# Patient Record
Sex: Female | Born: 1990 | Race: Black or African American | Hispanic: No | Marital: Single | Smoking: Never smoker
Health system: Southern US, Community
[De-identification: ages and names within clinical notes are randomized; demographics above are authoritative.]

## PROBLEM LIST (undated history)

## (undated) ENCOUNTER — Inpatient Hospital Stay (HOSPITAL_COMMUNITY): Payer: Self-pay

## (undated) ENCOUNTER — Emergency Department (HOSPITAL_COMMUNITY): Payer: Medicaid Other

## (undated) DIAGNOSIS — N898 Other specified noninflammatory disorders of vagina: Secondary | ICD-10-CM

## (undated) DIAGNOSIS — O0990 Supervision of high risk pregnancy, unspecified, unspecified trimester: Secondary | ICD-10-CM

## (undated) DIAGNOSIS — D649 Anemia, unspecified: Secondary | ICD-10-CM

## (undated) DIAGNOSIS — Z789 Other specified health status: Secondary | ICD-10-CM

## (undated) DIAGNOSIS — N390 Urinary tract infection, site not specified: Secondary | ICD-10-CM

## (undated) HISTORY — PX: NO PAST SURGERIES: SHX2092

## (undated) HISTORY — DX: Other specified noninflammatory disorders of vagina: N89.8

---
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
## 2012-03-08 NOTE — L&D Delivery Note (Signed)
Delivery Note At 3:40 PM a viable female was delivered via Vaginal, Spontaneous Delivery (Presentation: Left Occiput Anterior).  APGAR: 7, 9; weight 3 lb 15.9 oz (1810 g).   Placenta status: Intact, Spontaneous.  Cord: 3 vessels with the following complications: .  Anesthesia: None  Episiotomy: None Lacerations: None Suture Repair: none Est. Blood Loss (mL): 400  NSVD of viable preterm infant over intact perineum. Active management of 3rd stage with intact placenta and 3v cord. No tears no repair required. Hemostatic. EBL 400. Concern for possible abruption.  Mom to [facility] 3rd floor.  Baby to NICU.  Tawana Scale 11/24/2012, 5:12 PM

## 2012-07-22 ENCOUNTER — Inpatient Hospital Stay (HOSPITAL_COMMUNITY): Payer: Self-pay

## 2012-07-22 ENCOUNTER — Inpatient Hospital Stay (HOSPITAL_COMMUNITY)
Admission: AD | Admit: 2012-07-22 | Discharge: 2012-07-22 | Disposition: A | Discharge status: Home / Self Care | Payer: Medicaid Other | Source: Ambulatory Visit | Attending: Obstetrics & Gynecology | Admitting: Obstetrics & Gynecology

## 2012-07-22 ENCOUNTER — Encounter (HOSPITAL_COMMUNITY): Payer: Self-pay | Admitting: *Deleted

## 2012-07-22 DIAGNOSIS — B9689 Other specified bacterial agents as the cause of diseases classified elsewhere: Secondary | ICD-10-CM | POA: Insufficient documentation

## 2012-07-22 DIAGNOSIS — O4692 Antepartum hemorrhage, unspecified, second trimester: Secondary | ICD-10-CM

## 2012-07-22 DIAGNOSIS — N760 Acute vaginitis: Secondary | ICD-10-CM | POA: Insufficient documentation

## 2012-07-22 DIAGNOSIS — A499 Bacterial infection, unspecified: Secondary | ICD-10-CM

## 2012-07-22 DIAGNOSIS — O2390 Unspecified genitourinary tract infection in pregnancy, unspecified trimester: Secondary | ICD-10-CM | POA: Insufficient documentation

## 2012-07-22 DIAGNOSIS — O209 Hemorrhage in early pregnancy, unspecified: Secondary | ICD-10-CM | POA: Insufficient documentation

## 2012-07-22 LAB — URINALYSIS, ROUTINE W REFLEX MICROSCOPIC
Bilirubin Urine: NEGATIVE
Protein, ur: NEGATIVE mg/dL
Urobilinogen, UA: 0.2 mg/dL (ref 0.0–1.0)

## 2012-07-22 LAB — URINE MICROSCOPIC-ADD ON

## 2012-07-22 LAB — WET PREP, GENITAL
Trich, Wet Prep: NONE SEEN
Yeast Wet Prep HPF POC: NONE SEEN

## 2012-07-22 MED ORDER — METRONIDAZOLE 500 MG PO TABS: 500.0000 mg | ORAL_TABLET | Freq: Two times a day (BID) | ORAL | Status: DC

## 2012-07-22 NOTE — MAU Provider Note (Signed)
History     CSN: [id number]  Arrival date & time 07/22/12  1417   None     Chief Complaint  Patient presents with  . Vaginal Bleeding    (Consider location/radiation/quality/duration/timing/severity/associated sxs/prior treatment) HPI [name] is a 22 y.o. G2P0010 at [redacted]w[redacted]d. She presents with c/o bleeding since last night. Is right red, on tissue with wiping, not enough for a pad. No clots, no cramping. No other changes in discharge, odor or itching. No UTI S&S.  Last intercourse 5/15, same partner x 7 m, no STD screen yet. Has applied for Medicaid,waiting for her card. Plans [facility] with Dr Pennie Rushing.  Past Medical History  Diagnosis Date  . No pertinent past medical history     Past Surgical History  Procedure Laterality Date  . No past surgeries      History reviewed. No pertinent family history.  History  Substance Use Topics  . Smoking status: Never Smoker   . Smokeless tobacco: Not on file  . Alcohol Use: No    OB History   Grav Para Term Preterm Abortions TAB SAB Ect Mult Living   2 0 0 0 1 0 1 0 0 0       Review of Systems  Constitutional: Negative for fever and chills.  Gastrointestinal: Negative for nausea, vomiting and abdominal pain.  Genitourinary: Positive for vaginal bleeding. Negative for dysuria, urgency, frequency and vaginal discharge.    Allergies  Review of patient's allergies indicates no known allergies.  Home Medications  No current outpatient prescriptions on file.  BP 121/68  Pulse 92  Temp(Src) 97.6 F (36.4 C) (Oral)  Resp 18  Ht 5\' 2"  (1.575 m)  Wt 100 lb (45.36 kg)  BMI 18.29 kg/m2  LMP 04/04/2012  Physical Exam  Constitutional: She is oriented to person, place, and time. She appears well-developed and well-nourished.  Abdominal: Soft. There is no tenderness.  Genitourinary:  Pelvic exam: Vulva- nl anatomy, skin intact Vagina- mod amt rose colored thick discharge with odor Cx- closed Uterus- 14-16 wk size Adn- non  tender  Musculoskeletal: Normal range of motion.  Neurological: She is alert and oriented to person, place, and time.  Skin: Skin is warm and dry.  Psychiatric: She has a normal mood and affect. Her behavior is normal.    ED Course  Procedures (including critical care time)  Labs Reviewed  URINALYSIS, ROUTINE W REFLEX MICROSCOPIC - Abnormal; Notable for the following:    Hgb urine dipstick LARGE (*)    All other components within normal limits  URINE MICROSCOPIC-ADD ON - Abnormal; Notable for the following:    Squamous Epithelial / LPF FEW (*)    Bacteria, UA MANY (*)    All other components within normal limits  URINE CULTURE  WET PREP, GENITAL  GC/CHLAMYDIA PROBE AMP   No results found.  Results for orders placed during the hospital encounter of 07/22/12 (from the past 24 hour(s))  URINALYSIS, ROUTINE W REFLEX MICROSCOPIC     Status: Abnormal   Collection Time    07/22/12  2:35 PM      Result Value Range   Color, Urine YELLOW  YELLOW   APPearance CLEAR  CLEAR   Specific Gravity, Urine 1.015  1.005 - 1.030   pH 8.0  5.0 - 8.0   Glucose, UA NEGATIVE  NEGATIVE mg/dL   Hgb urine dipstick LARGE (*) NEGATIVE   Bilirubin Urine NEGATIVE  NEGATIVE   Ketones, ur NEGATIVE  NEGATIVE mg/dL   Protein, ur NEGATIVE  NEGATIVE mg/dL   Urobilinogen, UA 0.2  0.0 - 1.0 mg/dL   Nitrite NEGATIVE  NEGATIVE   Leukocytes, UA NEGATIVE  NEGATIVE  URINE MICROSCOPIC-ADD ON     Status: Abnormal   Collection Time    07/22/12  2:35 PM      Result Value Range   Squamous Epithelial / LPF FEW (*) RARE   WBC, UA 7-10  <3 WBC/hpf   RBC / HPF 3-6  <3 RBC/hpf   Bacteria, UA MANY (*) RARE  WET PREP, GENITAL     Status: Abnormal   Collection Time    07/22/12  3:50 PM      Result Value Range   Yeast Wet Prep HPF POC NONE SEEN  NONE SEEN   Trich, Wet Prep NONE SEEN  NONE SEEN   Clue Cells Wet Prep HPF POC MANY (*) NONE SEEN   WBC, Wet Prep HPF POC FEW (*) NONE SEEN    No diagnosis found.  U/S 16  2/7 wks , EDC 10/30 by measurements. 15 4/7 by dates, [facility] 01/09/13. Anterior placenta, no SCH seen.    MDM  ASSESSMENT: Bleeding @ 15 4/7 wks Bacterial vaginosis Cultures pending  PLAN:  Metronidazole 500 mg bid x 7 d Pelvic rest She will be notified of results Make appt for [facility] when gets Medicaid card

## 2012-07-22 NOTE — MAU Provider Note (Signed)
Attestation of Attending Supervision of Advanced Practitioner (CNM/NP): Evaluation and management procedures were performed by the Advanced Practitioner under my supervision and collaboration.  I have reviewed the Advanced Practitioner's note and chart, and I agree with the management and plan.  HARRAWAY-SMITH, Mahala Rommel 7:02 PM

## 2012-07-22 NOTE — MAU Note (Signed)
Pt presents with complaints of bright red vaginal bleeding since last night around midnight. Pt denies any cramping.

## 2012-07-24 ENCOUNTER — Other Ambulatory Visit: Payer: Self-pay | Admitting: Obstetrics & Gynecology

## 2012-07-24 ENCOUNTER — Telehealth: Payer: Self-pay | Admitting: General Practice

## 2012-07-24 DIAGNOSIS — N390 Urinary tract infection, site not specified: Secondary | ICD-10-CM

## 2012-07-24 LAB — URINE CULTURE: Colony Count: >=100000

## 2012-07-24 LAB — GC/CHLAMYDIA PROBE AMP
CT Probe RNA: NEGATIVE
GC Probe RNA: NEGATIVE

## 2012-07-24 MED ORDER — CEPHALEXIN 500 MG PO CAPS: 500.0000 mg | ORAL_CAPSULE | Freq: Four times a day (QID) | ORAL | Status: DC

## 2012-07-24 NOTE — Telephone Encounter (Signed)
Message copied by Kathee Delton on Mon Jul 24, 2012  4:46 PM ------      Message from: Willodean Rosenthal      Created: Mon Jul 24, 2012  3:21 PM       Pt with UTI.  Please notify.  Needs to pick up Rx.      thx            clh-[id number] ------

## 2012-07-24 NOTE — Telephone Encounter (Signed)
Called patient at home number and person answered saying this was the wrong number, called patient at mobile number and voicemail box said it had not been set up yet

## 2012-07-26 ENCOUNTER — Encounter: Payer: Self-pay | Admitting: General Practice

## 2012-07-26 NOTE — Telephone Encounter (Addendum)
Called patient, no answer on mobile number phone and message stated this voicemail box had not been set up yet. Will send letter.

## 2012-08-13 ENCOUNTER — Encounter (HOSPITAL_COMMUNITY): Payer: Self-pay | Admitting: Family

## 2012-08-13 ENCOUNTER — Inpatient Hospital Stay (HOSPITAL_COMMUNITY)
Admission: AD | Admit: 2012-08-13 | Discharge: 2012-08-13 | Disposition: A | Discharge status: Home / Self Care | Payer: Self-pay | Source: Ambulatory Visit | Attending: Obstetrics & Gynecology | Admitting: Obstetrics & Gynecology

## 2012-08-13 DIAGNOSIS — B373 Candidiasis of vulva and vagina: Secondary | ICD-10-CM | POA: Insufficient documentation

## 2012-08-13 DIAGNOSIS — O2390 Unspecified genitourinary tract infection in pregnancy, unspecified trimester: Secondary | ICD-10-CM | POA: Insufficient documentation

## 2012-08-13 DIAGNOSIS — B3731 Acute candidiasis of vulva and vagina: Secondary | ICD-10-CM | POA: Insufficient documentation

## 2012-08-13 DIAGNOSIS — L293 Anogenital pruritus, unspecified: Secondary | ICD-10-CM | POA: Insufficient documentation

## 2012-08-13 HISTORY — DX: Urinary tract infection, site not specified: N39.0

## 2012-08-13 LAB — URINALYSIS, ROUTINE W REFLEX MICROSCOPIC
Bilirubin Urine: NEGATIVE
Nitrite: NEGATIVE
Protein, ur: NEGATIVE mg/dL
Specific Gravity, Urine: 1.01 (ref 1.005–1.030)
Urobilinogen, UA: 0.2 mg/dL (ref 0.0–1.0)

## 2012-08-13 LAB — URINE MICROSCOPIC-ADD ON

## 2012-08-13 LAB — WET PREP, GENITAL: Trich, Wet Prep: NONE SEEN

## 2012-08-13 MED ORDER — FLUCONAZOLE 150 MG PO TABS: 150.0000 mg | ORAL_TABLET | Freq: Once | ORAL | Status: DC

## 2012-08-13 NOTE — MAU Note (Signed)
Pt presents with complaints of vaginal itching and redness in her vaginal area.

## 2012-08-13 NOTE — MAU Provider Note (Signed)
Attestation of Attending Supervision of Advanced Practitioner (CNM/NP): Evaluation and management procedures were performed by the Advanced Practitioner under my supervision and collaboration.  I have reviewed the Advanced Practitioner's note and chart, and I agree with the management and plan.  HARRAWAY-SMITH, Rayner Erman 5:52 PM      

## 2012-08-13 NOTE — MAU Provider Note (Signed)
History     CSN: [id number]  Arrival date and time: 08/13/12 1411   First Provider Initiated Contact with Patient 08/13/12 1436      Chief Complaint  Patient presents with  . Vaginal Itching   HPI [name] is a 22 y.o. G2P0010 at [redacted]w[redacted]d who presents to MAU today with complaint of vaginal discharge x 1 week. The patient states that it is thick, white and odorless. She also has associated itching and irritation. She denies abdominal pain, vaginal bleeding, contractions, UTI symptoms, fever or N/V. She reports good fetal movement.   OB History   Grav Para Term Preterm Abortions TAB SAB Ect Mult Living   2 0 0 0 1 0 1 0 0 0       Past Medical History  Diagnosis Date  . No pertinent past medical history   . UTI (lower urinary tract infection)     Past Surgical History  Procedure Laterality Date  . No past surgeries      History reviewed. No pertinent family history.  History  Substance Use Topics  . Smoking status: Never Smoker   . Smokeless tobacco: Not on file  . Alcohol Use: No    Allergies: No Known Allergies  No prescriptions prior to admission    Review of Systems  Constitutional: Negative for fever and malaise/fatigue.  Gastrointestinal: Negative for nausea, vomiting, abdominal pain, diarrhea and constipation.  Genitourinary: Negative for dysuria, urgency and frequency.       + vaginal discharge Neg - vaginal bleeding  Neurological: Negative for dizziness.   Physical Exam   Blood pressure 99/69, pulse 91, temperature 97.1 F (36.2 C), temperature source Oral, resp. rate 16, last menstrual period 04/04/2012.  Physical Exam  Constitutional: She is oriented to person, place, and time. She appears well-developed and well-nourished. No distress.  HENT:  Head: Normocephalic and atraumatic.  Cardiovascular: Normal rate, regular rhythm and normal heart sounds.   Respiratory: Effort normal and breath sounds normal. No respiratory distress.  GI:  Soft. Bowel sounds are normal. She exhibits no distension and no mass. There is no tenderness. There is no rebound and no guarding.  Genitourinary: Uterus is enlarged (appropriate for GA). Uterus is not tender. Cervix exhibits no motion tenderness, no discharge and no friability. No bleeding around the vagina. Vaginal discharge (moderate amount of thick white discharge noted) found.  Neurological: She is alert and oriented to person, place, and time.  Skin: Skin is warm and dry. No erythema.  Psychiatric: She has a normal mood and affect.   Results for orders placed during the hospital encounter of 08/13/12 (from the past 24 hour(s))  URINALYSIS, ROUTINE W REFLEX MICROSCOPIC     Status: Abnormal   Collection Time    08/13/12  2:25 PM      Result Value Range   Color, Urine YELLOW  YELLOW   APPearance CLEAR  CLEAR   Specific Gravity, Urine 1.010  1.005 - 1.030   pH 7.0  5.0 - 8.0   Glucose, UA 100 (*) NEGATIVE mg/dL   Hgb urine dipstick NEGATIVE  NEGATIVE   Bilirubin Urine NEGATIVE  NEGATIVE   Ketones, ur NEGATIVE  NEGATIVE mg/dL   Protein, ur NEGATIVE  NEGATIVE mg/dL   Urobilinogen, UA 0.2  0.0 - 1.0 mg/dL   Nitrite NEGATIVE  NEGATIVE   Leukocytes, UA TRACE (*) NEGATIVE  URINE MICROSCOPIC-ADD ON     Status: Abnormal   Collection Time    08/13/12  2:25 PM  Result Value Range   Squamous Epithelial / LPF FEW (*) RARE   WBC, UA 0-2  <3 WBC/hpf  WET PREP, GENITAL     Status: Abnormal   Collection Time    08/13/12  2:40 PM      Result Value Range   Yeast Wet Prep HPF POC FEW (*) NONE SEEN   Trich, Wet Prep NONE SEEN  NONE SEEN   Clue Cells Wet Prep HPF POC NONE SEEN  NONE SEEN   WBC, Wet Prep HPF POC MODERATE (*) NONE SEEN    MAU Course  Procedures None  MDM UA and Wet prep today  Assessment and Plan  A: Yeast vaginitis  P: Discharge home Rx for Diflucan #2 sent to patient's pharmacy Patient encouraged to keep appointment to start care with [facility] when Medicaid is  approved Patient may return to MAU as needed  Freddi Starr, PA-C  08/13/2012, 3:49 PM

## 2012-08-13 NOTE — MAU Note (Addendum)
Patient presents to MAU with c/o vaginal itching and white discharge x 1 week. Denies vaginal bleeding, cramping. Reports +FM.

## 2012-08-14 LAB — GC/CHLAMYDIA PROBE AMP: GC Probe RNA: NEGATIVE

## 2012-08-28 ENCOUNTER — Encounter (HOSPITAL_COMMUNITY): Payer: Self-pay

## 2012-08-28 ENCOUNTER — Inpatient Hospital Stay (HOSPITAL_COMMUNITY)
Admission: AD | Admit: 2012-08-28 | Discharge: 2012-08-28 | Disposition: A | Discharge status: Home / Self Care | Payer: Self-pay | Source: Ambulatory Visit | Attending: Family Medicine | Admitting: Family Medicine

## 2012-08-28 DIAGNOSIS — O99891 Other specified diseases and conditions complicating pregnancy: Secondary | ICD-10-CM | POA: Insufficient documentation

## 2012-08-28 DIAGNOSIS — R109 Unspecified abdominal pain: Secondary | ICD-10-CM | POA: Insufficient documentation

## 2012-08-28 DIAGNOSIS — N949 Unspecified condition associated with female genital organs and menstrual cycle: Secondary | ICD-10-CM

## 2012-08-28 LAB — URINALYSIS, ROUTINE W REFLEX MICROSCOPIC
Glucose, UA: 500 mg/dL — AB
Leukocytes, UA: NEGATIVE
Nitrite: NEGATIVE
Protein, ur: NEGATIVE mg/dL
Urobilinogen, UA: 0.2 mg/dL (ref 0.0–1.0)

## 2012-08-28 MED ORDER — CYCLOBENZAPRINE HCL 10 MG PO TABS: 10.0000 mg | ORAL_TABLET | Freq: Two times a day (BID) | ORAL | Status: DC | PRN

## 2012-08-28 MED ORDER — ABDOMINAL BINDER/ELASTIC SMALL MISC: 1.0000 [IU] | Freq: Every day | Status: DC | PRN

## 2012-08-28 NOTE — MAU Note (Signed)
Patient is in with c/o bilateral waist pain and wants to be reassured of fetal well being, awaiting for her insurance to start prenatal care. She denies any cramping, vaginal bleeding or lof. She reports feeling fetal movement. She is pleased when she heard fht at triage.

## 2012-08-28 NOTE — MAU Provider Note (Signed)
History     CSN: [id number]  Arrival date and time: 08/28/12 1630   First Provider Initiated Contact with Patient 08/28/12 1730      Chief Complaint  Patient presents with  . Abdominal Pain   HPI [name] is a 22 y.o. G2P0010 at [redacted]w[redacted]d who presents to MAU today with complaint of lower abdominal pain x 2 days. The pain comes and goes. It is sharp and bilateral. It is 5/10 at the worst. She has no pain now. Pain is worse with ambulation and relieved by rest. She has not tried any pain medications yet. She denies vaginal bleeding, discharge, LOF, N/V/D, UTi symptoms, contractions or fever. She reports good fetal movement.   OB History   Grav Para Term Preterm Abortions TAB SAB Ect Mult Living   2 0 0 0 1 0 1 0 0 0       Past Medical History  Diagnosis Date  . No pertinent past medical history   . UTI (lower urinary tract infection)     Past Surgical History  Procedure Laterality Date  . No past surgeries      No family history on file.  History  Substance Use Topics  . Smoking status: Never Smoker   . Smokeless tobacco: Not on file  . Alcohol Use: No    Allergies: No Known Allergies  Prescriptions prior to admission  Medication Sig Dispense Refill  . acetaminophen (TYLENOL) 325 MG tablet Take 650 mg by mouth every 6 (six) hours as needed for fever.      . cephALEXin (KEFLEX) 500 MG capsule Take 1 capsule (500 mg total) by mouth 4 (four) times daily.  28 capsule  2  . fluconazole (DIFLUCAN) 150 MG tablet Take 1 tablet (150 mg total) by mouth once.  1 tablet  1  . metroNIDAZOLE (FLAGYL) 500 MG tablet Take 1 tablet (500 mg total) by mouth 2 (two) times daily.  14 tablet  0    Review of Systems  Constitutional: Negative for fever and malaise/fatigue.  Gastrointestinal: Positive for abdominal pain. Negative for nausea, vomiting, diarrhea and constipation.  Genitourinary: Negative for dysuria, urgency and frequency.       Neg - vaginal bleeding, discharge, LOF    Physical Exam   Blood pressure 107/57, pulse 85, temperature 98.5 F (36.9 C), temperature source Oral, resp. rate 16, height 5\' 3"  (1.6 m), weight 105 lb (47.628 kg), last menstrual period 04/04/2012, SpO2 100.00%.  Physical Exam  Constitutional: She is oriented to person, place, and time. She appears well-developed and well-nourished. No distress.  HENT:  Head: Normocephalic and atraumatic.  Cardiovascular: Normal rate, regular rhythm and normal heart sounds.   Respiratory: Effort normal and breath sounds normal. No respiratory distress.  GI: Soft. Bowel sounds are normal. She exhibits no distension and no mass. There is tenderness (mild tenderness to palpation of the lower abdomen). There is no rebound and no guarding.  Neurological: She is alert and oriented to person, place, and time.  Skin: Skin is warm and dry. No erythema.  Psychiatric: She has a normal mood and affect.   Results for orders placed during the hospital encounter of 08/28/12 (from the past 24 hour(s))  URINALYSIS, ROUTINE W REFLEX MICROSCOPIC     Status: Abnormal   Collection Time    08/28/12  4:50 PM      Result Value Range   Color, Urine YELLOW  YELLOW   APPearance CLEAR  CLEAR   Specific Gravity, Urine 1.020  1.005 - 1.030   pH 7.0  5.0 - 8.0   Glucose, UA 500 (*) NEGATIVE mg/dL   Hgb urine dipstick NEGATIVE  NEGATIVE   Bilirubin Urine NEGATIVE  NEGATIVE   Ketones, ur NEGATIVE  NEGATIVE mg/dL   Protein, ur NEGATIVE  NEGATIVE mg/dL   Urobilinogen, UA 0.2  0.0 - 1.0 mg/dL   Nitrite NEGATIVE  NEGATIVE   Leukocytes, UA NEGATIVE  NEGATIVE    MAU Course  Procedures None  MDM +FHTs UA today  Assessment and Plan  A: Round ligament pain  P: Discharge home Rx for Flexeril and abdominal binder sent to patient's pharmacy Recommended Tylenol PRN pain and warm baths for relief Patient encouraged to schedule with [facility] as soon as possible. If they refuse to take patient as she is > [redacted] weeks GA, [facility]  clinic contact information given Patient may return to MAU as needed or if her condition were to change or worsen  Freddi Starr, PA-C  08/28/2012, 5:30 PM

## 2012-08-28 NOTE — MAU Provider Note (Signed)
Chart reviewed and agree with management and plan.  

## 2012-08-28 NOTE — MAU Note (Signed)
Patient states she has been having pain on both sides at the waist level for 2 days. Patient has not had prenatal care but plans to go to [facility]. Patient denies bleeding or leaking. Has felt fetal movement.

## 2012-09-18 ENCOUNTER — Encounter: Payer: Medicaid Other | Admitting: Obstetrics & Gynecology

## 2012-09-18 ENCOUNTER — Telehealth: Payer: Self-pay | Admitting: Obstetrics & Gynecology

## 2012-09-18 NOTE — Telephone Encounter (Signed)
Called patient at all numbers in chart. No phone is active right now.

## 2012-09-25 ENCOUNTER — Ambulatory Visit (INDEPENDENT_AMBULATORY_CARE_PROVIDER_SITE_OTHER): Payer: Self-pay | Admitting: Obstetrics & Gynecology

## 2012-09-25 ENCOUNTER — Encounter: Payer: Self-pay | Admitting: Obstetrics & Gynecology

## 2012-09-25 VITALS — BP 100/67 | Temp 98.6°F | Wt 108.0 lb

## 2012-09-25 DIAGNOSIS — O0992 Supervision of high risk pregnancy, unspecified, second trimester: Secondary | ICD-10-CM | POA: Insufficient documentation

## 2012-09-25 DIAGNOSIS — O09292 Supervision of pregnancy with other poor reproductive or obstetric history, second trimester: Secondary | ICD-10-CM

## 2012-09-25 DIAGNOSIS — O09299 Supervision of pregnancy with other poor reproductive or obstetric history, unspecified trimester: Secondary | ICD-10-CM

## 2012-09-25 DIAGNOSIS — O3430 Maternal care for cervical incompetence, unspecified trimester: Secondary | ICD-10-CM

## 2012-09-25 DIAGNOSIS — D649 Anemia, unspecified: Secondary | ICD-10-CM

## 2012-09-25 DIAGNOSIS — O09293 Supervision of pregnancy with other poor reproductive or obstetric history, third trimester: Secondary | ICD-10-CM | POA: Insufficient documentation

## 2012-09-25 LAB — POCT URINALYSIS DIP (DEVICE)
Glucose, UA: NEGATIVE mg/dL
Nitrite: NEGATIVE
Urobilinogen, UA: 0.2 mg/dL (ref 0.0–1.0)

## 2012-09-25 LAB — FERRITIN: Ferritin: 7 ng/mL — ABNORMAL LOW (ref 10–291)

## 2012-09-25 MED ORDER — BETAMETHASONE SOD PHOS & ACET 6 (3-3) MG/ML IJ SUSP
12.0000 mg | Freq: Once | INTRAMUSCULAR | Status: AC
  Administered 2012-09-25: 12 mg via INTRAMUSCULAR

## 2012-09-25 MED ORDER — PROGESTERONE MICRONIZED 100 MG PO CAPS: 100.0000 mg | ORAL_CAPSULE | Freq: Every day | ORAL | Status: DC

## 2012-09-25 NOTE — Progress Notes (Signed)
   Subjective:    [name] is a G2P0100 [redacted]w[redacted]d being seen today for her first obstetrical visit.  Her obstetrical history is significant for PPROM at 19 weeks 3 days--delivered at [facility] in 2012. Patient is considering breast feeding. Pregnancy history fully reviewed.  Patient reports no bleeding, no contractions, no cramping and no leaking.  Filed Vitals:   09/25/12 0855  BP: 100/67  Temp: 98.6 F (37 C)  Weight: 108 lb (48.988 kg)    HISTORY: OB History   Grav Para Term Preterm Abortions TAB SAB Ect Mult Living   2 1 0 1 0 0 0 0 0 0      # Outc Date GA Lbr Len/2nd Wgt Sex Del Anes PTL Lv   1 PRE        No SB   Comments: PPROM at 19 weeks 3 days; ??incomop cervix-did not labor at home, came in with membranes ruptured.   2 CUR              Past Medical History  Diagnosis Date  . No pertinent past medical history   . UTI (lower urinary tract infection)    Past Surgical History  Procedure Laterality Date  . No past surgeries     History reviewed. No pertinent family history.   Exam    Uterus:     Pelvic Exam:    Perineum: No Hemorrhoids   Vulva: normal   Vagina:  normal mucosa, normal discharge   pH: n/a   Cervix: 1/40/-3   Adnexa: no masses   Bony Pelvis: gynecoid  System: Breast:  normal appearance, no masses or tenderness   Skin: normal coloration and turgor, no rashes    Neurologic: normal, normal mood, oriented   Extremities: no erythema, induration, or nodules   HEENT sclera clear, anicteric, oropharynx clear, no lesions and thyroid without masses   Mouth/Teeth mucous membranes moist, pharynx normal without lesions and dental hygiene good   Neck supple and no masses   Cardiovascular: regular rate and rhythm   Respiratory:  appears well, vitals normal, no respiratory distress, acyanotic, normal RR, chest clear, no wheezing, crepitations, rhonchi, normal symmetric air entry   Abdomen: soft, nt   Urinary: urethral meatus normal      Assessment:     Pregnancy: G2P0100 There are no active problems to display for this patient.       Plan:     Initial labs drawn. Prenatal vitamins. Problem list reviewed and updated. Genetic Screening discussed too late (24 weeks  Ultrasound discussed; fetal survey: ordered.  Will get TV US for cervical length in one week  Will get rpt Betamethasone and ffn in one day Progesterone nightly for PPROM history and cervix open to 1 cm.    Senaida Chilcote H. 09/25/2012

## 2012-09-25 NOTE — Progress Notes (Signed)
Pulse 82 Vaginal d/c stated as white itch with occasional itch.

## 2012-09-25 NOTE — Addendum Note (Signed)
Addended by: Toula Moos on: 09/25/2012 12:22 PM   Modules accepted: Orders

## 2012-09-25 NOTE — Addendum Note (Signed)
Addended by: Toula Moos on: 09/25/2012 04:28 PM   Modules accepted: Orders

## 2012-09-25 NOTE — Progress Notes (Signed)
Ultrasound scheduled for 10/02/12 at 8:30 am.

## 2012-09-25 NOTE — Progress Notes (Signed)
Nutrition Note:  1st visit Pt. With PPROM in previous pregnancy.  Underweight prior to pregnancy with poor wt. Gain thus far.  Delayed prenatal care. Pt. Reports no N/V; good appetite; 3 meals and 2-3 snacks/day.  Considering Breastfeeding.  Exposure to second hand smoke - mom smokes inside the home. Certified for [facility] today. Discussed wt. Gain goals of 28-40# F/U 2-4 weeks. Candice C. Earlene Plater, MPH, RD, LDN

## 2012-09-26 ENCOUNTER — Ambulatory Visit (INDEPENDENT_AMBULATORY_CARE_PROVIDER_SITE_OTHER): Payer: Self-pay | Admitting: Obstetrics and Gynecology

## 2012-09-26 ENCOUNTER — Encounter: Payer: Self-pay | Admitting: Obstetrics and Gynecology

## 2012-09-26 ENCOUNTER — Other Ambulatory Visit: Payer: Self-pay | Admitting: Obstetrics & Gynecology

## 2012-09-26 VITALS — BP 100/71 | Temp 97.8°F | Wt 108.8 lb

## 2012-09-26 DIAGNOSIS — O0992 Supervision of high risk pregnancy, unspecified, second trimester: Secondary | ICD-10-CM

## 2012-09-26 DIAGNOSIS — O3430 Maternal care for cervical incompetence, unspecified trimester: Secondary | ICD-10-CM

## 2012-09-26 DIAGNOSIS — O09292 Supervision of pregnancy with other poor reproductive or obstetric history, second trimester: Secondary | ICD-10-CM

## 2012-09-26 DIAGNOSIS — O09299 Supervision of pregnancy with other poor reproductive or obstetric history, unspecified trimester: Secondary | ICD-10-CM

## 2012-09-26 LAB — OBSTETRIC PANEL
Basophils Absolute: 0 10*3/uL (ref 0.0–0.1)
Basophils Relative: 0 % (ref 0–1)
Lymphocytes Relative: 16 % (ref 12–46)
MCHC: 33.5 g/dL (ref 30.0–36.0)
Neutro Abs: 7.5 10*3/uL (ref 1.7–7.7)
Platelets: 275 10*3/uL (ref 150–400)
RDW: 13.8 % (ref 11.5–15.5)
Rubella: 1.12 Index — ABNORMAL HIGH (ref <0.90)
WBC: 9.6 10*3/uL (ref 4.0–10.5)

## 2012-09-26 LAB — FOLATE RBC

## 2012-09-26 LAB — FETAL FIBRONECTIN: Fetal Fibronectin: POSITIVE — AB

## 2012-09-26 MED ORDER — BETAMETHASONE SOD PHOS & ACET 6 (3-3) MG/ML IJ SUSP
12.0000 mg | Freq: Once | INTRAMUSCULAR | Status: AC
  Administered 2012-09-26: 12 mg via INTRAMUSCULAR

## 2012-09-26 MED ORDER — PROGESTERONE MICRONIZED 100 MG PO CAPS: ORAL_CAPSULE | ORAL | Status: DC

## 2012-09-26 MED ORDER — FERROUS SULFATE ER 143 (45 FE) MG PO TBCR: 1.0000 | EXTENDED_RELEASE_TABLET | Freq: Every day | ORAL | Status: DC

## 2012-09-26 NOTE — Progress Notes (Signed)
Histroy of anemia.  Hgb 10's.  Ferritin 7.  Treat with daily OTC iron.

## 2012-09-26 NOTE — Progress Notes (Signed)
P = 90    Pt had prenatal visit yesterday - here for FNN and 2nd betamethasone injection only.  Pt denies problems from injection yesterday.

## 2012-09-26 NOTE — Addendum Note (Signed)
Addended by: Jill Side on: 09/26/2012 11:02 AM   Modules accepted: Orders

## 2012-09-26 NOTE — Progress Notes (Signed)
Patient returns for FFN collection and second dose of betamethasone. Patient currently without any complaints. Suffering from nausea and emesis. FFN collected. Cervical exam unchanged from yesterday. Patient advised to start using prometrium vaginally and to take iron supplements. Preterm labor precautions reviewed.

## 2012-09-27 ENCOUNTER — Telehealth: Payer: Self-pay | Admitting: *Deleted

## 2012-09-27 LAB — HEMOGLOBINOPATHY EVALUATION
Hemoglobin Other: 0 %
Hgb A2 Quant: 2.4 % (ref 2.2–3.2)
Hgb F Quant: 0 % (ref 0.0–2.0)
Hgb S Quant: 0 %

## 2012-09-27 NOTE — Telephone Encounter (Addendum)
Message copied by Jill Side on Wed Sep 27, 2012 10:22 AM ------      Message from: CONSTANT, PEGGY      Created: Wed Sep 27, 2012  9:25 AM       Patient with positive FFN. Completed betamethasone on 7/22. Asymptomatic on 7/21 and 7/22 when seen by a provider.  ------ Called pt and informed her of test results.  PTL sx and precautions reviewed. Pt voiced understanding of all information given.

## 2012-09-28 ENCOUNTER — Encounter: Payer: Self-pay | Admitting: *Deleted

## 2012-10-02 ENCOUNTER — Other Ambulatory Visit: Payer: Self-pay | Admitting: Obstetrics & Gynecology

## 2012-10-02 ENCOUNTER — Encounter (HOSPITAL_COMMUNITY): Payer: Self-pay | Admitting: *Deleted

## 2012-10-02 ENCOUNTER — Ambulatory Visit (HOSPITAL_COMMUNITY)
Admission: RE | Admit: 2012-10-02 | Discharge: 2012-10-02 | Disposition: A | Discharge status: Home / Self Care | Payer: MEDICAID | Source: Ambulatory Visit | Attending: Obstetrics & Gynecology | Admitting: Obstetrics & Gynecology

## 2012-10-02 ENCOUNTER — Inpatient Hospital Stay (HOSPITAL_COMMUNITY)
Admission: AD | Admit: 2012-10-02 | Discharge: 2012-10-02 | Disposition: A | Discharge status: Home / Self Care | Payer: Self-pay | Source: Ambulatory Visit | Attending: Family Medicine | Admitting: Family Medicine

## 2012-10-02 DIAGNOSIS — O26879 Cervical shortening, unspecified trimester: Secondary | ICD-10-CM | POA: Insufficient documentation

## 2012-10-02 DIAGNOSIS — O3432 Maternal care for cervical incompetence, second trimester: Secondary | ICD-10-CM

## 2012-10-02 DIAGNOSIS — Z8751 Personal history of pre-term labor: Secondary | ICD-10-CM | POA: Insufficient documentation

## 2012-10-02 DIAGNOSIS — O3440 Maternal care for other abnormalities of cervix, unspecified trimester: Secondary | ICD-10-CM

## 2012-10-02 DIAGNOSIS — O4700 False labor before 37 completed weeks of gestation, unspecified trimester: Secondary | ICD-10-CM | POA: Insufficient documentation

## 2012-10-02 DIAGNOSIS — O0992 Supervision of high risk pregnancy, unspecified, second trimester: Secondary | ICD-10-CM

## 2012-10-02 MED ORDER — NIFEDIPINE 10 MG PO CAPS
10.0000 mg | ORAL_CAPSULE | ORAL | Status: AC
  Administered 2012-10-02 (×3): 10 mg via ORAL
  Filled 2012-10-02 (×3): qty 1

## 2012-10-02 MED ORDER — PROGESTERONE MICRONIZED 200 MG PO CAPS: ORAL_CAPSULE | ORAL | Status: DC

## 2012-10-02 MED ORDER — NIFEDIPINE ER OSMOTIC RELEASE 30 MG PO TB24: 30.0000 mg | ORAL_TABLET | Freq: Two times a day (BID) | ORAL | Status: DC

## 2012-10-02 NOTE — MAU Provider Note (Signed)
History     CSN: [id number]  Arrival date and time: 10/02/12 4098   First Provider Initiated Contact with Patient 10/02/12 1102      Chief Complaint  Patient presents with  . Laboring   HPI 22 y.o. G2P0100 at [redacted]w[redacted]d presents from ultrasound for short cervix (0.5cm to 1.1cm with 1.3cm of funneling).  Says she does not feel any contractions currently, denies loss of fluid, bleeding, or any vaginal discharge. +FM in the last hour. She has been treated with 2 does of betamethasone (last dose 7/22) and using prometrium nightly since 7/22 because at her prenatal visit her cervix was 1cm and she has a history of PPROM at 19wks. Denies chest pain, shortness of breath, nausea/vomiting.  Prenatal course: Care at [facility] - Too late for genetic screen - Fetal anatomy normal, cervix 1.1-0.5cm on ultrasound  OB History   Grav Para Term Preterm Abortions TAB SAB Ect Mult Living   2 1 0 1 0 0 0 0 0 0     Previous PPROM at [redacted]w[redacted]d  Past Medical History  Diagnosis Date  . No pertinent past medical history   . UTI (lower urinary tract infection)     Past Surgical History  Procedure Laterality Date  . No past surgeries      History reviewed. No pertinent family history.  History  Substance Use Topics  . Smoking status: Never Smoker   . Smokeless tobacco: Never Used  . Alcohol Use: No    Allergies: No Known Allergies  Prescriptions prior to admission  Medication Sig Dispense Refill  . Ferrous Sulfate ([employer] SLOW RELEASE IRON) 143 (45 FE) MG TBCR Take 1 tablet by mouth daily.  30 tablet  6  . Prenatal Vit-Fe Fumarate-FA (PRENATAL MULTIVITAMIN) TABS Take 1 tablet by mouth daily at 12 noon.      . progesterone (PROMETRIUM) 100 MG capsule Insert 1 capsule per vagina daily at bedtime  30 capsule  6  . Elastic Bandages & Supports (ABDOMINAL BINDER/ELASTIC SMALL) MISC 1 Units by Does not apply route daily as needed.  1 each  0    ROS negative except as above Physical Exam   Blood pressure  108/68, pulse 87, temperature 98.9 F (37.2 C), resp. rate 18, height 5\' 3"  (1.6 m), weight 49.102 kg (108 lb 4 oz), last menstrual period 04/04/2012.  Physical Exam General appearance: alert, cooperative, no distress and slightly nervous Head: Normocephalic, without obvious abnormality, atraumatic Lungs: clear to auscultation bilaterally Heart: regular rate and rhythm, S1, S2 normal, no murmur, click, rub or gallop Abdomen: gravid, nontender, fundal height 25cm Pelvic: cervix normal in appearance, external genitalia normal and vagina normal without discharge Extremities: no edema, redness or tenderness in the calves or thighs Pulses: 2+ and symmetric DP Skin: warm and dry  Dilation: 1 Effacement (%): 40 Station: Ballotable Exam by:: Reola Calkins, MD  Re-check 2 hours later Dilation: 1 Effacement (%): 40 Station: Ballotable Exam by:: Dr. Waynetta Sandy  FHT: 145bpm, moderate variability, accels present, no decels. Small period of marked variability Toco: irregular contractions, q2-31min  MAU Course  Procedures  MDM Procardia 10mg  q36min x3 Re-check cervix, unchanged 1/40/ballotable  Assessment and Plan  22 y.o. G2P0100 at [redacted]w[redacted]d  Short cervix - Treated with 2x BMZ, last dose 7/22 - Giving procardia 10mg  x3 in MAU, contractions significantly reduced - On prometrium, will increase to from 100 to 200mg  at night - Start procardia 30mg  XL BID until 36wks - Follow up on Tuesday  Tawni Carnes 10/02/2012, 12:12 PM  I have seen and examined this patient and agree with above documentation in the resident's note.   Pt is at [redacted]w[redacted]d and here with incidentally found cervical insufficiency as above. Already s/p BMZ and on prometrium.  TOCO initially showing irregular ctx q2-6min. Procardia given with improvement in contraction pattern but still with irritability. Pt never aware of any of these contractions.  SVE unchanged after 2 hours and unchanged from clinic check last week.  Increased prometrium  to 200mg  PV nightly and start procardia.  Pt to f/u on Tuesday as scheduled.   Rulon Abide, M.D. [facility] Fellow 10/02/2012 6:46 PM

## 2012-10-02 NOTE — MAU Provider Note (Signed)
Chart reviewed and agree with management and plan.  

## 2012-10-02 NOTE — MAU Note (Signed)
Patient is sent from ultrasound for further evaluation due to short cervix. Patient denies any discomfort, vaginal bleeding or lof. She reports good fetal movement.

## 2012-10-10 ENCOUNTER — Encounter: Payer: Self-pay | Admitting: Obstetrics & Gynecology

## 2012-10-17 ENCOUNTER — Encounter: Payer: Self-pay | Admitting: *Deleted

## 2012-10-24 ENCOUNTER — Encounter: Payer: Self-pay | Admitting: Obstetrics & Gynecology

## 2012-11-08 ENCOUNTER — Encounter: Payer: Self-pay | Admitting: *Deleted

## 2012-11-10 ENCOUNTER — Encounter (HOSPITAL_COMMUNITY): Payer: Self-pay | Admitting: *Deleted

## 2012-11-10 ENCOUNTER — Inpatient Hospital Stay (HOSPITAL_COMMUNITY)
Admission: AD | Admit: 2012-11-10 | Discharge: 2012-11-12 | DRG: 778 | Disposition: A | Discharge status: Home / Self Care | Payer: Medicaid Other | Source: Ambulatory Visit | Attending: Obstetrics & Gynecology | Admitting: Obstetrics & Gynecology

## 2012-11-10 DIAGNOSIS — O09293 Supervision of pregnancy with other poor reproductive or obstetric history, third trimester: Secondary | ICD-10-CM

## 2012-11-10 DIAGNOSIS — O4700 False labor before 37 completed weeks of gestation, unspecified trimester: Principal | ICD-10-CM | POA: Diagnosis present

## 2012-11-10 DIAGNOSIS — O09299 Supervision of pregnancy with other poor reproductive or obstetric history, unspecified trimester: Secondary | ICD-10-CM

## 2012-11-10 DIAGNOSIS — O09292 Supervision of pregnancy with other poor reproductive or obstetric history, second trimester: Secondary | ICD-10-CM

## 2012-11-10 HISTORY — DX: Anemia, unspecified: D64.9

## 2012-11-10 LAB — URINALYSIS, ROUTINE W REFLEX MICROSCOPIC
Bilirubin Urine: NEGATIVE
Ketones, ur: NEGATIVE mg/dL
Nitrite: NEGATIVE
Protein, ur: NEGATIVE mg/dL
Specific Gravity, Urine: 1.01 (ref 1.005–1.030)
Urobilinogen, UA: 0.2 mg/dL (ref 0.0–1.0)

## 2012-11-10 MED ORDER — ACETAMINOPHEN 325 MG PO TABS: 650.0000 mg | ORAL_TABLET | ORAL | Status: DC | PRN

## 2012-11-10 MED ORDER — PRENATAL MULTIVITAMIN CH
1.0000 | ORAL_TABLET | Freq: Every day | ORAL | Status: DC
  Administered 2012-11-11: 1 via ORAL
  Filled 2012-11-10: qty 1

## 2012-11-10 MED ORDER — LACTATED RINGERS IV BOLUS (SEPSIS)
500.0000 mL | Freq: Once | INTRAVENOUS | Status: AC
  Administered 2012-11-10: 500 mL via INTRAVENOUS

## 2012-11-10 MED ORDER — BETAMETHASONE SOD PHOS & ACET 6 (3-3) MG/ML IJ SUSP
12.0000 mg | INTRAMUSCULAR | Status: AC
  Administered 2012-11-11 (×2): 12 mg via INTRAMUSCULAR
  Filled 2012-11-10 (×2): qty 2

## 2012-11-10 MED ORDER — NIFEDIPINE 10 MG PO CAPS: 20.0000 mg | ORAL_CAPSULE | Freq: Once | ORAL | Status: DC

## 2012-11-10 MED ORDER — NIFEDIPINE 10 MG PO CAPS
20.0000 mg | ORAL_CAPSULE | ORAL | Status: DC
  Administered 2012-11-10: 20 mg via ORAL
  Filled 2012-11-10: qty 2

## 2012-11-10 MED ORDER — TERBUTALINE SULFATE 1 MG/ML IJ SOLN
0.2500 mg | Freq: Once | INTRAMUSCULAR | Status: AC
  Administered 2012-11-10: 0.25 mg via SUBCUTANEOUS
  Filled 2012-11-10: qty 1

## 2012-11-10 MED ORDER — MAGNESIUM SULFATE BOLUS VIA INFUSION
4.0000 g | Freq: Once | INTRAVENOUS | Status: AC
  Administered 2012-11-10: 4 g via INTRAVENOUS
  Filled 2012-11-10: qty 500

## 2012-11-10 MED ORDER — ZOLPIDEM TARTRATE 5 MG PO TABS: 5.0000 mg | ORAL_TABLET | Freq: Every evening | ORAL | Status: DC | PRN

## 2012-11-10 MED ORDER — CALCIUM CARBONATE ANTACID 500 MG PO CHEW: 2.0000 | CHEWABLE_TABLET | ORAL | Status: DC | PRN

## 2012-11-10 MED ORDER — DOCUSATE SODIUM 100 MG PO CAPS
100.0000 mg | ORAL_CAPSULE | Freq: Every day | ORAL | Status: DC
  Administered 2012-11-11: 100 mg via ORAL
  Filled 2012-11-10: qty 1

## 2012-11-10 MED ORDER — MAGNESIUM SULFATE 40 G IN LACTATED RINGERS - SIMPLE
2.0000 g/h | INTRAVENOUS | Status: DC
  Administered 2012-11-11: 2 g/h via INTRAVENOUS
  Filled 2012-11-10 (×2): qty 500

## 2012-11-10 MED ORDER — LACTATED RINGERS IV SOLN
INTRAVENOUS | Status: DC
  Administered 2012-11-11 (×2): via INTRAVENOUS

## 2012-11-10 NOTE — MAU Note (Signed)
Contractions since 1100. Hx PTL and 20wk stillbirth.

## 2012-11-10 NOTE — H&P (Signed)
[name] is a 22 y.o. female G2P0100 @[redacted]w[redacted]d  pt of [facility] presents to MAU for cramping/contractions all day.  She is seen in [facility] for hx of PPROM and demise at 19.3 weeks with previous pregnancy and has been treated during current pregnancy for preterm contractions with betamethasone completed on 09/26/12 and with daily Procardia 30 mg XL BID.  She missed her last clinic appointment and has not had her GTT during this pregnancy.  She had intercourse within last 24 hours so unable to perform FFN today.  She reports good fetal movement, denies LOF, vaginal bleeding, vaginal itching/burning, urinary symptoms, h/a, dizziness, n/v, or fever/chills.    Maternal Medical History:  Reason for admission: Contractions.  Nausea.  Contractions: Onset was 6-12 hours ago.   Frequency: regular.   Duration is approximately 1 minute.   Perceived severity is moderate.    Fetal activity: Perceived fetal activity is normal.   Last perceived fetal movement was within the past hour.    Prenatal complications: Preterm labor.   Prenatal Complications - Diabetes: Missed appointment, GTT not done.  OB History   Grav Para Term Preterm Abortions TAB SAB Ect Mult Living   2 1 0 1 0 0 0 0 0 0      Past Medical History  Diagnosis Date  . No pertinent past medical history   . UTI (lower urinary tract infection)   . Anemia    Past Surgical History  Procedure Laterality Date  . No past surgeries     Family History: family history is not on file. Social History:  reports that she has never smoked. She has never used smokeless tobacco. She reports that she does not drink alcohol or use illicit drugs.   Prenatal Transfer Tool  Maternal Diabetes: No Genetic Screening: Declined Maternal Ultrasounds/Referrals: Normal Fetal Ultrasounds or other Referrals:  None Maternal Substance Abuse:  No Significant Maternal Medications:  None Significant Maternal Lab Results:  Lab values include: Other:  Other Comments:  GBS  unknown--collected 9/5, Late to care--no genetic screen  Review of Systems  Constitutional: Negative for fever, chills and malaise/fatigue.  Eyes: Negative for blurred vision.  Respiratory: Negative for cough and shortness of breath.   Cardiovascular: Negative for chest pain.  Gastrointestinal: Positive for abdominal pain. Negative for heartburn, nausea and vomiting.  Genitourinary: Negative for dysuria, urgency and frequency.  Musculoskeletal: Negative.   Neurological: Negative for dizziness and headaches.  Psychiatric/Behavioral: Negative for depression.    Dilation: 3 Effacement (%): 80 Exam by:: Misty Stanley Leftwitch-Kirby CNM Blood pressure 111/69, pulse 105, temperature 98 F (36.7 C), resp. rate 18, height 5\' 3"  (1.6 m), weight 49.714 kg (109 lb 9.6 oz), last menstrual period 04/04/2012, SpO2 100.00%. Maternal Exam:  Uterine Assessment: Contraction strength is moderate.  Contraction duration is 70 seconds. Contraction frequency is regular.   Abdomen: Patient reports no abdominal tenderness. Fetal presentation: vertex  Cervix: Cervix evaluated by digital exam.     Fetal Exam Fetal Monitor Review: Mode: ultrasound.   Baseline rate: 135.  Variability: moderate (6-25 bpm).   Pattern: accelerations present and no decelerations.    Fetal State Assessment: Category I - tracings are normal.     Physical Exam  Nursing note and vitals reviewed. Constitutional: She is oriented to person, place, and time. She appears well-developed and well-nourished.  Neck: Normal range of motion.  Cardiovascular: Normal rate, regular rhythm and normal heart sounds.   Respiratory: Effort normal and breath sounds normal.  GI: Soft.  Musculoskeletal: Normal range of  motion.  Neurological: She is alert and oriented to person, place, and time.  Skin: Skin is warm and dry.  Psychiatric: She has a normal mood and affect. Her behavior is normal. Judgment and thought content normal.    Prenatal  labs: ABO, Rh: AB/POS/-- (07/21 1200) Antibody: NEG (07/21 1200) Rubella: 1.12 (07/21 1200) RPR: NON REAC (07/21 1200)  HBsAg: NEGATIVE (07/21 1200)  HIV: NON REACTIVE (07/21 1200)  GBS:   Unknown, Rapid test collected 11/10/12  Assessment/Plan: Preterm labor GBS Unknown  Admit to Antepartum Betamethasone x2 doses in 24 GBS by PCR collected UDS on admission Magnesium sulfate 4 g bolus/2g/hour (D/C Procardia) Continuous EFM and toco   LEFTWICH-KIRBY, LISA 11/10/2012, 10:09 PM

## 2012-11-10 NOTE — Plan of Care (Signed)
Problem: Consults Goal: Birthing Suites Patient Information Press F2 to bring up selections list   Pt < [redacted] weeks EGA     

## 2012-11-10 NOTE — Progress Notes (Signed)
Mostly ctxs are perceived as tightening but sometimes painful

## 2012-11-11 LAB — CBC
HCT: 30.7 % — ABNORMAL LOW (ref 36.0–46.0)
MCH: 26.9 pg (ref 26.0–34.0)
MCHC: 33.2 g/dL (ref 30.0–36.0)
MCV: 81 fL (ref 78.0–100.0)
RDW: 13.1 % (ref 11.5–15.5)

## 2012-11-11 LAB — RAPID URINE DRUG SCREEN, HOSP PERFORMED
Barbiturates: NOT DETECTED
Tetrahydrocannabinol: NOT DETECTED

## 2012-11-11 NOTE — Progress Notes (Signed)
FACULTY PRACTICE ANTEPARTUM COMPREHENSIVE PROGRESS NOTE  [name] is a 22 y.o. G2P0100 at [redacted]w[redacted]d  who is admitted for preterm labor.  Estimated Date of Delivery: 01/09/13.  Fetal presentation is cephalic.  Length of Stay:  1 Days. Admitted on 11/10/2012.   Subjective: Patient reports rare uterine contractions, no bleeding and no loss of fluid per vagina. Reports good fetal movement.    Vitals:  Blood pressure 112/65, pulse 87, temperature 98.2 F (36.8 C), temperature source Oral, resp. rate 18, height 5\' 3"  (1.6 m), weight 109 lb (49.442 kg), last menstrual period 04/04/2012, SpO2 100.00%. Physical Examination: General appearance - alert, well appearing, and in no distress Abdomen - soft, nontender, nondistended, no masses or organomegaly Extremities - no edema, redness or tenderness in the calves or thighs, Homan's sign negative bilaterally Cervical Exam: Not evaluated.  Membranes:intact  Fetal Monitoring:  Baseline: 135 bpm, Variability: moderate, Accelerations: Reactive and Decelerations: Absent  Tocometer: 4-6 contractions per hours  Results for orders placed during the hospital encounter of 11/10/12 (from the past 24 hour(s))  URINALYSIS, ROUTINE W REFLEX MICROSCOPIC   Collection Time    11/10/12  7:46 PM      Result Value Range   Color, Urine YELLOW  YELLOW   APPearance CLEAR  CLEAR   Specific Gravity, Urine 1.010  1.005 - 1.030   pH 5.5  5.0 - 8.0   Glucose, UA 250 (*) NEGATIVE mg/dL   Hgb urine dipstick NEGATIVE  NEGATIVE   Bilirubin Urine NEGATIVE  NEGATIVE   Ketones, ur NEGATIVE  NEGATIVE mg/dL   Protein, ur NEGATIVE  NEGATIVE mg/dL   Urobilinogen, UA 0.2  0.0 - 1.0 mg/dL   Nitrite NEGATIVE  NEGATIVE   Leukocytes, UA MODERATE (*) NEGATIVE  URINE MICROSCOPIC-ADD ON   Collection Time    11/10/12  7:46 PM      Result Value Range   Squamous Epithelial / LPF FEW (*) RARE   WBC, UA 7-10  <3 WBC/hpf   RBC / HPF 0-2  <3 RBC/hpf   Bacteria, UA FEW (*) RARE  GROUP B  STREP BY PCR   Collection Time    11/10/12 11:25 PM      Result Value Range   Group B strep by PCR NEGATIVE  NEGATIVE  CBC   Collection Time    11/10/12 11:54 PM      Result Value Range   WBC 11.6 (*) 4.0 - 10.5 K/uL   RBC 3.79 (*) 3.87 - 5.11 MIL/uL   Hemoglobin 10.2 (*) 12.0 - 15.0 g/dL   HCT 14.7 (*) 82.9 - 56.2 %   MCV 81.0  78.0 - 100.0 fL   MCH 26.9  26.0 - 34.0 pg   MCHC 33.2  30.0 - 36.0 g/dL   RDW 13.0  86.5 - 78.4 %   Platelets 257  150 - 400 K/uL  RPR   Collection Time    11/10/12 11:54 PM      Result Value Range   RPR NON REACTIVE  NON REACTIVE  URINE RAPID DRUG SCREEN (HOSP PERFORMED)   Collection Time    11/11/12 12:50 AM      Result Value Range   Opiates NONE DETECTED  NONE DETECTED   Cocaine NONE DETECTED  NONE DETECTED   Benzodiazepines NONE DETECTED  NONE DETECTED   Amphetamines NONE DETECTED  NONE DETECTED   Tetrahydrocannabinol NONE DETECTED  NONE DETECTED   Barbiturates NONE DETECTED  NONE DETECTED    No results found.  Marland Kitchen  betamethasone acetate-betamethasone sodium phosphate  12 mg Intramuscular Q24H  . docusate sodium  100 mg Oral Daily  . prenatal multivitamin  1 tablet Oral Q1200   I have reviewed the patient's current medications.  ASSESSMENT: Patient Active Problem List   Diagnosis Date Noted  . Preterm labor 11/11/2012  . History of preterm premature rupture of membranes (PROM) in previous pregnancy, currently pregnant in second trimester 09/25/2012  . Supervision of high risk pregnancy in second trimester 09/25/2012    PLAN: Continue magnesium sulfate until after she receives 2nd dose of betamethasone at midnight.  Continue routine antenatal care.   Jaynie Collins, MD, FACOG Attending Obstetrician & Gynecologist Faculty Practice, [facility]

## 2012-11-11 NOTE — H&P (Signed)
Attestation of Attending Supervision of Advanced Practitioner (PA/CNM/NP): Evaluation and management procedures were performed by the Advanced Practitioner under my supervision and collaboration.  I have reviewed the Advanced Practitioner's note and chart, and I agree with the management and plan.  Malasia Torain, MD, FACOG Attending Obstetrician & Gynecologist Faculty Practice, [facility]  

## 2012-11-11 NOTE — Progress Notes (Signed)
Neo consult completed

## 2012-11-12 MED ORDER — NIFEDIPINE ER 60 MG PO TB24: 60.0000 mg | ORAL_TABLET | Freq: Two times a day (BID) | ORAL | Status: DC

## 2012-11-12 MED ORDER — NIFEDIPINE ER 60 MG PO TB24
60.0000 mg | ORAL_TABLET | Freq: Two times a day (BID) | ORAL | Status: DC
  Administered 2012-11-12: 60 mg via ORAL
  Filled 2012-11-12: qty 1

## 2012-11-12 NOTE — Discharge Summary (Signed)
Antenatal Physician Discharge Summary  Patient ID: [name] MRN: [id number] DOB/AGE: 1991-03-05 22 y.o.  Admit date: 11/10/2012 Discharge date: 11/12/2012  Admission Diagnoses: Threatened PTL   Discharge Diagnoses: Same  Prenatal Procedures: NST  Significant Diagnostic Studies:  Results for orders placed during the hospital encounter of 11/10/12 (from the past 168 hour(s))  URINALYSIS, ROUTINE W REFLEX MICROSCOPIC   Collection Time    11/10/12  7:46 PM      Result Value Range   Color, Urine YELLOW  YELLOW   APPearance CLEAR  CLEAR   Specific Gravity, Urine 1.010  1.005 - 1.030   pH 5.5  5.0 - 8.0   Glucose, UA 250 (*) NEGATIVE mg/dL   Hgb urine dipstick NEGATIVE  NEGATIVE   Bilirubin Urine NEGATIVE  NEGATIVE   Ketones, ur NEGATIVE  NEGATIVE mg/dL   Protein, ur NEGATIVE  NEGATIVE mg/dL   Urobilinogen, UA 0.2  0.0 - 1.0 mg/dL   Nitrite NEGATIVE  NEGATIVE   Leukocytes, UA MODERATE (*) NEGATIVE  URINE MICROSCOPIC-ADD ON   Collection Time    11/10/12  7:46 PM      Result Value Range   Squamous Epithelial / LPF FEW (*) RARE   WBC, UA 7-10  <3 WBC/hpf   RBC / HPF 0-2  <3 RBC/hpf   Bacteria, UA FEW (*) RARE  GROUP B STREP BY PCR   Collection Time    11/10/12 11:25 PM      Result Value Range   Group B strep by PCR NEGATIVE  NEGATIVE  CBC   Collection Time    11/10/12 11:54 PM      Result Value Range   WBC 11.6 (*) 4.0 - 10.5 K/uL   RBC 3.79 (*) 3.87 - 5.11 MIL/uL   Hemoglobin 10.2 (*) 12.0 - 15.0 g/dL   HCT 40.9 (*) 81.1 - 91.4 %   MCV 81.0  78.0 - 100.0 fL   MCH 26.9  26.0 - 34.0 pg   MCHC 33.2  30.0 - 36.0 g/dL   RDW 78.2  95.6 - 21.3 %   Platelets 257  150 - 400 K/uL  RPR   Collection Time    11/10/12 11:54 PM      Result Value Range   RPR NON REACTIVE  NON REACTIVE  URINE RAPID DRUG SCREEN (HOSP PERFORMED)   Collection Time    11/11/12 12:50 AM      Result Value Range   Opiates NONE DETECTED  NONE DETECTED   Cocaine NONE DETECTED  NONE DETECTED   Benzodiazepines NONE DETECTED  NONE DETECTED   Amphetamines NONE DETECTED  NONE DETECTED   Tetrahydrocannabinol NONE DETECTED  NONE DETECTED   Barbiturates NONE DETECTED  NONE DETECTED    Treatments: IV hydration, steroids: Betamethasone x 2 and tocolysis  Hospital Course:  This is a 22 y.o. G2P0100 with IUP at [redacted]w[redacted]d admitted for threatened PTL. She was admitted with contractions, noted to have a cervical exam of 3 cm/80.  No leaking of fluid and no bleeding.  She was initially started on magnesium sulfate for tocolysis and neuroprotection and also received betamethasone x 2 doses.  Her tocolysis was transitioned to Procardia. She was observed, fetal heart rate monitoring remained reassuring, and she had no signs/symptoms of progressing preterm labor or other maternal-fetal concerns.  She stopped contracting.  Her activity level was increased.  She was deemed stable for discharge to home with outpatient follow up.   Discharge Condition: Stable  Disposition: 01-Home or Self Care  Future Appointments Provider Department Dept Phone   11/14/2012 3:30 PM Vale Haven, MD [facility] [phone]       Medication List    ASK your doctor about these medications       Abdominal Binder/Elastic Small Misc  1 Units by Does not apply route daily as needed.     Ferrous Sulfate 143 (45 FE) MG Tbcr  Commonly known as:  [employer] SLOW RELEASE IRON  Take 1 tablet by mouth daily.     NIFEdipine 30 MG 24 hr tablet  Commonly known as:  PROCARDIA XL  Take 1 tablet (30 mg total) by mouth 2 (two) times daily.     prenatal multivitamin Tabs tablet  Take 1 tablet by mouth daily at 12 noon.     progesterone 200 MG capsule  Commonly known as:  PROMETRIUM  Insert 1 capsule per vagina daily at bedtime      Procardia XL 60 mg bid     Follow-up Information   Follow up with [facility] In 1 week. (Keep next scheduled appointment)    Specialty:  Obstetrics and Gynecology   Contact  information:   [address] [place] [address] [phone]      Signed: Reva Bores M.D. 11/12/2012, 7:24 AM

## 2012-11-12 NOTE — Progress Notes (Signed)
Patient ID: [name], female   DOB: 1990/10/28, 22 y.o.   MRN: [id number] FACULTY PRACTICE ANTEPARTUM(COMPREHENSIVE) NOTE  [name] is a 22 y.o. G2P0100 at [redacted]w[redacted]d by best clinical estimate who is admitted for Preterm labor.   Fetal presentation is unsure. Length of Stay:  2  Days  Subjective: No contractions on Magnesium.  Now s/p 2nd round of BMZ. Patient reports the fetal movement as active. Patient reports uterine contraction  activity as none. Patient reports  vaginal bleeding as none. Patient describes fluid per vagina as None.  Vitals:  Blood pressure 125/54, pulse 108, temperature 98.6 F (37 C), temperature source Oral, resp. rate 18, height 5\' 3"  (1.6 m), weight 109 lb (49.442 kg), last menstrual period 04/04/2012, SpO2 100.00%. Physical Examination:  General appearance - alert, well appearing, and in no distress Abdomen - soft, nontender, nondistended, no masses or organomegaly gravid Fundal Height:  size equals dates Extremities: extremities normal, atraumatic, no cyanosis or edema  Membranes:intact  Fetal Monitoring:  Baseline: 110 bpm, Variability: Good {> 6 bpm), Accelerations: Reactive and Decelerations: Absent  Medications:  Scheduled . docusate sodium  100 mg Oral Daily  . prenatal multivitamin  1 tablet Oral Q1200   I have reviewed the patient's current medications.  ASSESSMENT: Patient Active Problem List   Diagnosis Date Noted  . Preterm labor 11/11/2012  . History of preterm premature rupture of membranes (PROM) in previous pregnancy, currently pregnant in second trimester 09/25/2012  . Supervision of high risk pregnancy in second trimester 09/25/2012    PLAN: Discontinue Magnesium. Procardia 60 mg XL If does well--d/c later today  Eldean Klatt S 11/12/2012,7:19 AM

## 2012-11-13 LAB — URINE CULTURE: Colony Count: 30000

## 2012-11-14 ENCOUNTER — Ambulatory Visit (INDEPENDENT_AMBULATORY_CARE_PROVIDER_SITE_OTHER): Payer: Medicaid Other | Admitting: Family Medicine

## 2012-11-14 VITALS — BP 113/64 | Temp 98.0°F | Wt 113.8 lb

## 2012-11-14 DIAGNOSIS — O09299 Supervision of pregnancy with other poor reproductive or obstetric history, unspecified trimester: Secondary | ICD-10-CM

## 2012-11-14 DIAGNOSIS — O0992 Supervision of high risk pregnancy, unspecified, second trimester: Secondary | ICD-10-CM

## 2012-11-14 LAB — POCT URINALYSIS DIP (DEVICE)
Hgb urine dipstick: NEGATIVE
Nitrite: NEGATIVE
Protein, ur: NEGATIVE mg/dL
Urobilinogen, UA: 0.2 mg/dL (ref 0.0–1.0)
pH: 7 (ref 5.0–8.0)

## 2012-11-14 NOTE — Patient Instructions (Addendum)

## 2012-11-14 NOTE — Progress Notes (Signed)
S: pt is a 22 yo G2P0100 at [redacted]w[redacted]d with a hx shortened cervix and PTL recently discharged on 9/5.  Has been taking her procardia and prometrium. No longer having regular contractions.  Has approx 2-4 nonpainful contractions daily but no more. No LOF, VB. +FM.   O: see flowsheet. Unable to give urine sample  A/P 22 yo with hx of shortened cervix and threatened PTL here for routine visit - doing well. No contractions concerning for PTL - cont procardia and prometrium - s/p BMZ - FM discussed and PTL precautions discussed.  - f/u in 2 weeks.

## 2012-11-14 NOTE — Progress Notes (Signed)
Pulse- 99 Pain/pressure-lower abd

## 2012-11-24 ENCOUNTER — Inpatient Hospital Stay (HOSPITAL_COMMUNITY)
Admission: AD | Admit: 2012-11-24 | Discharge: 2012-11-26 | DRG: 774 | Disposition: A | Discharge status: Home / Self Care | Payer: Medicaid Other | Source: Ambulatory Visit | Attending: Obstetrics & Gynecology | Admitting: Obstetrics & Gynecology

## 2012-11-24 ENCOUNTER — Encounter (HOSPITAL_COMMUNITY): Payer: Self-pay | Admitting: *Deleted

## 2012-11-24 DIAGNOSIS — O0930 Supervision of pregnancy with insufficient antenatal care, unspecified trimester: Secondary | ICD-10-CM

## 2012-11-24 DIAGNOSIS — O09292 Supervision of pregnancy with other poor reproductive or obstetric history, second trimester: Secondary | ICD-10-CM

## 2012-11-24 DIAGNOSIS — O4590 Premature separation of placenta, unspecified, unspecified trimester: Secondary | ICD-10-CM

## 2012-11-24 LAB — URINE MICROSCOPIC-ADD ON

## 2012-11-24 LAB — URINALYSIS, ROUTINE W REFLEX MICROSCOPIC
Bilirubin Urine: NEGATIVE
Glucose, UA: NEGATIVE mg/dL
Hgb urine dipstick: NEGATIVE
Protein, ur: NEGATIVE mg/dL
Urobilinogen, UA: 0.2 mg/dL (ref 0.0–1.0)

## 2012-11-24 LAB — RAPID URINE DRUG SCREEN, HOSP PERFORMED
Amphetamines: NOT DETECTED
Opiates: NOT DETECTED
Tetrahydrocannabinol: NOT DETECTED

## 2012-11-24 LAB — RPR: RPR Ser Ql: NONREACTIVE

## 2012-11-24 LAB — CBC
HCT: 32.2 % — ABNORMAL LOW (ref 36.0–46.0)
Hemoglobin: 10.5 g/dL — ABNORMAL LOW (ref 12.0–15.0)
MCH: 26.4 pg (ref 26.0–34.0)
MCHC: 32.6 g/dL (ref 30.0–36.0)
RDW: 13.5 % (ref 11.5–15.5)

## 2012-11-24 MED ORDER — TETANUS-DIPHTH-ACELL PERTUSSIS 5-2.5-18.5 LF-MCG/0.5 IM SUSP
0.5000 mL | Freq: Once | INTRAMUSCULAR | Status: AC
  Administered 2012-11-25: 0.5 mL via INTRAMUSCULAR
  Filled 2012-11-24: qty 0.5

## 2012-11-24 MED ORDER — SIMETHICONE 80 MG PO CHEW: 80.0000 mg | CHEWABLE_TABLET | ORAL | Status: DC | PRN

## 2012-11-24 MED ORDER — OXYTOCIN BOLUS FROM INFUSION: 500.0000 mL | INTRAVENOUS | Status: DC

## 2012-11-24 MED ORDER — PRENATAL MULTIVITAMIN CH
1.0000 | ORAL_TABLET | Freq: Every day | ORAL | Status: DC
  Administered 2012-11-25 – 2012-11-26 (×2): 1 via ORAL
  Filled 2012-11-24 (×2): qty 1

## 2012-11-24 MED ORDER — LIDOCAINE HCL (PF) 1 % IJ SOLN
30.0000 mL | INTRAMUSCULAR | Status: DC | PRN
  Filled 2012-11-24: qty 30

## 2012-11-24 MED ORDER — MISOPROSTOL 200 MCG PO TABS
ORAL_TABLET | ORAL | Status: AC
  Filled 2012-11-24: qty 5

## 2012-11-24 MED ORDER — LACTATED RINGERS IV SOLN: 500.0000 mL | INTRAVENOUS | Status: DC | PRN

## 2012-11-24 MED ORDER — DIPHENHYDRAMINE HCL 25 MG PO CAPS: 25.0000 mg | ORAL_CAPSULE | Freq: Four times a day (QID) | ORAL | Status: DC | PRN

## 2012-11-24 MED ORDER — SODIUM CHLORIDE 0.9 % IV SOLN
2.0000 g | Freq: Four times a day (QID) | INTRAVENOUS | Status: DC
  Filled 2012-11-24 (×2): qty 2000

## 2012-11-24 MED ORDER — WITCH HAZEL-GLYCERIN EX PADS: 1.0000 "application " | MEDICATED_PAD | CUTANEOUS | Status: DC | PRN

## 2012-11-24 MED ORDER — BENZOCAINE-MENTHOL 20-0.5 % EX AERO
1.0000 "application " | INHALATION_SPRAY | CUTANEOUS | Status: DC | PRN
  Administered 2012-11-24: 1 via TOPICAL
  Filled 2012-11-24: qty 56

## 2012-11-24 MED ORDER — INFLUENZA VAC SPLIT QUAD 0.5 ML IM SUSP
0.5000 mL | INTRAMUSCULAR | Status: AC
  Administered 2012-11-25: 0.5 mL via INTRAMUSCULAR
  Filled 2012-11-24: qty 0.5

## 2012-11-24 MED ORDER — ZOLPIDEM TARTRATE 5 MG PO TABS: 5.0000 mg | ORAL_TABLET | Freq: Every evening | ORAL | Status: DC | PRN

## 2012-11-24 MED ORDER — SENNOSIDES-DOCUSATE SODIUM 8.6-50 MG PO TABS
2.0000 | ORAL_TABLET | ORAL | Status: DC
  Administered 2012-11-24 – 2012-11-26 (×2): 2 via ORAL

## 2012-11-24 MED ORDER — FLEET ENEMA 7-19 GM/118ML RE ENEM: 1.0000 | ENEMA | RECTAL | Status: DC | PRN

## 2012-11-24 MED ORDER — ACETAMINOPHEN 325 MG PO TABS: 650.0000 mg | ORAL_TABLET | ORAL | Status: DC | PRN

## 2012-11-24 MED ORDER — ONDANSETRON HCL 4 MG/2ML IJ SOLN: 4.0000 mg | Freq: Four times a day (QID) | INTRAMUSCULAR | Status: DC | PRN

## 2012-11-24 MED ORDER — LACTATED RINGERS IV SOLN
INTRAVENOUS | Status: DC
  Administered 2012-11-24: 15:00:00 via INTRAVENOUS

## 2012-11-24 MED ORDER — OXYCODONE-ACETAMINOPHEN 5-325 MG PO TABS: 1.0000 | ORAL_TABLET | ORAL | Status: DC | PRN

## 2012-11-24 MED ORDER — IBUPROFEN 600 MG PO TABS
600.0000 mg | ORAL_TABLET | Freq: Four times a day (QID) | ORAL | Status: DC
  Administered 2012-11-24 – 2012-11-26 (×8): 600 mg via ORAL
  Filled 2012-11-24 (×8): qty 1

## 2012-11-24 MED ORDER — ONDANSETRON HCL 4 MG PO TABS: 4.0000 mg | ORAL_TABLET | ORAL | Status: DC | PRN

## 2012-11-24 MED ORDER — CITRIC ACID-SODIUM CITRATE 334-500 MG/5ML PO SOLN: 30.0000 mL | ORAL | Status: DC | PRN

## 2012-11-24 MED ORDER — ONDANSETRON HCL 4 MG/2ML IJ SOLN: 4.0000 mg | INTRAMUSCULAR | Status: DC | PRN

## 2012-11-24 MED ORDER — DIBUCAINE 1 % RE OINT: 1.0000 "application " | TOPICAL_OINTMENT | RECTAL | Status: DC | PRN

## 2012-11-24 MED ORDER — OXYTOCIN 40 UNITS IN LACTATED RINGERS INFUSION - SIMPLE MED
62.5000 mL/h | INTRAVENOUS | Status: DC
  Administered 2012-11-24: 62.5 mL/h via INTRAVENOUS
  Filled 2012-11-24: qty 1000

## 2012-11-24 MED ORDER — LANOLIN HYDROUS EX OINT: TOPICAL_OINTMENT | CUTANEOUS | Status: DC | PRN

## 2012-11-24 MED ORDER — IBUPROFEN 600 MG PO TABS: 600.0000 mg | ORAL_TABLET | Freq: Four times a day (QID) | ORAL | Status: DC | PRN

## 2012-11-24 NOTE — Consult Note (Signed)
Neonatology Note:  Attendance at Delivery:  I was asked by Dr. Arnold to attend this NSVD at 33 3/7 weeks after onset of preterm labor. The mother is a G2P0A1 AB pos, GBS neg with late PNC, history of preterm labor, on Procardia. She received Betamethasone on 7/21-22 and 9/5-6. UDS negative. ROM just before delivery, fluid clear. There were some FHR decelerations during pushing and some maternal blood came out behind the baby, suggestive of a partial abruption. Infant was cyanotic but with some spontaneous cry and slightly decreased tone. Needed bulb suctioning and stimulation. O2 saturations in room air were about 54% at 2-2.5 min, so the neopuff was applied and the saturations came up to normal quickly, with improved air exchange. Ap 7/9. Lungs clear to ausc in DR. Shown briefly to mother, then transported in room air with O2 saturations in the 90s, to the NICU for further care.  Vollie Brunty C. Agape Hardiman, MD  

## 2012-11-24 NOTE — H&P (Signed)
[name] is a 22 y.o. female presenting for preterm labor at 9cm Pt has hx of PPROM with fetal loss. Pt currently has been on prometreum and rec'd steroids this pregnacny x2. +FM, +Ctx, +vb No lof . History OB History   Grav Para Term Preterm Abortions TAB SAB Ect Mult Living   2 1 0 1 0 0 0 0 0 0      Past Medical History  Diagnosis Date  . No pertinent past medical history   . UTI (lower urinary tract infection)   . Anemia   . Preterm delivery    Past Surgical History  Procedure Laterality Date  . No past surgeries     Family History: family history is not on file. Social History:  reports that she has never smoked. She has never used smokeless tobacco. She reports that she does not drink alcohol or use illicit drugs.   Prenatal Transfer Tool  Maternal Diabetes: No Genetic Screening: Normal Maternal Ultrasounds/Referrals: Normal Fetal Ultrasounds or other Referrals:  None Maternal Substance Abuse:  No Significant Maternal Medications:  Meds include: Other: preogesterone Significant Maternal Lab Results:  Lab values include: Group B Strep negative Other Comments:  None  ROS In usual state of health  Dilation: Lip/rim (bulging membranes) Effacement (%): 100 Exam by:: L. Paschal, Rn Blood pressure 117/62, pulse 109, temperature 98.2 F (36.8 C), temperature source Oral, height 5\' 2"  (1.575 m), weight 49.442 kg (109 lb), last menstrual period 04/04/2012. Exam Physical Exam   Prenatal labs: ABO, Rh: AB/POS/-- (07/21 1200) Antibody: NEG (07/21 1200) Rubella: 1.12 (07/21 1200) RPR: NON REACTIVE (09/05 2354)  HBsAg: NEGATIVE (07/21 1200)  HIV: NON REACTIVE (07/21 1200)  GBS:     Assessment/Plan: Belkys Henault is a 22 y.o. G2P0100 at [redacted]w[redacted]d #Labor: PTL expectant management #Pain: unavailble due to progression of labor  #FWB: Cat II #ID: GBS Neg - Does not require ABx MOF: Breast  Stasia Somero RYAN 11/24/2012, 3:07 PM

## 2012-11-24 NOTE — Lactation Note (Signed)
This note was copied from the chart of [name]. Lactation Consultation Note Initial consult with this mom of a NICU baby, now 3 hours [post [partum. I started mom pumping premie setting with a DEP, and showed mom how to hand express. I was able to express 11 mls of colostrum from mom.  Basic teaching on pumping frequency and duration done with mom, and I briefly reviewed the NICU book on providing breast milk for a NICU baby with mom. Mom has Eddie Candle knows about the DEP loaner program. I will follow this family in the NICU  Patient Name: [name] Today's Date: 11/24/2012 Reason for consult: Initial assessment;NICU baby   Maternal Data Formula Feeding for Exclusion: Yes (baby in the NICU) Reason for exclusion: Mother's choice to formula and breast feed on admission Infant to breast within first hour of birth: No Breastfeeding delayed due to:: Infant status Has patient been taught Hand Expression?: Yes Does the patient have breastfeeding experience prior to this delivery?: No  Feeding    LATCH Score/Interventions                      Lactation Tools Discussed/Used Tools: Pump Breast pump type: Double-Electric Breast Pump WIC Program: Yes (mom knows to call for DEP - may need loaner on sunday) Initiated by:: c Marguita Venning RN within 3 hours of delivery Date initiated:: 11/24/12 (1900)   Consult Status Consult Status: Follow-up Date: 11/25/12 Follow-up type: In-patient    Alfred Levins 11/24/2012, 7:39 PM

## 2012-11-24 NOTE — MAU Note (Addendum)
Having contractions, started at 1000 this morning, getting stronger. No bleeding or leaking. Hx of PTL, on medications. Is 3 cm

## 2012-11-24 NOTE — MAU Note (Signed)
Dr. Maggie Font notified by phone of pt arrival, preterm, U/C's pattern.  Md made aware to come to MAU to evaluate pt.  Md wishes RN to check VE at this time.

## 2012-11-25 NOTE — Lactation Note (Signed)
This note was copied from the chart of Mackenzie Magdalen Spatz. Lactation Consultation Note: Follow up visit with mom. She is getting ready to pump at this time. She reports that she pumped 3 times yesterday and this is her first time to pump today. Encouraged to pump q 3 hours- at least 8 times/ 24 hours. Reports that she got a tube and half of EBM yesterday. Encouraged hand expression after pumping. No questions at present. Plans for [facility] loaner tomorrow- has paperwork.   Patient Name: [name] [id number] Date: 11/25/2012 Reason for consult: Follow-up assessment   Maternal Data    Feeding    LATCH Score/Interventions                      Lactation Tools Discussed/Used     Consult Status Consult Status: Follow-up Date: 11/26/12 Follow-up type: In-patient    Pamelia Hoit 11/25/2012, 11:19 AM

## 2012-11-25 NOTE — Progress Notes (Signed)
Post Partum Day 1 Subjective: no complaints, up ad lib and Breast-feeding with good pumping, good results from lactation consult assistant  Objective: Blood pressure 91/57, pulse 64, temperature 97.8 F (36.6 C), temperature source Oral, resp. rate 16, height 5\' 2"  (1.575 m), weight 49.442 kg (109 lb), last menstrual period 04/04/2012, SpO2 100.00%.  Physical Exam:  General: alert, cooperative and no distress Lochia: appropriate Uterine Fundus: firm at U -1 Incision:  DVT Evaluation: No evidence of DVT seen on physical exam.   Recent Labs  11/24/12 1505  HGB 10.5*  HCT 32.2*    Assessment/Plan: Plan for discharge tomorrow, Breastfeeding and Contraception Depo-Provera   LOS: 1 day   Tyanne Derocher V 11/25/2012, 9:56 AM

## 2012-11-26 MED ORDER — IBUPROFEN 600 MG PO TABS: 600.0000 mg | ORAL_TABLET | Freq: Four times a day (QID) | ORAL | Status: DC

## 2012-11-26 NOTE — Progress Notes (Signed)
Clinical Social Work Department PSYCHOSOCIAL ASSESSMENT - MATERNAL/CHILD 11/26/2012  Patient:  [name], [name]  Account Number:  [id number]  Admit Date:  11/24/2012  Marjo Bicker Name:   Adalberto Ill    Clinical Social Worker:  Felicia Both, LCSW   Date/Time:  11/26/2012 11:00 AM  Date Referred:  11/24/2012   Referral source  NICU     Referred reason  NICU   Other referral source:    I:  FAMILY / HOME ENVIRONMENT Child's legal guardian:  PARENT  Guardian - Name Guardian - Age Guardian - Address  [name],[name] 22   Gales, Nataione 19    Other household support members/support persons Other support:   Maternal grandmother    II  PSYCHOSOCIAL DATA Information Source:  Patient Interview  Event organiser Employment:   Surveyor, quantity resources:  [employer]:   Clinical biochemist  WIC   School / Grade:   Maternity Care Coordinator / Child Services Coordination / Early Interventions:  Cultural issues impacting care:    III  STRENGTHS Strengths  Adequate Resources  Home prepared for Child (including basic supplies)  Supportive family/friends  Understanding of illness   Strength comment:    IV  RISK FACTORS AND CURRENT PROBLEMS Current Problem:       V  SOCIAL WORK ASSESSMENT Met with mother who was pleasant and receptive to social work intervention.  She is a single parent with no other dependents.  Informed by RN that father is questioning paternity, however mother did not mention this.   Mother resides with maternal grandmother.   Both parents are unemployed primary support is maternal grandmother.  Mother states that she and father are no longer in a relationship and she is unsure of his support.  She denies any hx of mental illness or substance abuse.    Mother seems to be coping well with newborn NICU admission.  Informed that that she has spoken with the medical team and was told that the baby needs to gain weight and learn  how to eat.   Informed that friends and family will provide transportation for her to visit with newborn.   No acute social concerns related at this time.  CSW will follow PRN.   VI SOCIAL WORK PLAN  Type of pt/family education:   If child protective services report - county:   If child protective services report - date:   Information/referral to community resources comment:   Other social work plan:   CSW will continue to follow    Christyann Manolis J, LCSW

## 2012-11-26 NOTE — Progress Notes (Signed)
Discharge instructions reviewed with patient.  Patient states understanding of home care, medications, activity, signs/symptoms to report to MD and return MD office visit.  No home equipment needed.  Patient ambulated for discharge in stable condition without incident.

## 2012-11-26 NOTE — Discharge Summary (Signed)
Obstetric Discharge Summary Reason for Admission: onset of labor and preterm labor Prenatal Procedures: none Intrapartum Procedures: spontaneous vaginal delivery Postpartum Procedures: none Complications-Operative and Postpartum: none Hemoglobin  Date Value Range Status  11/24/2012 10.5* 12.0 - 15.0 g/dL Final     HCT  Date Value Range Status  11/24/2012 32.2* 36.0 - 46.0 % Final    Physical Exam:  General: alert, cooperative and no distress Lochia: appropriate Uterine Fundus: firm DVT Evaluation: No evidence of DVT seen on physical exam.  Discharge Diagnoses: preterm birth  Discharge Information: Date: 11/26/2012 Activity: pelvic rest Diet: routine Medications: Ibuprofen Condition: improved Instructions:  preterm birth Discharge to: home   Newborn Data: Live born female  Birth Weight: 3 lb 15.9 oz (1810 g) APGAR: 7, 9  NICU  ARNOLD,JAMES 11/26/2012, 7:11 AM

## 2012-11-29 ENCOUNTER — Encounter: Payer: Self-pay | Admitting: Obstetrics and Gynecology

## 2012-11-29 ENCOUNTER — Encounter: Payer: Self-pay | Admitting: Advanced Practice Midwife

## 2012-12-12 ENCOUNTER — Encounter (HOSPITAL_COMMUNITY): Payer: Self-pay | Admitting: *Deleted

## 2013-01-03 ENCOUNTER — Ambulatory Visit: Payer: Self-pay | Admitting: Obstetrics and Gynecology

## 2013-03-08 NOTE — L&D Delivery Note (Signed)
Delivery Note At 6:05 AM a viable and healthy female was delivered via Vaginal, Spontaneous Delivery (Presentation: Left Occiput Anterior).  APGAR: 8, 9; weight pending .   Placenta status: Intact, Spontaneous Pathology.  Cord: 3 vessels with the following complications: Marginal cord insertion.    Anesthesia: None  Episiotomy: None Lacerations: Periurethral, hemostatic, not repaired Suture Repair: n/a Est. Blood Loss (mL): 300  Mom to postpartum.  Baby to NICU.   [id number] [facility]x of 33 week delivery and 19 week PPROM previable delivery.  She was admitted during this pregnancy on 09/04/13 and had BMZ x 2 during her admission.  She [facility], had SROM and very quickly progressed to 10 cm. Pt was unable to obtain desired epidural and pushed x1 contractions to deliver viable female infant.  Infant vigorous and placed in mother's arms with cord clamping delayed by 2-3 minutes.  Cord clamped by CNM and cut by FOB.  Infant taken to warmer were NICU team waiting to evaluated.  Infant taken to NICU r/t gestational age in stable condition.  Placenta delivered spontaneously and was intact but had very marginal, almost velamentous cord insertion.  Small periurethral tear noted which was hemostatic after some slight pressure applied so not repaired.  Pt stable, infant transported to NICU.   LEFTWICH-KIRBY, Arnetia Bronk 09/11/2013, 6:26 AM

## 2013-03-08 NOTE — L&D Delivery Note (Signed)
Attestation of Attending Supervision of Advanced Practitioner (CNM/NP): Evaluation and management procedures were performed by the Advanced Practitioner under my supervision and collaboration.  I have reviewed the Advanced Practitioner's note and chart, and I agree with the management and plan.  HARRAWAY-SMITH, Geraldina Parrott 7:16 AM

## 2013-05-22 ENCOUNTER — Ambulatory Visit: Payer: Medicaid Other

## 2013-06-12 ENCOUNTER — Ambulatory Visit: Payer: Medicaid Other

## 2013-08-10 ENCOUNTER — Inpatient Hospital Stay (HOSPITAL_COMMUNITY): Payer: Medicaid Other

## 2013-08-10 ENCOUNTER — Encounter (HOSPITAL_COMMUNITY): Payer: Self-pay | Admitting: *Deleted

## 2013-08-10 ENCOUNTER — Inpatient Hospital Stay (HOSPITAL_COMMUNITY)
Admission: AD | Admit: 2013-08-10 | Discharge: 2013-08-10 | Disposition: A | Discharge status: Home / Self Care | Payer: Medicaid Other | Source: Ambulatory Visit | Attending: Obstetrics and Gynecology | Admitting: Obstetrics and Gynecology

## 2013-08-10 DIAGNOSIS — B9689 Other specified bacterial agents as the cause of diseases classified elsewhere: Secondary | ICD-10-CM | POA: Insufficient documentation

## 2013-08-10 DIAGNOSIS — A499 Bacterial infection, unspecified: Secondary | ICD-10-CM | POA: Insufficient documentation

## 2013-08-10 DIAGNOSIS — O09219 Supervision of pregnancy with history of pre-term labor, unspecified trimester: Secondary | ICD-10-CM

## 2013-08-10 DIAGNOSIS — O2390 Unspecified genitourinary tract infection in pregnancy, unspecified trimester: Secondary | ICD-10-CM | POA: Insufficient documentation

## 2013-08-10 DIAGNOSIS — O4700 False labor before 37 completed weeks of gestation, unspecified trimester: Secondary | ICD-10-CM

## 2013-08-10 DIAGNOSIS — N760 Acute vaginitis: Secondary | ICD-10-CM

## 2013-08-10 LAB — WET PREP, GENITAL
TRICH WET PREP: NONE SEEN
YEAST WET PREP: NONE SEEN

## 2013-08-10 LAB — URINALYSIS, ROUTINE W REFLEX MICROSCOPIC
Bilirubin Urine: NEGATIVE
GLUCOSE, UA: 100 mg/dL — AB
HGB URINE DIPSTICK: NEGATIVE
Ketones, ur: NEGATIVE mg/dL
Nitrite: NEGATIVE
PH: 6.5 (ref 5.0–8.0)
PROTEIN: NEGATIVE mg/dL
Specific Gravity, Urine: 1.015 (ref 1.005–1.030)
Urobilinogen, UA: 1 mg/dL (ref 0.0–1.0)

## 2013-08-10 LAB — URINE MICROSCOPIC-ADD ON

## 2013-08-10 LAB — OB RESULTS CONSOLE GC/CHLAMYDIA
Chlamydia: NEGATIVE
Gonorrhea: NEGATIVE

## 2013-08-10 LAB — FETAL FIBRONECTIN: Fetal Fibronectin: NEGATIVE

## 2013-08-10 MED ORDER — METRONIDAZOLE 500 MG PO TABS: 500.0000 mg | ORAL_TABLET | Freq: Two times a day (BID) | ORAL | Status: DC

## 2013-08-10 MED ORDER — BETAMETHASONE SOD PHOS & ACET 6 (3-3) MG/ML IJ SUSP: 12.5000 mg | Freq: Once | INTRAMUSCULAR | Status: DC

## 2013-08-10 MED ORDER — LACTATED RINGERS IV BOLUS (SEPSIS)
1000.0000 mL | Freq: Once | INTRAVENOUS | Status: AC
  Administered 2013-08-10: 1000 mL via INTRAVENOUS

## 2013-08-10 MED ORDER — BETAMETHASONE SOD PHOS & ACET 6 (3-3) MG/ML IJ SUSP
12.0000 mg | Freq: Once | INTRAMUSCULAR | Status: AC
  Administered 2013-08-10: 12 mg via INTRAMUSCULAR
  Filled 2013-08-10: qty 2

## 2013-08-10 MED ORDER — NIFEDIPINE 10 MG PO CAPS
10.0000 mg | ORAL_CAPSULE | ORAL | Status: AC | PRN
  Administered 2013-08-10 (×3): 10 mg via ORAL
  Filled 2013-08-10 (×3): qty 1

## 2013-08-10 NOTE — MAU Note (Signed)
Patient presents to MAU with c/o possible contractions. Denies LOF or VB at this time. Unsure of due date. Last LMP 01/02/14. Has appt with clinic June 22 with [facility]. No prenatal care up to this point; issues with obtaining appointment.

## 2013-08-10 NOTE — Progress Notes (Signed)
Philipp Deputy CNM in to discuss test results and d/c plan. Written and verbal d/c instructions given and understanding voiced

## 2013-08-10 NOTE — Progress Notes (Signed)
Pt understands to return tomorrow night around 2100 (per Philipp Deputy CNM) for 2nd betamethasone inj

## 2013-08-10 NOTE — Discharge Instructions (Signed)
Preterm Labor Information Preterm labor is when labor starts at less than 37 weeks of pregnancy. The normal length of a pregnancy is 39 to 41 weeks. CAUSES Often, there is no identifiable underlying cause as to why a woman goes into preterm labor. One of the most common known causes of preterm labor is infection. Infections of the uterus, cervix, vagina, amniotic sac, bladder, kidney, or even the lungs (pneumonia) can cause labor to start. Other suspected causes of preterm labor include:   Urogenital infections, such as yeast infections and bacterial vaginosis.   Uterine abnormalities (uterine shape, uterine septum, fibroids, or bleeding from the placenta).   A cervix that has been operated on (it may fail to stay closed).   Malformations in the fetus.   Multiple gestations (twins, triplets, and so on).   Breakage of the amniotic sac.  RISK FACTORS  Having a previous history of preterm labor.   Having premature rupture of membranes (PROM).   Having a placenta that covers the opening of the cervix (placenta previa).   Having a placenta that separates from the uterus (placental abruption).   Having a cervix that is too weak to hold the fetus in the uterus (incompetent cervix).   Having too much fluid in the amniotic sac (polyhydramnios).   Taking illegal drugs or smoking while pregnant.   Not gaining enough weight while pregnant.   Being younger than 18 and older than 21 years old.   Having a low socioeconomic status.   Being African American. SYMPTOMS Signs and symptoms of preterm labor include:   Menstrual-like cramps, abdominal pain, or back pain.  Uterine contractions that are regular, as frequent as six in an hour, regardless of their intensity (may be mild or painful).  Contractions that start on the top of the uterus and spread down to the lower abdomen and back.   A sense of increased pelvic pressure.   A watery or bloody mucus discharge that  comes from the vagina.  TREATMENT Depending on the length of the pregnancy and other circumstances, your health care provider may suggest bed rest. If necessary, there are medicines that can be given to stop contractions and to mature the fetal lungs. If labor happens before 34 weeks of pregnancy, a prolonged hospital stay may be recommended. Treatment depends on the condition of both you and the fetus.  WHAT SHOULD YOU DO IF YOU THINK YOU ARE IN PRETERM LABOR? Call your health care provider right away. You will need to go to the hospital to get checked immediately. HOW CAN YOU PREVENT PRETERM LABOR IN FUTURE PREGNANCIES? You should:   Stop smoking if you smoke.  Maintain healthy weight gain and avoid chemicals and drugs that are not necessary.  Be watchful for any type of infection.  Inform your health care provider if you have a known history of preterm labor. Document Released: 05/15/2003 Document Revised: 10/25/2012 Document Reviewed: 03/27/2012 [employer] Patient Information 2014 [employer].    

## 2013-08-10 NOTE — MAU Provider Note (Signed)
Chief Complaint:  Contractions   [name] is a 23 y.o.  [name] with IUP at 1878w2d (by U/S tonight) presenting for Contractions She presented with contractions. She has had no prenatal care up to this point. Her contractions were strong and consistent upon arrival.  Her contractions have improved since arrival to MAU.  She has a history of PPROM and demise at 19.3 weeks with previous pregnancy. Last pregnancy 11/24/12 and went into PTL delivering a live born female.  By uncertain LMP pt would be approx 31wks.  Menstrual History: OB History   Grav Para Term Preterm Abortions TAB SAB Ect Mult Living   3 2 0 2 0 0 0 0 0 1       Patient's last menstrual period was 01/02/2013.      Past Medical History  Diagnosis Date  . No pertinent past medical history   . UTI (lower urinary tract infection)   . Anemia   . Preterm delivery     Past Surgical History  Procedure Laterality Date  . No past surgeries      History reviewed. No pertinent family history.  History  Substance Use Topics  . Smoking status: Never Smoker   . Smokeless tobacco: Never Used  . Alcohol Use: No     No Known Allergies  Prescriptions prior to admission  Medication Sig Dispense Refill  . Ferrous Sulfate ([facility] SLOW RELEASE IRON) 143 (45 FE) MG TBCR Take 1 tablet by mouth daily.  30 tablet  6  . Prenatal Vit-Fe Fumarate-FA (PRENATAL MULTIVITAMIN) TABS Take 1 tablet by mouth daily at 12 noon.        Review of Systems - Negative except for what is mentioned in HPI.  Physical Exam  Blood pressure 101/57, pulse 105, temperature 98.5 F (36.9 C), temperature source Oral, resp. rate 18, height 5\' 3"  (1.6 m), weight 50.259 kg (110 lb 12.8 oz), last menstrual period 01/02/2013, SpO2 100.00%, unknown if currently breastfeeding. GENERAL: Well-developed, well-nourished female in no acute distress.  ABDOMEN: Soft, nontender, nondistended, gravid.  EXTREMITIES: Nontender, no edema, 2+ distal pulses. Cervical Exam:  Dilatation 1 cm   Effacement thick   Station -3, firm    Presentation: cephalic FHT:  Baseline rate 140 bpm   Variability moderate  Accelerations present   Decelerations none Contractions: Every 5 mins   Labs: Results for orders placed during the hospital encounter of 08/10/13 (from the past 24 hour(s))  URINALYSIS, ROUTINE W REFLEX MICROSCOPIC   Collection Time    08/10/13  7:09 PM      Result Value Ref Range   Color, Urine YELLOW  YELLOW   APPearance HAZY (*) CLEAR   Specific Gravity, Urine 1.015  1.005 - 1.030   pH 6.5  5.0 - 8.0   Glucose, UA 100 (*) NEGATIVE mg/dL   Hgb urine dipstick NEGATIVE  NEGATIVE   Bilirubin Urine NEGATIVE  NEGATIVE   Ketones, ur NEGATIVE  NEGATIVE mg/dL   Protein, ur NEGATIVE  NEGATIVE mg/dL   Urobilinogen, UA 1.0  0.0 - 1.0 mg/dL   Nitrite NEGATIVE  NEGATIVE   Leukocytes, UA LARGE (*) NEGATIVE  URINE MICROSCOPIC-ADD ON   Collection Time    08/10/13  7:09 PM      Result Value Ref Range   Squamous Epithelial / LPF MANY (*) RARE   WBC, UA 11-20  <3 WBC/hpf   RBC / HPF 0-2  <3 RBC/hpf   Bacteria, UA RARE  RARE   Urine-Other MUCOUS PRESENT  FETAL FIBRONECTIN   Collection Time    08/10/13  7:32 PM      Result Value Ref Range   Fetal Fibronectin NEGATIVE  NEGATIVE  WET PREP, GENITAL   Collection Time    08/10/13  7:33 PM      Result Value Ref Range   Yeast Wet Prep HPF POC NONE SEEN  NONE SEEN   Trich, Wet Prep NONE SEEN  NONE SEEN   Clue Cells Wet Prep HPF POC MODERATE (*) NONE SEEN   WBC, Wet Prep HPF POC MANY (*) NONE SEEN    Imaging Studies:  No results found.  Assessment: [name] is  23 y.o. G3P0201 at [redacted]w[redacted]d presents with Contractions  1932 fetal fibronectin negative   2207 [place] completed   2310 She is not feeling her contractions s/p procardia x 3. Received a 1L bolus of LR. Will give betamethasone x 1 tonight.   Plan: Discharge to home.   #BV: wet prep with moderate clue cells  - flagyl 500 mg PO for 7 days   #UA:  large leukocytes on sample but a dirty catch  - urine cx: pending   #Preterm labor: cervix is closed and not feeling ctx s/p procardia x 3. Maybe be associated with BV and/or dehydration. history of PPROM and demise at 19.3 weeks.   - betamethasone x 1 tonight  - future order of betamethasone tomorrow night.  - given return precautions for return   #lack of PNC: patient hasn't received any PNC yet. Current appt is June 22 but will sent message to women[facility] clinic to schedule earlier.   #Short interpregnancy interval: Last pregnancy 11/24/12  [name] 6/5/201511:19 PM   I have seen and examined this patient and I agree with the above. [name] CNM 11:35 PM 08/10/2013

## 2013-08-11 ENCOUNTER — Inpatient Hospital Stay (HOSPITAL_COMMUNITY)
Admission: AD | Admit: 2013-08-11 | Discharge: 2013-08-11 | Disposition: A | Discharge status: Home / Self Care | Payer: Medicaid Other | Source: Ambulatory Visit | Attending: Obstetrics & Gynecology | Admitting: Obstetrics & Gynecology

## 2013-08-11 DIAGNOSIS — O4700 False labor before 37 completed weeks of gestation, unspecified trimester: Secondary | ICD-10-CM | POA: Insufficient documentation

## 2013-08-11 LAB — GC/CHLAMYDIA PROBE AMP
CT PROBE, AMP APTIMA: NEGATIVE
GC PROBE AMP APTIMA: NEGATIVE

## 2013-08-11 MED ORDER — BETAMETHASONE SOD PHOS & ACET 6 (3-3) MG/ML IJ SUSP
12.0000 mg | Freq: Once | INTRAMUSCULAR | Status: AC
  Administered 2013-08-11: 12 mg via INTRAMUSCULAR
  Filled 2013-08-11: qty 2

## 2013-08-11 NOTE — MAU Note (Addendum)
PT HAS RETURNED  FOR 2ND DOSE OF BETAMETHASONE-    DENIES ANY CRAMPING, UC, BLEEDING, PRESSURE. FHR--143

## 2013-08-12 LAB — URINE CULTURE
COLONY COUNT: NO GROWTH
CULTURE: NO GROWTH

## 2013-08-12 NOTE — MAU Provider Note (Signed)
Attestation of Attending Supervision of Advanced Practitioner: Evaluation and management procedures were performed by the PA/NP/CNM/OB Fellow under my supervision/collaboration. Chart reviewed and agree with management and plan.  Christin Bach V 08/12/2013 7:51 AM

## 2013-08-17 ENCOUNTER — Encounter: Payer: Self-pay | Admitting: Family Medicine

## 2013-08-17 ENCOUNTER — Encounter: Payer: Self-pay | Admitting: Obstetrics & Gynecology

## 2013-08-17 DIAGNOSIS — N760 Acute vaginitis: Principal | ICD-10-CM

## 2013-08-17 DIAGNOSIS — B9689 Other specified bacterial agents as the cause of diseases classified elsewhere: Secondary | ICD-10-CM

## 2013-08-27 ENCOUNTER — Encounter: Payer: Medicaid Other | Admitting: Obstetrics and Gynecology

## 2013-08-27 MED ORDER — METRONIDAZOLE 500 MG PO TABS: 500.0000 mg | ORAL_TABLET | Freq: Two times a day (BID) | ORAL | Status: DC

## 2013-08-27 NOTE — Telephone Encounter (Signed)
Per chart review patient had appt today. Called patient and she stated that she never received the flagyl Rx, informed patient med sent to pharmacy and asked if she knew about her appt this morning.patient stated yes and she was working on getting it rescheduled. Patient had no other questions

## 2013-09-04 ENCOUNTER — Encounter (HOSPITAL_COMMUNITY): Payer: Self-pay | Admitting: General Practice

## 2013-09-04 ENCOUNTER — Inpatient Hospital Stay (HOSPITAL_COMMUNITY)
Admission: AD | Admit: 2013-09-04 | Discharge: 2013-09-05 | DRG: 778 | Disposition: A | Discharge status: Home / Self Care | Payer: Medicaid Other | Source: Ambulatory Visit | Attending: Obstetrics & Gynecology | Admitting: Obstetrics & Gynecology

## 2013-09-04 DIAGNOSIS — O4700 False labor before 37 completed weeks of gestation, unspecified trimester: Principal | ICD-10-CM | POA: Diagnosis present

## 2013-09-04 DIAGNOSIS — Z3490 Encounter for supervision of normal pregnancy, unspecified, unspecified trimester: Secondary | ICD-10-CM

## 2013-09-04 LAB — URINALYSIS, ROUTINE W REFLEX MICROSCOPIC
BILIRUBIN URINE: NEGATIVE
GLUCOSE, UA: 250 mg/dL — AB
Hgb urine dipstick: NEGATIVE
Ketones, ur: NEGATIVE mg/dL
Nitrite: NEGATIVE
PH: 6 (ref 5.0–8.0)
Protein, ur: NEGATIVE mg/dL
Specific Gravity, Urine: 1.01 (ref 1.005–1.030)
Urobilinogen, UA: 0.2 mg/dL (ref 0.0–1.0)

## 2013-09-04 LAB — CBC
HCT: 33.5 % — ABNORMAL LOW (ref 36.0–46.0)
HEMOGLOBIN: 10.5 g/dL — AB (ref 12.0–15.0)
MCH: 25.4 pg — ABNORMAL LOW (ref 26.0–34.0)
MCHC: 31.3 g/dL (ref 30.0–36.0)
MCV: 81.1 fL (ref 78.0–100.0)
Platelets: 244 10*3/uL (ref 150–400)
RBC: 4.13 MIL/uL (ref 3.87–5.11)
RDW: 14.4 % (ref 11.5–15.5)
WBC: 8 10*3/uL (ref 4.0–10.5)

## 2013-09-04 LAB — URINE MICROSCOPIC-ADD ON

## 2013-09-04 LAB — RAPID URINE DRUG SCREEN, HOSP PERFORMED
Amphetamines: NOT DETECTED
BENZODIAZEPINES: NOT DETECTED
Barbiturates: NOT DETECTED
COCAINE: NOT DETECTED
OPIATES: NOT DETECTED
Tetrahydrocannabinol: NOT DETECTED

## 2013-09-04 LAB — GROUP B STREP BY PCR: GROUP B STREP BY PCR: NEGATIVE

## 2013-09-04 LAB — RAPID HIV SCREEN (WH-MAU): SUDS RAPID HIV SCREEN: NONREACTIVE

## 2013-09-04 LAB — TYPE AND SCREEN
ABO/RH(D): AB POS
ANTIBODY SCREEN: NEGATIVE

## 2013-09-04 LAB — OB RESULTS CONSOLE GBS: GBS: NEGATIVE

## 2013-09-04 LAB — OB RESULTS CONSOLE HIV ANTIBODY (ROUTINE TESTING): HIV: NONREACTIVE

## 2013-09-04 MED ORDER — LACTATED RINGERS IV SOLN
INTRAVENOUS | Status: DC
  Administered 2013-09-04: 22:00:00 via INTRAVENOUS

## 2013-09-04 MED ORDER — OXYCODONE-ACETAMINOPHEN 5-325 MG PO TABS: 1.0000 | ORAL_TABLET | ORAL | Status: DC | PRN

## 2013-09-04 MED ORDER — LACTATED RINGERS IV SOLN: 500.0000 mL | INTRAVENOUS | Status: DC | PRN

## 2013-09-04 MED ORDER — LIDOCAINE HCL (PF) 1 % IJ SOLN: 30.0000 mL | INTRAMUSCULAR | Status: DC | PRN

## 2013-09-04 MED ORDER — IBUPROFEN 600 MG PO TABS: 600.0000 mg | ORAL_TABLET | Freq: Four times a day (QID) | ORAL | Status: DC | PRN

## 2013-09-04 MED ORDER — OXYTOCIN BOLUS FROM INFUSION
500.0000 mL | INTRAVENOUS | Status: DC
Start: 2013-09-04 — End: 2013-09-05

## 2013-09-04 MED ORDER — PENICILLIN G POTASSIUM 5000000 UNITS IJ SOLR
5.0000 10*6.[IU] | Freq: Once | INTRAMUSCULAR | Status: DC
  Administered 2013-09-04: 5 10*6.[IU] via INTRAVENOUS
  Filled 2013-09-04: qty 5

## 2013-09-04 MED ORDER — CITRIC ACID-SODIUM CITRATE 334-500 MG/5ML PO SOLN: 30.0000 mL | ORAL | Status: DC | PRN

## 2013-09-04 MED ORDER — NIFEDIPINE 10 MG PO CAPS
10.0000 mg | ORAL_CAPSULE | Freq: Once | ORAL | Status: AC
  Administered 2013-09-04: 10 mg via ORAL
  Filled 2013-09-04: qty 1

## 2013-09-04 MED ORDER — FLEET ENEMA 7-19 GM/118ML RE ENEM: 1.0000 | ENEMA | RECTAL | Status: DC | PRN

## 2013-09-04 MED ORDER — ONDANSETRON HCL 4 MG/2ML IJ SOLN: 4.0000 mg | Freq: Four times a day (QID) | INTRAMUSCULAR | Status: DC | PRN

## 2013-09-04 MED ORDER — NALBUPHINE HCL 10 MG/ML IJ SOLN: 10.0000 mg | INTRAMUSCULAR | Status: DC | PRN

## 2013-09-04 MED ORDER — LACTATED RINGERS IV SOLN: INTRAVENOUS | Status: DC

## 2013-09-04 MED ORDER — OXYTOCIN 40 UNITS IN LACTATED RINGERS INFUSION - SIMPLE MED: 62.5000 mL/h | INTRAVENOUS | Status: DC

## 2013-09-04 MED ORDER — TERBUTALINE SULFATE 1 MG/ML IJ SOLN
0.2500 mg | Freq: Once | INTRAMUSCULAR | Status: AC
  Administered 2013-09-04: 0.25 mg via SUBCUTANEOUS
  Filled 2013-09-04: qty 1

## 2013-09-04 MED ORDER — MAGNESIUM SULFATE 40 G IN LACTATED RINGERS - SIMPLE: 2.0000 g/h | INTRAVENOUS | Status: DC

## 2013-09-04 MED ORDER — MAGNESIUM SULFATE BOLUS VIA INFUSION
4.0000 g | Freq: Once | INTRAVENOUS | Status: DC
  Administered 2013-09-04: 4 g via INTRAVENOUS
  Filled 2013-09-04: qty 500

## 2013-09-04 MED ORDER — MAGNESIUM SULFATE 40 G IN LACTATED RINGERS - SIMPLE
2.0000 g/h | INTRAVENOUS | Status: DC
  Filled 2013-09-04: qty 500

## 2013-09-04 MED ORDER — PENICILLIN G POTASSIUM 5000000 UNITS IJ SOLR
2.5000 10*6.[IU] | INTRAVENOUS | Status: DC
  Filled 2013-09-04 (×2): qty 2.5

## 2013-09-04 MED ORDER — ACETAMINOPHEN 325 MG PO TABS: 650.0000 mg | ORAL_TABLET | ORAL | Status: DC | PRN

## 2013-09-04 NOTE — MAU Provider Note (Signed)
Chief Complaint:  Contractions and Back Pain   [name] is a 23 y.o.  [id number] with IUP at 1048w6d presenting for Contractions and Back Pain  She presented with contractions. She has had no prenatal care up to this point adn missed her appt on 6/22. Her contractions were strong and consistent upon arrival. Her contractions have improved since arrival to MAU. She has a history of PPROM and delivery at 33 weeks and demise at 19.3 weeks with previous pregnancy. Last pregnancy 11/24/12 and went into PTL delivering a live born female.   She is s/p BMZ on 6/5 and 6/6.       Menstrual History: OB History   Grav Para Term Preterm Abortions TAB SAB Ect Mult Living   3 2 0 2 0 0 0 0 0 1       G1- PPROM @ 19 weeks G2- preterm 967w3d  G3- current   Patient's last menstrual period was 01/02/2013.      Past Medical History  Diagnosis Date  . No pertinent past medical history   . UTI (lower urinary tract infection)   . Anemia   . Preterm delivery     Past Surgical History  Procedure Laterality Date  . No past surgeries      History reviewed. No pertinent family history.  History  Substance Use Topics  . Smoking status: Never Smoker   . Smokeless tobacco: Never Used  . Alcohol Use: No     No Known Allergies  Prescriptions prior to admission  Medication Sig Dispense Refill  . Ferrous Sulfate ([facility]S SLOW RELEASE IRON) 143 (45 FE) MG TBCR Take 1 tablet by mouth daily.  30 tablet  6  . metroNIDAZOLE (FLAGYL) 500 MG tablet Take 1 tablet (500 mg total) by mouth 2 (two) times daily.  14 tablet  0  . Prenatal Vit-Fe Fumarate-FA (PRENATAL MULTIVITAMIN) TABS Take 1 tablet by mouth daily at 12 noon.        Review of Systems - Negative except for what is mentioned in HPI.  Physical Exam  Blood pressure 86/57, pulse 114, temperature 98.6 F (37 C), temperature source Oral, resp. rate 18, height 5\' 2"  (1.575 m), weight 51.71 kg (114 lb), last menstrual period 01/02/2013, unknown if  currently breastfeeding. GENERAL: Well-developed, well-nourished female in no acute distress.  LUNGS: Clear to auscultation bilaterally.  HEART: Regular rate and rhythm. ABDOMEN: Soft, nontender, nondistended, gravid.  EXTREMITIES: Nontender, no edema, 2+ distal pulses. Cervical Exam: Dilatation 3cm   Effacement 50%   Station -2   Presentation: cephalic FHT:  Baseline rate 145 bpm   Variability moderate  Accelerations present   Decelerations none Contractions: initially every 2-3 min and now irregular with some irritability.    Labs: Results for orders placed during the hospital encounter of 09/04/13 (from the past 24 hour(s))  URINALYSIS, ROUTINE W REFLEX MICROSCOPIC   Collection Time    09/04/13  5:17 PM      Result Value Ref Range   Color, Urine YELLOW  YELLOW   APPearance CLEAR  CLEAR   Specific Gravity, Urine 1.010  1.005 - 1.030   pH 6.0  5.0 - 8.0   Glucose, UA 250 (*) NEGATIVE mg/dL   Hgb urine dipstick NEGATIVE  NEGATIVE   Bilirubin Urine NEGATIVE  NEGATIVE   Ketones, ur NEGATIVE  NEGATIVE mg/dL   Protein, ur NEGATIVE  NEGATIVE mg/dL   Urobilinogen, UA 0.2  0.0 - 1.0 mg/dL   Nitrite NEGATIVE  NEGATIVE   Leukocytes,  UA MODERATE (*) NEGATIVE  URINE MICROSCOPIC-ADD ON   Collection Time    09/04/13  5:17 PM      Result Value Ref Range   Squamous Epithelial / LPF MANY (*) RARE   WBC, UA 11-20  <3 WBC/hpf   Bacteria, UA FEW (*) RARE    Imaging Studies:  [place] Ob Comp + 14 Wk  08/12/2013   OBSTETRICAL ULTRASOUND: This exam was performed within a [place] [facility] Department. The OB US report was generated in the AS system, and faxed to the ordering physician.   This report is available in the [facility] PACS. See the AS Obstetric US report via the Image Link.   Assessment: [name] is  23 y.o. [phone] at 5444w6d presents with Contractions and Back Pain  Pt presented with regular q2-313min contractions and these improved dramatically after fluids and a single dose of  procardia 10mg . Her cervix by nursing was 1.5cm at admission but on my check was 3cm.    She was monitored for 2 hours and then rechecked and her cervix was changed despite terbutaline. .   Plan: Will admit to L&D See separate H&P  BECK, KELI L 6/30/20156:38 PM

## 2013-09-04 NOTE — H&P (Signed)
LABOR ADMISSION HISTORY AND PHYSICAL  [name] [phone] with IUP at 546w6d by 30 week scan presenting for contractions.   Contractions started last night but over the last 6 hours have worsened and become more regular. Every 4-5 min.  She has had no PNC up to now and missed her new ob visit on 6/22.  In the MAU she was given fluids, procardia x1 and a dose of terbutaline which seeem d to help for a little while and then her contractions worsened again. She changed her cervix from 1.5 to 4cm and the decision was made to admit her for prolonged observation.    PNCare in MAU x3 visits. Short interval between pregnancies. Hx of demise at 4951w3d. PTL at 33 weeks with last pregnancy delivering in 11/2012. She has received BMZ x2 this pregnancy.    Prenatal History/Complications:  Past Medical History: Past Medical History  Diagnosis Date  . No pertinent past medical history   . UTI (lower urinary tract infection)   . Anemia   . Preterm delivery     Past Surgical History: Past Surgical History  Procedure Laterality Date  . No past surgeries      Obstetrical History: OB History   Grav Para Term Preterm Abortions TAB SAB Ect Mult Living   3 2 0 2 0 0 0 0 0 1        Social History: History   Social History  . Marital Status: Single    Spouse Name: N/A    Number of Children: N/A  . Years of Education: N/A   Social History Main Topics  . Smoking status: Never Smoker   . Smokeless tobacco: Never Used  . Alcohol Use: No  . Drug Use: No  . Sexual Activity: Yes    Birth Control/ Protection: None     Comment: Depo 08/04/2010   Other Topics Concern  . None   Social History Narrative  . None    Family History: History reviewed. No pertinent family history.  Allergies: No Known Allergies  Prescriptions prior to admission  Medication Sig Dispense Refill  . Ferrous Sulfate (CVS SLOW RELEASE IRON) 143 (45 FE) MG TBCR Take 1 tablet by mouth daily.  30  tablet  6  . metroNIDAZOLE (FLAGYL) 500 MG tablet Take 1 tablet (500 mg total) by mouth 2 (two) times daily.  14 tablet  0  . Prenatal Vit-Fe Fumarate-FA (PRENATAL MULTIVITAMIN) TABS Take 1 tablet by mouth daily at 12 noon.         Review of Systems   All systems reviewed and negative except as stated in HPI  Blood pressure 108/63, pulse 112, temperature 98.6 F (37 C), temperature source Oral, resp. rate 18, height 5\' 2"  (1.575 m), weight 51.71 kg (114 lb), last menstrual period 01/02/2013, SpO2 100.00%, unknown if currently breastfeeding. General appearance: alert, cooperative and no distress Lungs: clear to auscultation bilaterally Heart: regular rate and rhythm Abdomen: soft, non-tender; bowel sounds normal Pelvic: 4/50/-2 BBOW Extremities: Homans sign is negative, no sign of DVT  Presentation: cephalic Fetal monitoringBaseline: 145 bpm, Variability: Good {> 6 bpm), Accelerations: Reactive and Decelerations: Absent Uterine activity irregular contractions    Dilation: 4 Effacement (%): 50 Station: -2 Exam by:: Dia CrawfordBeck, Kelli, MD   Prenatal labs: ABO, Rh: --/--/AB POS (06/30 1810) Antibody: NEG (06/30 1810) Rubella:   RPR: NON REACTIVE (09/19 1505)  HBsAg: NEGATIVE (07/21 1200)  HIV: NON REACTIVE (07/21 1200)  GBS:   PCR pending  1 hr Glucola not done Genetic screening  Not done Anatomy US normal    Prenatal Transfer Tool  Maternal Diabetes: No-unknown Genetic Screening: Declined Maternal Ultrasounds/Referrals: Normal Fetal Ultrasounds or other Referrals:  None Maternal Substance Abuse:  No Significant Maternal Medications:  None Significant Maternal Lab Results: None     Results for orders placed during the hospital encounter of 09/04/13 (from the past 24 hour(s))  URINALYSIS, ROUTINE W REFLEX MICROSCOPIC   Collection Time    09/04/13  5:17 PM      Result Value Ref Range   Color, Urine YELLOW  YELLOW   APPearance CLEAR  CLEAR   Specific Gravity, Urine  1.010  1.005 - 1.030   pH 6.0  5.0 - 8.0   Glucose, UA 250 (*) NEGATIVE mg/dL   Hgb urine dipstick NEGATIVE  NEGATIVE   Bilirubin Urine NEGATIVE  NEGATIVE   Ketones, ur NEGATIVE  NEGATIVE mg/dL   Protein, ur NEGATIVE  NEGATIVE mg/dL   Urobilinogen, UA 0.2  0.0 - 1.0 mg/dL   Nitrite NEGATIVE  NEGATIVE   Leukocytes, UA MODERATE (*) NEGATIVE  URINE MICROSCOPIC-ADD ON   Collection Time    09/04/13  5:17 PM      Result Value Ref Range   Squamous Epithelial / LPF MANY (*) RARE   WBC, UA 11-20  <3 WBC/hpf   Bacteria, UA FEW (*) RARE  CBC   Collection Time    09/04/13  6:10 PM      Result Value Ref Range   WBC 8.0  4.0 - 10.5 K/uL   RBC 4.13  3.87 - 5.11 MIL/uL   Hemoglobin 10.5 (*) 12.0 - 15.0 g/dL   HCT 40.933.5 (*) 81.136.0 - 91.446.0 %   MCV 81.1  78.0 - 100.0 fL   MCH 25.4 (*) 26.0 - 34.0 pg   MCHC 31.3  30.0 - 36.0 g/dL   RDW 78.214.4  95.611.5 - 21.315.5 %   Platelets 244  150 - 400 K/uL  TYPE AND SCREEN   Collection Time    09/04/13  6:10 PM      Result Value Ref Range   ABO/RH(D) AB POS     Antibody Screen NEG     Sample Expiration 09/07/2013      Assessment: [name] is a [age] y.o. [id number] at 3256w6d here for threatened PTL.    #Threatened PT Labor: will monitor on [name]&D. In 3 hours she will be 34 weeks so will hold on any more tocolysis currently. Will monitor closely and recheck only if becomes more uncomfortable.  #Pain: nubain prn #FWB: Cat I tracing #ID:  GBS unknown. Will send PCR. Treat with PCN until results for GBS unknown and preterm  #MOF:  bottle  [name] 09/04/2013, 8:31 PM

## 2013-09-04 NOTE — MAU Note (Signed)
Having contractions and back pain. Started yesterday, comes every .  Has hx of PTL and PTD.

## 2013-09-05 LAB — RPR

## 2013-09-05 NOTE — Discharge Instructions (Signed)

## 2013-09-05 NOTE — Progress Notes (Signed)
Tolerated meal.

## 2013-09-05 NOTE — Progress Notes (Signed)
Discharged to home via wheelchair with family.

## 2013-09-05 NOTE — Discharge Summary (Signed)
Antenatal Physician Discharge Summary  Patient ID: [name] MRN: [id number] DOB/AGE: 21/03/1990 22 y.o.  Admit date: 09/04/2013 Discharge date: 09/05/2013  Admission Diagnoses: threatened PTL  Discharge Diagnoses: threatened PTL  Prenatal Procedures: NST  Significant Diagnostic Studies:  Results for orders placed during the hospital encounter of 09/04/13 (from the past 168 hour(s))  OB RESULTS CONSOLE GBS   Collection Time    09/04/13 12:00 AM      Result Value Ref Range   GBS Negative    OB RESULTS CONSOLE HIV ANTIBODY (ROUTINE TESTING)   Collection Time    09/04/13 12:00 AM      Result Value Ref Range   HIV Non-reactive    URINALYSIS, ROUTINE W REFLEX MICROSCOPIC   Collection Time    09/04/13  5:17 PM      Result Value Ref Range   Color, Urine YELLOW  YELLOW   APPearance CLEAR  CLEAR   Specific Gravity, Urine 1.010  1.005 - 1.030   pH 6.0  5.0 - 8.0   Glucose, UA 250 (*) NEGATIVE mg/dL   Hgb urine dipstick NEGATIVE  NEGATIVE   Bilirubin Urine NEGATIVE  NEGATIVE   Ketones, ur NEGATIVE  NEGATIVE mg/dL   Protein, ur NEGATIVE  NEGATIVE mg/dL   Urobilinogen, UA 0.2  0.0 - 1.0 mg/dL   Nitrite NEGATIVE  NEGATIVE   Leukocytes, UA MODERATE (*) NEGATIVE  URINE MICROSCOPIC-ADD ON   Collection Time    09/04/13  5:17 PM      Result Value Ref Range   Squamous Epithelial / LPF MANY (*) RARE   WBC, UA 11-20  <3 WBC/hpf   Bacteria, UA FEW (*) RARE  URINE RAPID DRUG SCREEN (HOSP PERFORMED)   Collection Time    09/04/13  5:25 PM      Result Value Ref Range   Opiates NONE DETECTED  NONE DETECTED   Cocaine NONE DETECTED  NONE DETECTED   Benzodiazepines NONE DETECTED  NONE DETECTED   Amphetamines NONE DETECTED  NONE DETECTED   Tetrahydrocannabinol NONE DETECTED  NONE DETECTED   Barbiturates NONE DETECTED  NONE DETECTED  CBC   Collection Time    09/04/13  6:10 PM      Result Value Ref Range   WBC 8.0  4.0 - 10.5 K/uL   RBC 4.13  3.87 - 5.11 MIL/uL   Hemoglobin 10.5 (*)  12.0 - 15.0 g/dL   HCT 62.133.5 (*) 30.836.0 - 65.746.0 %   MCV 81.1  78.0 - 100.0 fL   MCH 25.4 (*) 26.0 - 34.0 pg   MCHC 31.3  30.0 - 36.0 g/dL   RDW 84.614.4  96.211.5 - 95.215.5 %   Platelets 244  150 - 400 K/uL  RAPID HIV SCREEN [facility])   Collection Time    09/04/13  6:10 PM      Result Value Ref Range   SUDS Rapid HIV Screen NON REACTIVE  NON REACTIVE  RPR   Collection Time    09/04/13  6:10 PM      Result Value Ref Range   RPR NON REAC  NON REAC  TYPE AND SCREEN   Collection Time    09/04/13  6:10 PM      Result Value Ref Range   ABO/RH(D) AB POS     Antibody Screen NEG     Sample Expiration 09/07/2013    GROUP B STREP BY PCR   Collection Time    09/04/13  8:30 PM      Result Value  Ref Range   Group B strep by PCR NEGATIVE  NEGATIVE    Treatments: IV hydration and procardia x1  Hospital Course:  This is a 23 y.o. [name] with IUP at 6331w0d by 30week US admitted for threatened PTL. She was initially seen in the MAU and given fluids, procardia, and terbutaline x1 and continued to have contractions regularly at 7574w6d.  Her cervix changed from 3 to 4 on my check.  She was admitted with contractions, noted to have a cervical exam of 4/40/-2 and firm.  No leaking of fluid and no bleeding.  Given she was 34 weeks at time of admission, no tocolysis was started. She was given BMZ x2 previously on 6/5 and 6/6. Overnight her contractions spaced out to every 5-7 min and were no longer felt by the pt.  She was observed, fetal heart rate monitoring remained reassuring, and she had no signs/symptoms of progressing preterm labor or other maternal-fetal concerns.  Her cervical exam was unchanged from admission.  She was deemed stable for discharge to home with outpatient follow up. Strong PTL precautions were given.    Discharge Exam: BP 100/55  Pulse 72  Temp(Src) 97.6 F (36.4 C) (Oral)  Resp 18  Ht 5\' 3"  (1.6 m)  Wt 52.164 kg (115 lb)  BMI 20.38 kg/m2  SpO2 100%  LMP 01/02/2013 General appearance:  alert, cooperative and no distress Resp: clear to auscultation bilaterally Cardio: regular rate and rhythm, S1, S2 normal, no murmur, click, rub or gallop GI: soft, non-tender; bowel sounds normal; no masses,  no organomegaly Extremities: extremities normal, atraumatic, no cyanosis or edema Pulses: 2+ and symmetric  Discharge Condition: fair  Disposition: 01-Home or Self Care  Discharge Instructions   Discharge patient    Complete by:  As directed   To home            Medication List         Ferrous Sulfate 143 (45 FE) MG Tbcr  Commonly known as:  CVS SLOW RELEASE IRON  Take 1 tablet by mouth daily.     metroNIDAZOLE 500 MG tablet  Commonly known as:  FLAGYL  Take 1 tablet (500 mg total) by mouth 2 (two) times daily.     prenatal multivitamin Tabs tablet  Take 1 tablet by mouth daily at 12 noon.           Follow-up Information   Schedule an appointment as soon as possible for a [facility]. (as scheduled )    Specialty:  Obstetrics and Gynecology   Contact information:   [address] [place] [id number] [phone]      Signed: Vale HavenBECK, Lometa Riggin L M.D. 09/05/2013, 8:07 AM

## 2013-09-05 NOTE — Progress Notes (Signed)
Ordering breakfast.

## 2013-09-07 ENCOUNTER — Inpatient Hospital Stay (HOSPITAL_COMMUNITY)
Admission: AD | Admit: 2013-09-07 | Discharge: 2013-09-07 | Disposition: A | Discharge status: Home / Self Care | Payer: Medicaid Other | Source: Ambulatory Visit | Attending: Obstetrics and Gynecology | Admitting: Obstetrics and Gynecology

## 2013-09-07 ENCOUNTER — Encounter (HOSPITAL_COMMUNITY): Payer: Self-pay

## 2013-09-07 DIAGNOSIS — O4703 False labor before 37 completed weeks of gestation, third trimester: Secondary | ICD-10-CM

## 2013-09-07 DIAGNOSIS — O4700 False labor before 37 completed weeks of gestation, unspecified trimester: Secondary | ICD-10-CM | POA: Insufficient documentation

## 2013-09-07 LAB — URINALYSIS, ROUTINE W REFLEX MICROSCOPIC
Bilirubin Urine: NEGATIVE
Glucose, UA: NEGATIVE mg/dL
Hgb urine dipstick: NEGATIVE
KETONES UR: NEGATIVE mg/dL
Nitrite: NEGATIVE
PH: 7 (ref 5.0–8.0)
Protein, ur: NEGATIVE mg/dL
Specific Gravity, Urine: 1.01 (ref 1.005–1.030)
Urobilinogen, UA: 0.2 mg/dL (ref 0.0–1.0)

## 2013-09-07 LAB — URINE MICROSCOPIC-ADD ON

## 2013-09-07 MED ORDER — NIFEDIPINE ER OSMOTIC RELEASE 30 MG PO TB24
30.0000 mg | ORAL_TABLET | Freq: Every day | ORAL | Status: DC
  Administered 2013-09-07: 30 mg via ORAL
  Filled 2013-09-07: qty 1

## 2013-09-07 MED ORDER — NIFEDIPINE 10 MG PO CAPS: 10.0000 mg | ORAL_CAPSULE | Freq: Three times a day (TID) | ORAL | Status: DC

## 2013-09-07 MED ORDER — OXYCODONE-ACETAMINOPHEN 5-325 MG PO TABS
2.0000 | ORAL_TABLET | Freq: Once | ORAL | Status: AC
  Administered 2013-09-07: 2 via ORAL
  Filled 2013-09-07: qty 2

## 2013-09-07 NOTE — MAU Note (Signed)
Having contractions 3-4 min apart. Was 4+cm when here earlier in the wk.no leaking or bleeding

## 2013-09-07 NOTE — MAU Note (Signed)
Ctx's began around 0900 this am.

## 2013-09-07 NOTE — MAU Provider Note (Signed)
First Provider Initiated Contact with Patient 09/07/13 1252      Chief Complaint:  Labor Eval   [name] is  23 y.o. G3P0201 at 3263w2d by 30+2 US presents complaining of Labor Eval .  She states regular, every 4-5 minutes nonpainful contractions without vaginal bleeding since this morning. Pt states that she has not had vaginal bleeding or LOF. Pt with normal fetal movement. NO other complaints at this time.  Pt recently discharged 6/30 from threatened PTL with Cervix at 4/40/-2. Pt has had BMZ x2 on 6/5 and 6/6.  Obstetrical/Gynecological History: OB History   Grav Para Term Preterm Abortions TAB SAB Ect Mult Living   3 2 0 2 0 0 0 0 0 1      Past Medical History: Past Medical History  Diagnosis Date  . No pertinent past medical history   . UTI (lower urinary tract infection)   . Anemia   . Preterm delivery   . Stillbirth     Past Surgical History: Past Surgical History  Procedure Laterality Date  . No past surgeries      Family History: History reviewed. No pertinent family history.  Social History: History  Substance Use Topics  . Smoking status: Never Smoker   . Smokeless tobacco: Never Used  . Alcohol Use: No    Allergies: No Known Allergies  Meds:  Prescriptions prior to admission  Medication Sig Dispense Refill  . Ferrous Sulfate (CVS SLOW RELEASE IRON) 143 (45 FE) MG TBCR Take 1 tablet by mouth daily.  30 tablet  6  . metroNIDAZOLE (FLAGYL) 500 MG tablet Take 1 tablet (500 mg total) by mouth 2 (two) times daily.  14 tablet  0  . Prenatal Vit-Fe Fumarate-FA (PRENATAL MULTIVITAMIN) TABS Take 1 tablet by mouth daily at 12 noon.        Review of Systems -   Review of Systems  No complaints besides HPI  Physical Exam  Blood pressure 109/67, pulse 80, temperature 98.3 F (36.8 C), temperature source Oral, resp. rate 16, weight 51.71 kg (114 lb), last menstrual period 01/02/2013, unknown if currently breastfeeding. GENERAL: Well-developed,  well-nourished female in no acute distress.   Dilation: 4 Effacement (%): 40 Cervical Position: Middle Station: -3 Presentation: Vertex Exam by:: Dr. Ike Benedom Unchanged over 2hrs and unchanged from discharge 6/30  Presentation: cephalic FHT:  Baseline rate 130s bpm   Variability moderate  Accelerations present   Decelerations none Contractions: Every 2-6 mins   Labs: No results found for this or any previous visit (from the past 24 hour(s)). Imaging Studies:  [place] Ob Comp + 14 Wk  08/12/2013   OBSTETRICAL ULTRASOUND: This exam was performed within a [place] Ultrasound Department. The OB US report was generated in the AS system, and faxed to the ordering physician.   This report is available in the [employer] PACS. See the AS Obstetric US report via the Image Link.   Assessment: [name] is  23 y.o. G3P0201 at 5663w2d presents with Preterm contractions and baseline dialated cervix.  Plan: Unchanged from discharge Will give procardia xl 30mg  x1 here in ED for comfort Percocet 2Tabs in ED for Comfort Discharge home and recommend pelvic rest ( last intercourse 4d ago) F/u in Clinic 09/13/13 or return to MAU for PTL  Ifeoluwa Bartz RYAN 7/3/201512:55 PM

## 2013-09-07 NOTE — Discharge Instructions (Signed)

## 2013-09-07 NOTE — MAU Note (Signed)
Not in the lobby, went for a walk

## 2013-09-09 NOTE — MAU Provider Note (Signed)
Attestation of Attending Supervision of Advanced Practitioner (CNM/NP): Evaluation and management procedures were performed by the Advanced Practitioner under my supervision and collaboration.  I have reviewed the Advanced Practitioner's note and chart, and I agree with the management and plan.  Adler Alton 09/09/2013 8:35 PM

## 2013-09-11 ENCOUNTER — Encounter (HOSPITAL_COMMUNITY): Payer: Self-pay

## 2013-09-11 ENCOUNTER — Inpatient Hospital Stay (HOSPITAL_COMMUNITY)
Admission: AD | Admit: 2013-09-11 | Discharge: 2013-09-13 | DRG: 775 | Disposition: A | Discharge status: Home / Self Care | Payer: Medicaid Other | Source: Ambulatory Visit | Attending: Obstetrics & Gynecology | Admitting: Obstetrics & Gynecology

## 2013-09-11 DIAGNOSIS — O09299 Supervision of pregnancy with other poor reproductive or obstetric history, unspecified trimester: Secondary | ICD-10-CM

## 2013-09-11 DIAGNOSIS — O9902 Anemia complicating childbirth: Principal | ICD-10-CM | POA: Diagnosis present

## 2013-09-11 DIAGNOSIS — O0930 Supervision of pregnancy with insufficient antenatal care, unspecified trimester: Secondary | ICD-10-CM | POA: Diagnosis not present

## 2013-09-11 DIAGNOSIS — D649 Anemia, unspecified: Secondary | ICD-10-CM | POA: Diagnosis present

## 2013-09-11 DIAGNOSIS — O4700 False labor before 37 completed weeks of gestation, unspecified trimester: Secondary | ICD-10-CM | POA: Diagnosis present

## 2013-09-11 LAB — TYPE AND SCREEN
ABO/RH(D): AB POS
Antibody Screen: NEGATIVE

## 2013-09-11 LAB — CBC
HCT: 31.5 % — ABNORMAL LOW (ref 36.0–46.0)
HCT: 31.6 % — ABNORMAL LOW (ref 36.0–46.0)
Hemoglobin: 9.9 g/dL — ABNORMAL LOW (ref 12.0–15.0)
Hemoglobin: 9.9 g/dL — ABNORMAL LOW (ref 12.0–15.0)
MCH: 25.3 pg — AB (ref 26.0–34.0)
MCH: 25.3 pg — ABNORMAL LOW (ref 26.0–34.0)
MCHC: 31.4 g/dL (ref 30.0–36.0)
MCHC: 31.3 g/dL (ref 30.0–36.0)
MCV: 80.4 fL (ref 78.0–100.0)
MCV: 80.6 fL (ref 78.0–100.0)
PLATELETS: 250 10*3/uL (ref 150–400)
PLATELETS: 234 10*3/uL (ref 150–400)
RBC: 3.92 MIL/uL (ref 3.87–5.11)
RBC: 3.92 MIL/uL (ref 3.87–5.11)
RDW: 14.6 % (ref 11.5–15.5)
RDW: 14.6 % (ref 11.5–15.5)
WBC: 12.1 10*3/uL — ABNORMAL HIGH (ref 4.0–10.5)
WBC: 11.2 10*3/uL — AB (ref 4.0–10.5)

## 2013-09-11 LAB — RPR

## 2013-09-11 MED ORDER — OXYTOCIN 40 UNITS IN LACTATED RINGERS INFUSION - SIMPLE MED
62.5000 mL/h | INTRAVENOUS | Status: DC
  Filled 2013-09-11: qty 1000

## 2013-09-11 MED ORDER — OXYTOCIN BOLUS FROM INFUSION
500.0000 mL | INTRAVENOUS | Status: DC
  Administered 2013-09-11: 500 mL via INTRAVENOUS

## 2013-09-11 MED ORDER — ONDANSETRON HCL 4 MG/2ML IJ SOLN: 4.0000 mg | INTRAMUSCULAR | Status: DC | PRN

## 2013-09-11 MED ORDER — PRENATAL MULTIVITAMIN CH
1.0000 | ORAL_TABLET | Freq: Every day | ORAL | Status: DC
  Administered 2013-09-11 – 2013-09-13 (×3): 1 via ORAL
  Filled 2013-09-11 (×3): qty 1

## 2013-09-11 MED ORDER — ONDANSETRON HCL 4 MG PO TABS: 4.0000 mg | ORAL_TABLET | ORAL | Status: DC | PRN

## 2013-09-11 MED ORDER — IBUPROFEN 600 MG PO TABS
600.0000 mg | ORAL_TABLET | Freq: Four times a day (QID) | ORAL | Status: DC | PRN
  Administered 2013-09-11: 600 mg via ORAL
  Filled 2013-09-11: qty 1

## 2013-09-11 MED ORDER — DIPHENHYDRAMINE HCL 25 MG PO CAPS: 25.0000 mg | ORAL_CAPSULE | Freq: Four times a day (QID) | ORAL | Status: DC | PRN

## 2013-09-11 MED ORDER — IBUPROFEN 600 MG PO TABS
600.0000 mg | ORAL_TABLET | Freq: Four times a day (QID) | ORAL | Status: DC
Start: 2013-09-11 — End: 2013-09-13
  Administered 2013-09-11 – 2013-09-13 (×9): 600 mg via ORAL
  Filled 2013-09-11 (×9): qty 1

## 2013-09-11 MED ORDER — SENNOSIDES-DOCUSATE SODIUM 8.6-50 MG PO TABS
2.0000 | ORAL_TABLET | ORAL | Status: DC
  Administered 2013-09-11 – 2013-09-12 (×2): 2 via ORAL
  Filled 2013-09-11 (×2): qty 2

## 2013-09-11 MED ORDER — LANOLIN HYDROUS EX OINT: TOPICAL_OINTMENT | CUTANEOUS | Status: DC | PRN

## 2013-09-11 MED ORDER — LACTATED RINGERS IV SOLN
INTRAVENOUS | Status: DC
  Administered 2013-09-11: 05:00:00 via INTRAVENOUS

## 2013-09-11 MED ORDER — LIDOCAINE HCL (PF) 1 % IJ SOLN
30.0000 mL | INTRAMUSCULAR | Status: DC | PRN
  Filled 2013-09-11: qty 30

## 2013-09-11 MED ORDER — ACETAMINOPHEN 325 MG PO TABS
650.0000 mg | ORAL_TABLET | ORAL | Status: DC | PRN
  Administered 2013-09-12: 650 mg via ORAL
  Filled 2013-09-11: qty 2

## 2013-09-11 MED ORDER — ONDANSETRON HCL 4 MG/2ML IJ SOLN: 4.0000 mg | Freq: Four times a day (QID) | INTRAMUSCULAR | Status: DC | PRN

## 2013-09-11 MED ORDER — BENZOCAINE-MENTHOL 20-0.5 % EX AERO
1.0000 "application " | INHALATION_SPRAY | CUTANEOUS | Status: DC | PRN
  Administered 2013-09-11: 1 via TOPICAL
  Filled 2013-09-11: qty 56

## 2013-09-11 MED ORDER — ZOLPIDEM TARTRATE 5 MG PO TABS: 5.0000 mg | ORAL_TABLET | Freq: Every evening | ORAL | Status: DC | PRN

## 2013-09-11 MED ORDER — SIMETHICONE 80 MG PO CHEW: 80.0000 mg | CHEWABLE_TABLET | ORAL | Status: DC | PRN

## 2013-09-11 MED ORDER — LACTATED RINGERS IV SOLN: 500.0000 mL | INTRAVENOUS | Status: DC | PRN

## 2013-09-11 MED ORDER — OXYCODONE-ACETAMINOPHEN 5-325 MG PO TABS: 1.0000 | ORAL_TABLET | ORAL | Status: DC | PRN

## 2013-09-11 MED ORDER — OXYCODONE-ACETAMINOPHEN 5-325 MG PO TABS
1.0000 | ORAL_TABLET | ORAL | Status: DC | PRN
  Administered 2013-09-11: 2 via ORAL
  Filled 2013-09-11: qty 2

## 2013-09-11 MED ORDER — DIBUCAINE 1 % RE OINT: 1.0000 "application " | TOPICAL_OINTMENT | RECTAL | Status: DC | PRN

## 2013-09-11 MED ORDER — TETANUS-DIPHTH-ACELL PERTUSSIS 5-2.5-18.5 LF-MCG/0.5 IM SUSP: 0.5000 mL | Freq: Once | INTRAMUSCULAR | Status: DC

## 2013-09-11 MED ORDER — WITCH HAZEL-GLYCERIN EX PADS: 1.0000 "application " | MEDICATED_PAD | CUTANEOUS | Status: DC | PRN

## 2013-09-11 MED ORDER — CITRIC ACID-SODIUM CITRATE 334-500 MG/5ML PO SOLN: 30.0000 mL | ORAL | Status: DC | PRN

## 2013-09-11 NOTE — Progress Notes (Signed)
UR completed 

## 2013-09-11 NOTE — H&P (Signed)
[name] is a 23 y.o. female [phone] @[redacted]w[redacted]d  by 30 week U/S pt of [facility] presenting for preterm labor contractions.  Pt was late to care, [facility] her admission to the hospital for PTL on 09/04/13.  Short interval between pregnancies. Hx of demise at 3839w3d. PTL at 33 weeks with last pregnancy delivering in 11/2012. She has received BMZ x2 this pregnancy. She reports good fetal movement, denies LOF, vaginal bleeding, vaginal itching/burning, urinary symptoms, h/a, dizziness, n/v, or fever/chills.    Maternal Medical History:  Reason for admission: Contractions.  Nausea.  Contractions: Onset was 3-5 hours ago.   Frequency: regular.   Duration is approximately 1 minute.   Perceived severity is strong.    Fetal activity: Perceived fetal activity is normal.   Last perceived fetal movement was within the past hour.    Prenatal complications: Preterm labor.   Prenatal Complications - Diabetes: none.    OB History   Grav Para Term Preterm Abortions TAB SAB Ect Mult Living   3 1 0 1 1 0 1 0 0 1      Past Medical History  Diagnosis Date  . No pertinent past medical history   . UTI (lower urinary tract infection)   . Anemia   . Preterm delivery   . Stillbirth    Past Surgical History  Procedure Laterality Date  . No past surgeries     Family History: family history is not on file. Social History:  reports that she has never smoked. She has never used smokeless tobacco. She reports that she does not drink alcohol or use illicit drugs.   Prenatal Transfer Tool  Maternal Diabetes: No Genetic Screening: Normal Maternal Ultrasounds/Referrals: Normal Fetal Ultrasounds or other Referrals:  None Maternal Substance Abuse:  No Significant Maternal Medications:  None Significant Maternal Lab Results:  Lab values include: Group B Strep negative Other Comments:  None  Review of Systems  Constitutional: Negative for fever, chills and malaise/fatigue.  Eyes: Negative for  blurred vision.  Respiratory: Negative for cough and shortness of breath.   Cardiovascular: Negative for chest pain.  Gastrointestinal: Positive for abdominal pain. Negative for heartburn, nausea and vomiting.  Genitourinary: Negative for dysuria, urgency and frequency.  Musculoskeletal: Negative.   Neurological: Negative for dizziness and headaches.  Psychiatric/Behavioral: Negative for depression.    Dilation: 5 Effacement (%): 100 Station: -2 Exam by:: BStanleyRN Blood pressure 106/69, pulse 93, temperature 98.6 F (37 C), temperature source Oral, resp. rate 18, last menstrual period 01/02/2013, SpO2 100.00%, unknown if currently breastfeeding. Maternal Exam:  Uterine Assessment: Contraction strength is moderate.  Contraction duration is 70 seconds. Contraction frequency is regular.   Abdomen: Patient reports no abdominal tenderness. Fetal presentation: vertex  Cervix: Cervix evaluated by digital exam.     Fetal Exam Fetal Monitor Review: Mode: ultrasound.   Baseline rate: 135.  Variability: moderate (6-25 bpm).   Pattern: accelerations present and no decelerations.    Fetal State Assessment: Category I - tracings are normal.     Physical Exam  Prenatal labs: ABO, Rh: --/--/AB POS (06/30 1810) Antibody: NEG (06/30 1810) Rubella: 1.12 (07/21 1200) RPR: NON REAC (06/30 1810)  HBsAg: NEGATIVE (07/21 1200)  HIV: Non-reactive (06/30 0000)  GBS: Negative (06/30 0000)   Assessment/Plan: Preterm labor GBS negative  [facility] Anticipate NSVD    [name] 09/11/2013, 5:57 AM

## 2013-09-11 NOTE — MAU Note (Signed)
Contractions at 4 am and small bright red bleeding at 0415.  Baby moving well. No leaking fluid.  4.5 cm on Friday.

## 2013-09-11 NOTE — Progress Notes (Signed)
Patient ID: Magdalen SpatzQuameca Caddell, female   DOB: 12/27/1990, 23 y.o.   MRN: [id number]  Called to bedside to evaluate lochia.  RN reported increase in lochia, soaking pad within short time after CNM left room after delivery.  Uterus firm, -1, bleeding scant/small after pt emptied bladder.  Small periurethral tear remains hemostatic.  Will continue to evaluate.  Discussed reasons for pt to call for assistance if bleeding too much or symptoms of anemia such as dizziness.  Pt states understanding.  Will continue to evaluate with pad counts.

## 2013-09-11 NOTE — Consult Note (Signed)
Neonatology Note:  Attendance at Delivery:  I was asked by Lisa Leftwich-Kirby, CNM to attend this NSVD at 34 6/[redacted] weeks GA by 30 week ultrasound. The mother is a G3P1A1 AB pos, GBS pending with late/limited PNC, anemia, preterm labor, and precipitous labor. ROM just prior to delivery, fluid clear. Infant vigorous with good spontaneous cry and tone. Needed repeated bulb suctioning for small amounts of clear secretions that he was sneezing/coughing up. We placed a pulse oximeter that showed an O2 saturation of 78% in room air at 3-4 minutes. We gave BBO2 for about 1 minute, then weaned to room air. Ap 8/9. Lungs clear to ausc in DR. Held briefly by his parents in the DR, where I spoke with both of them. The baby was then transported to the NICU in room air with his father in attendance.  Mackenzie Theisen C. Emanuele Mcwhirter, MD  

## 2013-09-11 NOTE — Lactation Note (Signed)
This note was copied from the chart of Boy Magdalen SpatzQuameca Troyer. Lactation Consultation Note   Initial consul with  this  Mom of a NICU baby, now 5413 hours old, and 34 6/[redacted] weeks gestation. Mom has a DEP in her room, has used it once this morning, and hs been using the manual pup to express milk. I had her use the DEP in the premie setting, and advised that she use this every 3 hours, but can skip one tonight. I decreased mom to size 21 flanges, and mom re[orts this feeling comfortable. Mom stopped pumping with her first baby at 3 weeks due to nipple pain. Mom knows how to hand express, and was advised to add this after each pumping. Mom knows to call for questions/concerns.  Patient Name: Boy Magdalen SpatzQuameca Mansfield VHQIO'NToday's Date: 09/11/2013 Reason for consult: Initial assessment;NICU baby;Infant < 6lbs;Late preterm infant   Maternal Data Formula Feeding for Exclusion: Yes Reason for exclusion: Mother's choice to formula and breast feed on admission (baby in NICU) Infant to breast within first hour of birth: No Breastfeeding delayed due to:: Infant status Has patient been taught Hand Expression?: Yes Does the patient have breastfeeding experience prior to this delivery?: Yes  Feeding    LATCH Score/Interventions                      Lactation Tools Discussed/Used Tools: Pump Breast pump type: Double-Electric Breast Pump WIC Program: No ([facility]) Pump Review: Setup, frequency, and cleaning (premie setting, hand exp) Initiated by:: bedside rn Date initiated:: 09/11/13   Consult Status Consult Status: Follow-up Date: 09/12/13 Follow-up type: In-patient    Alfred LevinsLee, Rayvion Stumph Anne 09/11/2013, 7:31 PM

## 2013-09-12 ENCOUNTER — Encounter: Payer: Self-pay | Admitting: Family Medicine

## 2013-09-12 LAB — CBC
HCT: 29.6 % — ABNORMAL LOW (ref 36.0–46.0)
HEMOGLOBIN: 9.3 g/dL — AB (ref 12.0–15.0)
MCH: 25.3 pg — ABNORMAL LOW (ref 26.0–34.0)
MCHC: 31.4 g/dL (ref 30.0–36.0)
MCV: 80.7 fL (ref 78.0–100.0)
Platelets: 238 10*3/uL (ref 150–400)
RBC: 3.67 MIL/uL — ABNORMAL LOW (ref 3.87–5.11)
RDW: 14.7 % (ref 11.5–15.5)
WBC: 11.3 10*3/uL — ABNORMAL HIGH (ref 4.0–10.5)

## 2013-09-13 ENCOUNTER — Encounter: Payer: Medicaid Other | Admitting: Obstetrics & Gynecology

## 2013-09-13 MED ORDER — IBUPROFEN 600 MG PO TABS: 600.0000 mg | ORAL_TABLET | Freq: Four times a day (QID) | ORAL | Status: DC | PRN

## 2013-09-13 NOTE — Progress Notes (Signed)
Pt.  can NOT get the $30.00 to rent the breast pump,  [facility] and completed the paper work for the rental of one of our pumps.  She will continue to hand pump and do hand expression until she gets her pump.  Lactation RN reviewed paper work and breast care with her.  Cristie HemMoira Kashmere Staffa

## 2013-09-13 NOTE — Discharge Summary (Signed)
Obstetric Discharge Summary Reason for Admission: PTL labor Prenatal Procedures: Intrapartum Procedures: spontaneous vaginal delivery Postpartum Procedures: none Complications-Operative and Postpartum: none  Hospital Course: Pt was admitted in preterm labor and progressed to deliver vaginally. PP patient without issues. Pt is pumping breast milk for infant. Pt desires mirena for contraception. Pt bleeding improving, pt ambulating and meeting PP milestones.   Delivery Note At 6:05 AM a viable and healthy female was delivered via Vaginal, Spontaneous Delivery (Presentation: Left Occiput Anterior).  APGAR: 8, 9; weight pending .   Placenta status: Intact, Spontaneous Pathology.  Cord: 3 vessels with the following complications: Marginal cord insertion.    Anesthesia: None  Episiotomy: None Lacerations: Periurethral, hemostatic, not repaired Suture Repair: n/a Est. Blood Loss (mL): 300  Mom to postpartum.  Baby to NICU.  [id number] [facility] with [facility]x of 33 week delivery and 19 week PPROM previable delivery.  She was admitted during this pregnancy on 09/04/13 and had BMZ x 2 during her admission.  She was admitted 09/11/13 [facility] MAU [facility] at 5 cm, had SROM and very quickly progressed to 10 cm. Pt was unable to obtain desired epidural and pushed x1 contractions to deliver viable female infant.  Infant vigorous and placed in mother's arms with cord clamping delayed by 2-3 minutes.  Cord clamped by CNM and cut by FOB.  Infant taken to warmer were NICU team waiting to evaluated.  Infant taken to NICU r/t gestational age in stable condition.  Placenta delivered spontaneously and was intact but had very marginal, almost velamentous cord insertion.  Small periurethral tear noted which was hemostatic after some slight pressure applied so not repaired.  Pt stable, infant transported to NICU.   LEFTWICH-KIRBY, LISA 09/11/2013, 6:26 AM  H/H: Lab Results  Component Value  Date/Time   HGB 9.3* 09/12/2013  5:23 AM   HCT 29.6* 09/12/2013  5:23 AM    Filed Vitals:   09/13/13 0518  BP: 104/66  Pulse: 79  Temp: 97.9 F (36.6 C)  Resp: 16    Physical Exam: VSS NAD  Abd: Appropriately tender, ND, Fundus @U -2 Incision: NA No c/c/e, Neg homan's sign, neg cords Lochia Appropriate  Discharge Diagnoses: Premature labor  Discharge Information: Activity: pelvic rest Diet: routine  Medications: PNV and Ibuprofen Breast feeding:  Yes Condition: stable Instructions: refer to handout Discharge to: home      Medication List         Ferrous Sulfate 143 (45 FE) MG Tbcr  Commonly known as:  [employer] SLOW RELEASE IRON  Take 1 tablet by mouth daily.     ibuprofen 600 MG tablet  Commonly known as:  ADVIL,MOTRIN  Take 1 tablet (600 mg total) by mouth every 6 (six) hours as needed (pain scale < 4).     prenatal multivitamin Tabs tablet  Take 1 tablet by mouth daily at 12 noon.       Follow-up Information   [facility]. (For Postpartum Visit and placement of EXCELLENT BIRTH CONTROL with your insurance. )    Specialty:  Obstetrics and Gynecology   Contact information:   [address] [place] [address] [phone]      Tawana ScaleODOM, Kahiau Schewe RYAN 09/13/2013,9:35 AM

## 2013-09-13 NOTE — Discharge Instructions (Signed)

## 2013-09-13 NOTE — Progress Notes (Signed)
Post Partum Day #1 Subjective: no complaints, up ad lib and tolerating PO; infant stable in NICU; plans bottlefeeding and Depo for contraception  Objective: Blood pressure 104/66, pulse 79, temperature 97.9 F (36.6 C), temperature source Oral, resp. rate 16, height 5\' 3"  (1.6 m), weight 52.164 kg (115 lb), last menstrual period 01/02/2013, SpO2 100.00%, unknown if currently breastfeeding.  Physical Exam:  General: alert, cooperative and no distress Lungs: nl effort Heart: RRR Lochia: appropriate Uterine Fundus: firm DVT Evaluation: No evidence of DVT seen on physical exam.   Recent Labs  09/11/13 0705 09/12/13 0523  HGB 9.9* 9.3*  HCT 31.6* 29.6*    Assessment/Plan: Plan for discharge tomorrow   LOS: 2 days   Cam HaiSHAW, Welma Mccombs CNM 09/13/2013, 8:57 AM

## 2013-09-13 NOTE — Lactation Note (Signed)
This note was copied from the chart of [name]. Lactation Consultation Note  Patient Name: [name] BWLSL'H Date: 09/13/2013   [facility] saw mom this morning and mom reported pumping 5-10 ml and reports pumping every 3 hrs "6-9 times" a day.  At last pumping this afternoon, mom pumped 35 ml transitional milk.  Mom is not active with WIC.  [place] Referral had been sent and Nedra from [facility] called [place].  LC encouraged mom to call [place] to set up appointment.  LC told mom she would need a [facility] appointment before [facility] could do a [facility] Loaner Pump.  Mom called WIC and set up appointment for Wednesday, July 15th @ 10:00 am (next week), but dad just told LC they did not have the money at this time to do a Materials engineer.  Dad said he could get the money but not today.  Encouraged dad to keep paperwork and gave dad the option of returning tomorrow to lactation store to do a [facility] Loaner.  Mom states she knows how to use hand pump from kit and states she also has a DEBP at home from first child but it not sure of brand.  Encouraged mom to use hands-on pumping with pumping sessions and to monitor milk output with her pump, keep a pumping log.  Encouraged mom to call lactation for questions after discharge and informed of continue lactation support in NICU.     Merlene Laughter 09/13/2013, 3:55 PM

## 2013-09-17 NOTE — H&P (Signed)
Attestation of Attending Supervision of Advanced Practitioner (CNM/NP): Evaluation and management procedures were performed by the Advanced Practitioner under my supervision and collaboration.  I have reviewed the Advanced Practitioner's note and chart, and I agree with the management and plan.  HARRAWAY-SMITH, Laken Rog 11:26 AM     

## 2013-10-18 ENCOUNTER — Ambulatory Visit (INDEPENDENT_AMBULATORY_CARE_PROVIDER_SITE_OTHER): Payer: Medicaid Other | Admitting: Family Medicine

## 2013-10-18 ENCOUNTER — Encounter: Payer: Self-pay | Admitting: Family Medicine

## 2013-10-18 VITALS — BP 120/68 | HR 72 | Temp 97.8°F | Ht 63.0 in | Wt 102.8 lb

## 2013-10-18 DIAGNOSIS — Z30013 Encounter for initial prescription of injectable contraceptive: Secondary | ICD-10-CM

## 2013-10-18 DIAGNOSIS — Z3009 Encounter for other general counseling and advice on contraception: Secondary | ICD-10-CM

## 2013-10-18 DIAGNOSIS — R87620 Atypical squamous cells of undetermined significance on cytologic smear of vagina (ASC-US): Secondary | ICD-10-CM

## 2013-10-18 DIAGNOSIS — Z3049 Encounter for surveillance of other contraceptives: Secondary | ICD-10-CM

## 2013-10-18 LAB — POCT PREGNANCY, URINE: PREG TEST UR: NEGATIVE

## 2013-10-18 MED ORDER — MEDROXYPROGESTERONE ACETATE 104 MG/0.65ML ~~LOC~~ SUSP
104.0000 mg | Freq: Once | SUBCUTANEOUS | Status: AC
  Administered 2013-10-18: 104 mg via SUBCUTANEOUS

## 2013-10-18 MED ORDER — MEDROXYPROGESTERONE ACETATE 104 MG/0.65ML ~~LOC~~ SUSP: 104.0000 mg | SUBCUTANEOUS | Status: DC

## 2013-10-18 MED ORDER — MEDROXYPROGESTERONE ACETATE 150 MG/ML IM SUSP: 150.0000 mg | Freq: Once | INTRAMUSCULAR | Status: DC

## 2013-10-18 NOTE — Progress Notes (Signed)
Subjective:    Mackenzie SpatzQuameca [name] is a 23 y.o. [id number] African American female who presents for a postpartum visit. She is 6 weeks postpartum following a spontaneous vaginal delivery at 978w6d. I have fully reviewed the prenatal and intrapartum course. The delivery was at 758w6d gestational weeks. Outcome: spontaneous vaginal delivery. Anesthesia: none. Postpartum course has been normal. Baby spent 6 days in NICU, since has been discharged and doing well at home. Baby is feeding by both breast and bottle - Similac Advance. Bleeding no bleeding. Bowel function is normal. Bladder function is normal. Patient is not sexually active. Contraception method is Depo-Provera injections. Postpartum depression screening: negative.  The following portions of the patient's history were reviewed and updated as appropriate: allergies, current medications, past medical history, past surgical history and problem list.  Review of Systems Pertinent items are noted in HPI.   Filed Vitals:   10/18/13 1546  BP: 120/68  Pulse: 72  Temp: 97.8 F (36.6 C)  Height: 5\' 3"  (1.6 m)  Weight: 102 lb 12.8 oz (46.63 kg)    Objective:     General:  alert, cooperative and no distress   Breasts:  deferred, no complaints  Lungs: Normal effort, no respiratory distress  Heart:  regular rate  Abdomen: soft, nontender       Assessment:   normal postpartum exam 5 wks s/p preterm SVD Depression screening Contraception counseling   Plan:   Contraception: Depo-Provera injections, 1st shot here and remainder will [facility].  Declines nexplanon or mirena/LARC. ASC-US: needs repeat pap in 1 year Follow up in: 1 year for repeat pap or as needed.

## 2013-10-18 NOTE — Addendum Note (Signed)
Addended by: Candelaria StagersHAIZLIP, Quaneshia Wareing E on: 10/18/2013 04:23 PM   Modules accepted: Orders

## 2013-12-24 ENCOUNTER — Encounter: Payer: Self-pay | Admitting: Obstetrics & Gynecology

## 2014-01-07 ENCOUNTER — Encounter: Payer: Self-pay | Admitting: Family Medicine

## 2014-02-13 ENCOUNTER — Telehealth: Payer: Self-pay

## 2014-02-13 ENCOUNTER — Ambulatory Visit: Payer: Medicaid Other | Admitting: Family Medicine

## 2014-02-13 NOTE — Telephone Encounter (Signed)
Patient missed appointment today. Attempted to contact patient. Mobile number invalid. Home phone-- no answer. Left message stating we are sorry you missed your appointment, if you would like to reschedule please call clinic.

## 2014-03-08 NOTE — L&D Delivery Note (Cosign Needed)
Patient is 24 y.o. [id number] 5434w2d admitted in active labor, hx of PTL and on 17-P this pregnancy. Patient was admitted in active labor and she progressed naturally on her own until she was complete and ready to push.    Delivery Note At 7:02 AM a viable and healthy female was delivered via Vaginal, Spontaneous Delivery (Presentation: Direct OA).  APGAR: 8, 9; weight 5 lb 6.6 oz (2455 g).   Placenta status: Intact, Spontaneous.  Cord: 3 vessels with the following complications: None.  Cord pH: pending  Anesthesia: None  Episiotomy: None Lacerations: None Suture Repair: N/A Est. Blood Loss (mL): 100  Mom to postpartum.  Baby to Couplet care / Skin to Skin.  [name] 02/26/2015, 8:10 AM    OB fellow attestation: Patient is a [phone] at 334w2d who was admitted in active PTL I was gloved and present for delivery in its entirety. Federico FlakeKimberly Niles Matson Welch, MD 8:55 AM

## 2014-06-30 ENCOUNTER — Telehealth: Payer: Self-pay | Admitting: Family

## 2014-06-30 ENCOUNTER — Other Ambulatory Visit: Payer: Self-pay | Admitting: Family Medicine

## 2014-06-30 DIAGNOSIS — R5381 Other malaise: Secondary | ICD-10-CM

## 2014-06-30 DIAGNOSIS — R05 Cough: Secondary | ICD-10-CM

## 2014-06-30 DIAGNOSIS — N898 Other specified noninflammatory disorders of vagina: Secondary | ICD-10-CM

## 2014-06-30 DIAGNOSIS — R059 Cough, unspecified: Secondary | ICD-10-CM

## 2014-06-30 DIAGNOSIS — R197 Diarrhea, unspecified: Secondary | ICD-10-CM

## 2014-06-30 DIAGNOSIS — M545 Low back pain: Secondary | ICD-10-CM

## 2014-06-30 NOTE — Progress Notes (Signed)
Based on what you shared with me it looks like you have a serious condition that should be evaluated in a face to face office visit.  The primary reason for face-to-face is the limited ability to choose the right medications for you if you are pregnant and also that we cannot rule out the possible pregnancy you mentioned. We may also need to do further testing as there can be many complications that coincide with pregnancy that could contribute to your current problem.  If you are having a true medical emergency please call 911.  If you need an urgent face to face visit, [facility] has four urgent care centers for your convenience.  Tressie Ellis. [facility]  [phone] Get Driving Directions Find a Provider at this Location  [address] [place], [place] [address] . 8 am to 8 pm Monday-Friday . 9 am to 7 pm Saturday-Sunday  . [facility] [place] [facility]  [phone] Get Driving Directions Find a Provider at this Location  [address], [address] [place], [place] [address] . 8 am to 8 pm Monday-Friday . 9 am to 6 pm Saturday . 11 am to 6 pm Sunday   . [facility] [place]  [phone] Get Driving Directions  [address] [place], [place] [address] . 8 am to 8 pm Monday-Friday . 9 am to 4 pm Saturday-Sunday   . [facility] (a walk in primary care provider)  [phone]  Get Driving Directions Find a Provider at this Location  [address] [place], [place] [address] . 8 am to 8:30 pm Monday-Thursday . 8 am to 6 pm Friday . 8 am to 4 pm Saturday-Sunday   Your e-visit answers were reviewed by a board certified advanced clinical practitioner to complete your personal care plan.  Depending on the condition, your plan could have included both over the counter or prescription medications.  You will get an e-mail in the next two days asking about your experience.  I hope that your e-visit  has been valuable and will speed your recovery . Thank you for choosing an e-visit.

## 2014-06-30 NOTE — Progress Notes (Signed)
Based on what you shared with me it looks like you have a serious condition that should be evaluated in a face to face office visit.  You have submitted many e-visits that are concerning regarding your possible pregnancy. The back pain, the consistent fever, the vaginal discharge, and the loose stools are very concerning that you may have some type of infection that could have spread. We simply cannot evaluate your complex condition via the e-visit program.  Please, for safety reasons, go to the [facility] location[facility]) listed below so that we can help you. Please do so today as I am very concerned about your fever lasting more than 1 week in combination with the discharge. If you are indeed pregnant, this could put your baby in danger also.   If you are having a true medical emergency please call 911.  If you need an urgent face to face visit, [facility] has four urgent care centers for your convenience.  Tressie Ellis.  Urgent Care Center  [phone] Get Driving Directions Find a Provider at this Location  [address] [place], [place] [address] . 8 am to 8 pm Monday-Friday . 9 am to 7 pm Saturday-Sunday  . [facility] at [facility] [place]  [phone] Get Driving Directions Find a Provider at this Location  [address], [address] [place], [place] [address] . 8 am to 8 pm Monday-Friday . 9 am to 6 pm Saturday . 11 am to 6 pm Sunday   . [facility] [place]  [phone] Get Driving Directions  [address] [place], [place] [address] . 8 am to 8 pm Monday-Friday . 9 am to 4 pm Saturday-Sunday   . [facility] (a walk in primary care provider)  [phone]  Get Driving Directions Find a Provider at this Location  [address] [place], [place] [address] . 8 am to 8:30 pm Monday-Thursday . 8 am to 6 pm Friday . 8 am to 4 pm Saturday-Sunday   Your e-visit answers were reviewed by a board  certified advanced clinical practitioner to complete your personal care plan.  Depending on the condition, your plan could have included both over the counter or prescription medications.  You will get an e-mail in the next two days asking about your experience.  I hope that your e-visit has been valuable and will speed your recovery . Thank you for choosing an e-visit.

## 2014-06-30 NOTE — Progress Notes (Unsigned)
Based on what you shared with me it looks like you have a serious condition that should be evaluated in a face to face office visit.  You have many conditions which are very concerning and pointing toward a septic/systemic infection. Please, immediately go to Urgent Care listed below OR the Emergency Department. Do not delay as your child's health is also at risk as you indicated you are pregnant.   If you are having a true medical emergency please call 911.  If you need an urgent face to face visit, [place] has four urgent care centers for your convenience.  Tressie Ellis.  Urgent Care Center  [phone] Get Driving Directions Find a Provider at this Location  [address] [place], [place] [address] . 8 am to 8 pm Monday-Friday . 9 am to 7 pm Saturday-Sunday  . [facility]  [phone] Get Driving Directions Find a Provider at this Location  [address] [place], [place] [address] . 8 am to 8 pm Monday-Friday . 9 am to 6 pm Saturday . 11 am to 6 pm Sunday   . [facility] at [facility] [place]  [phone] Get Driving Directions  [address] [place], [place] [address] . 8 am to 8 pm Monday-Friday . 9 am to 4 pm Saturday-Sunday   . [facility] (a walk in primary care provider)  [phone]  Get Driving Directions Find a Provider at this Location  [address] [place], [place] [address] . 8 am to 8:30 pm Monday-Thursday . 8 am to 6 pm Friday . 8 am to 4 pm Saturday-Sunday   Your e-visit answers were reviewed by a board certified advanced clinical practitioner to complete your personal care plan.  Depending on the condition, your plan could have included both over the counter or prescription medications.  You will get an e-mail in the next two days asking about your experience.  I hope that your e-visit has been valuable and will speed your recovery . Thank you for choosing  an e-visit.

## 2014-06-30 NOTE — Progress Notes (Signed)
Based on what you shared with me it looks like you have a serious condition that should be evaluated in a face to face office visit.  You have many conditions which are very concerning and pointing toward a septic/systemic infection. Please, immediately go to Urgent Care listed below OR the Emergency Department. Do not delay as your child's health is also at risk as you indicated you are pregnant.   If you are having a true medical emergency please call 911.  If you need an urgent face to face visit, [place] has four urgent care centers for your convenience.  . [facility]  [phone] Get Driving Directions Find a Provider at this Location  [address] [place], [place] [address] . 8 am to 8 pm Monday-Friday . 9 am to 7 pm Saturday-Sunday  . [facility]  [phone] Get Driving Directions Find a Provider at this Location  [address], [address] [place], [place] [address] . 8 am to 8 pm Monday-Friday . 9 am to 6 pm Saturday . 11 am to 6 pm Sunday   .  [facility] at [facility] [place]  [phone] Get Driving Directions  [address] [place], [place] [address] . 8 am to 8 pm Monday-Friday . 9 am to 4 pm Saturday-Sunday   . [facility] (a walk in primary care provider)  [phone]  Get Driving Directions Find a Provider at this Location  [address] [place], [place] [address] . 8 am to 8:30 pm Monday-Thursday . 8 am to 6 pm Friday . 8 am to 4 pm Saturday-Sunday   Your e-visit answers were reviewed by a board certified advanced clinical practitioner to complete your personal care plan.  Depending on the condition, your plan could have included both over the counter or prescription medications.  You will get an e-mail in the next two days asking about your experience.  I hope that your e-visit has been valuable and will speed your recovery . Thank you for choosing  an e-visit.      

## 2014-07-01 ENCOUNTER — Encounter (HOSPITAL_COMMUNITY): Payer: Self-pay | Admitting: *Deleted

## 2014-07-01 ENCOUNTER — Inpatient Hospital Stay (HOSPITAL_COMMUNITY)
Admission: AD | Admit: 2014-07-01 | Discharge: 2014-07-01 | Disposition: A | Discharge status: Home / Self Care | Payer: Medicaid Other | Source: Ambulatory Visit | Attending: Obstetrics & Gynecology | Admitting: Obstetrics & Gynecology

## 2014-07-01 DIAGNOSIS — K529 Noninfective gastroenteritis and colitis, unspecified: Secondary | ICD-10-CM | POA: Insufficient documentation

## 2014-07-01 DIAGNOSIS — R197 Diarrhea, unspecified: Secondary | ICD-10-CM | POA: Insufficient documentation

## 2014-07-01 DIAGNOSIS — R112 Nausea with vomiting, unspecified: Secondary | ICD-10-CM | POA: Diagnosis present

## 2014-07-01 LAB — CBC WITH DIFFERENTIAL/PLATELET
Basophils Absolute: 0 10*3/uL (ref 0.0–0.1)
Basophils Relative: 1 % (ref 0–1)
EOS PCT: 1 % (ref 0–5)
Eosinophils Absolute: 0.1 10*3/uL (ref 0.0–0.7)
HCT: 35.9 % — ABNORMAL LOW (ref 36.0–46.0)
HEMOGLOBIN: 11.6 g/dL — AB (ref 12.0–15.0)
Lymphocytes Relative: 40 % (ref 12–46)
Lymphs Abs: 1.5 10*3/uL (ref 0.7–4.0)
MCH: 27.2 pg (ref 26.0–34.0)
MCHC: 32.3 g/dL (ref 30.0–36.0)
MCV: 84.1 fL (ref 78.0–100.0)
MONOS PCT: 10 % (ref 3–12)
Monocytes Absolute: 0.3 10*3/uL (ref 0.1–1.0)
NEUTROS ABS: 1.7 10*3/uL (ref 1.7–7.7)
NEUTROS PCT: 48 % (ref 43–77)
PLATELETS: 218 10*3/uL (ref 150–400)
RBC: 4.27 MIL/uL (ref 3.87–5.11)
RDW: 13.9 % (ref 11.5–15.5)
WBC: 3.6 10*3/uL — ABNORMAL LOW (ref 4.0–10.5)

## 2014-07-01 LAB — URINE MICROSCOPIC-ADD ON

## 2014-07-01 LAB — URINALYSIS, ROUTINE W REFLEX MICROSCOPIC
Bilirubin Urine: NEGATIVE
GLUCOSE, UA: NEGATIVE mg/dL
Ketones, ur: NEGATIVE mg/dL
Nitrite: NEGATIVE
Protein, ur: NEGATIVE mg/dL
SPECIFIC GRAVITY, URINE: 1.025 (ref 1.005–1.030)
UROBILINOGEN UA: 1 mg/dL (ref 0.0–1.0)
pH: 6 (ref 5.0–8.0)

## 2014-07-01 LAB — POCT PREGNANCY, URINE: Preg Test, Ur: NEGATIVE

## 2014-07-01 MED ORDER — ONDANSETRON HCL 4 MG PO TABS
8.0000 mg | ORAL_TABLET | ORAL | Status: AC
  Administered 2014-07-01: 8 mg via ORAL
  Filled 2014-07-01: qty 2

## 2014-07-01 MED ORDER — ONDANSETRON 8 MG PO TBDP: 8.0000 mg | ORAL_TABLET | Freq: Three times a day (TID) | ORAL | Status: DC | PRN

## 2014-07-01 NOTE — MAU Note (Addendum)
Last 3 days has been having back pain. Keeping down fluids, but throws up if she eats something solid.also had diarrhea the last 3 days (loose not watery). Had fever 101.8 yesterday.  Denies urinary symptoms. Was having pain below belly button, like with movement or when she bent over, not currently having it.

## 2014-07-01 NOTE — Discharge Instructions (Signed)

## 2014-07-01 NOTE — MAU Provider Note (Signed)
  History   24 yo female in with c/o nausea, vomiting and diarrhea for past 2-3 days. C/o pain at umbulicus that is intermittant and pain in both flanks. States ran a fever the first night.  CSN: [id number]  Arrival date and time: 07/01/14 1646   None     Chief Complaint  Patient presents with  . Abdominal Pain  . Back Pain  . Emesis   HPI  OB History    Gravida Para Term Preterm AB TAB SAB Ectopic Multiple Living   3 2 0 2 1 0 1 0 0 2       Past Medical History  Diagnosis Date  . No pertinent past medical history   . UTI (lower urinary tract infection)   . Anemia   . Preterm delivery   . Stillbirth     Past Surgical History  Procedure Laterality Date  . No past surgeries      History reviewed. No pertinent family history.  History  Substance Use Topics  . Smoking status: Never Smoker   . Smokeless tobacco: Never Used  . Alcohol Use: No    Allergies: No Known Allergies  Prescriptions prior to admission  Medication Sig Dispense Refill Last Dose  . Ferrous Sulfate ([employer] SLOW RELEASE IRON) 143 (45 FE) MG TBCR Take 1 tablet by mouth daily. 30 tablet 6 Taking  . ibuprofen (ADVIL,MOTRIN) 600 MG tablet Take 1 tablet (600 mg total) by mouth every 6 (six) hours as needed (pain scale < 4). 30 tablet 0 Not Taking  . Prenatal Vit-Fe Fumarate-FA (PRENATAL MULTIVITAMIN) TABS Take 1 tablet by mouth daily at 12 noon.   Taking    Review of Systems  Constitutional: Positive for fever.  HENT: Negative.   Eyes: Negative.   Respiratory: Negative.   Cardiovascular: Negative.   Gastrointestinal: Positive for nausea, vomiting, abdominal pain and diarrhea.  Genitourinary: Positive for flank pain.  Musculoskeletal: Negative.   Skin: Negative.   Neurological: Negative.   Endo/Heme/Allergies: Negative.   Psychiatric/Behavioral: Negative.    Physical Exam   Blood pressure 110/58, pulse 82, temperature 98.5 F (36.9 C), temperature source Oral, resp. rate 18, height 5\' 2"   (1.575 m), weight 96 lb (43.545 kg), currently breastfeeding.  Physical Exam  Constitutional: She is oriented to person, place, and time. She appears well-developed and well-nourished.  HENT:  Head: Normocephalic.  Eyes: Pupils are equal, round, and reactive to light.  Neck: Normal range of motion.  Cardiovascular: Normal rate, regular rhythm, normal heart sounds and intact distal pulses.   Respiratory: Effort normal and breath sounds normal.  GI: Soft. Bowel sounds are normal.  Genitourinary: Vagina normal.  Musculoskeletal: Normal range of motion.  Neurological: She is alert and oriented to person, place, and time. She has normal reflexes.  Skin: Skin is warm and dry.  Psychiatric: She has a normal mood and affect. Her behavior is normal. Judgment and thought content normal.    MAU Course  Procedures  MDM gasteroenteritis  Assessment and Plan  Cbc with dif, u/a, UPT neg, zofran 8 mg ODT, feeling betterb will d/c home.  Wyvonnia DuskyLAWSON, Lynee Rosenbach DARLENE 07/01/2014, 5:11 PM

## 2014-07-12 ENCOUNTER — Encounter: Payer: Self-pay | Admitting: Obstetrics and Gynecology

## 2014-07-12 ENCOUNTER — Encounter: Payer: Self-pay | Admitting: Obstetrics & Gynecology

## 2014-07-15 ENCOUNTER — Telehealth: Payer: Self-pay

## 2014-07-15 NOTE — Telephone Encounter (Signed)
Patient sent mychart message stating she was supposed to have follow up for low CBC (was seen two weeks ago in MAU for gastroenteritis during which time WBC was 3.6). Discussed with Dr. Erin FullingHarraway-Smith who states patient should come in for CBC draw. Called patient and informed her of this-- patient states she can come in at 1400 on 07/17/14. Informed patient this was OK. Denies any further questions or concerns. Message sent to admin pool to add to lab schedule.

## 2014-07-16 ENCOUNTER — Encounter: Payer: Self-pay | Admitting: Obstetrics & Gynecology

## 2014-07-17 ENCOUNTER — Other Ambulatory Visit: Payer: Medicaid Other

## 2014-08-05 ENCOUNTER — Inpatient Hospital Stay (HOSPITAL_COMMUNITY)
Admission: AD | Admit: 2014-08-05 | Discharge: 2014-08-05 | Disposition: A | Discharge status: Home / Self Care | Payer: Medicaid Other | Source: Ambulatory Visit | Attending: Obstetrics and Gynecology | Admitting: Obstetrics and Gynecology

## 2014-08-05 NOTE — MAU Note (Signed)
Pt instructed to go to clinic tomorrow between 8 & 11 for lab draw by J. Rasch NP.  Pt verbalizes understanding.

## 2014-08-05 NOTE — MAU Note (Signed)
Pt was scheduled to have blood drawn on 5/11 but did not come.  Pt states she talked to someone from the clinic on the phone a couple of weeks ago & was told to come in whenever.  Pt denies any pain or other sx's today.

## 2014-08-06 ENCOUNTER — Other Ambulatory Visit: Payer: Medicaid Other

## 2014-08-06 ENCOUNTER — Encounter: Payer: Self-pay | Admitting: Obstetrics & Gynecology

## 2014-08-06 DIAGNOSIS — D72819 Decreased white blood cell count, unspecified: Secondary | ICD-10-CM

## 2014-08-06 LAB — CBC
HEMATOCRIT: 35.8 % — AB (ref 36.0–46.0)
Hemoglobin: 11.7 g/dL — ABNORMAL LOW (ref 12.0–15.0)
MCH: 27.1 pg (ref 26.0–34.0)
MCHC: 32.7 g/dL (ref 30.0–36.0)
MCV: 82.9 fL (ref 78.0–100.0)
MPV: 9.2 fL (ref 8.6–12.4)
Platelets: 294 10*3/uL (ref 150–400)
RBC: 4.32 MIL/uL (ref 3.87–5.11)
RDW: 14 % (ref 11.5–15.5)
WBC: 6.4 10*3/uL (ref 4.0–10.5)

## 2014-08-14 ENCOUNTER — Encounter: Payer: Self-pay | Admitting: Obstetrics & Gynecology

## 2014-08-14 ENCOUNTER — Ambulatory Visit: Payer: Medicaid Other | Admitting: Obstetrics & Gynecology

## 2014-09-04 ENCOUNTER — Inpatient Hospital Stay (HOSPITAL_COMMUNITY)
Admission: AD | Admit: 2014-09-04 | Discharge: 2014-09-04 | Disposition: A | Discharge status: Home / Self Care | Payer: Medicaid Other | Source: Ambulatory Visit | Attending: Obstetrics & Gynecology | Admitting: Obstetrics & Gynecology

## 2014-09-04 ENCOUNTER — Encounter (HOSPITAL_COMMUNITY): Payer: Self-pay | Admitting: *Deleted

## 2014-09-04 DIAGNOSIS — O219 Vomiting of pregnancy, unspecified: Secondary | ICD-10-CM

## 2014-09-04 DIAGNOSIS — O9989 Other specified diseases and conditions complicating pregnancy, childbirth and the puerperium: Secondary | ICD-10-CM | POA: Diagnosis not present

## 2014-09-04 DIAGNOSIS — O210 Mild hyperemesis gravidarum: Secondary | ICD-10-CM | POA: Insufficient documentation

## 2014-09-04 DIAGNOSIS — Z3A09 9 weeks gestation of pregnancy: Secondary | ICD-10-CM | POA: Insufficient documentation

## 2014-09-04 DIAGNOSIS — R109 Unspecified abdominal pain: Secondary | ICD-10-CM | POA: Diagnosis not present

## 2014-09-04 DIAGNOSIS — O26899 Other specified pregnancy related conditions, unspecified trimester: Secondary | ICD-10-CM

## 2014-09-04 LAB — URINALYSIS, ROUTINE W REFLEX MICROSCOPIC
Bilirubin Urine: NEGATIVE
GLUCOSE, UA: NEGATIVE mg/dL
HGB URINE DIPSTICK: NEGATIVE
KETONES UR: NEGATIVE mg/dL
Leukocytes, UA: NEGATIVE
NITRITE: NEGATIVE
Protein, ur: NEGATIVE mg/dL
Specific Gravity, Urine: 1.01 (ref 1.005–1.030)
Urobilinogen, UA: 0.2 mg/dL (ref 0.0–1.0)
pH: 7 (ref 5.0–8.0)

## 2014-09-04 LAB — OB RESULTS CONSOLE GC/CHLAMYDIA: Gonorrhea: NEGATIVE

## 2014-09-04 LAB — WET PREP, GENITAL
TRICH WET PREP: NONE SEEN
YEAST WET PREP: NONE SEEN

## 2014-09-04 LAB — OB RESULTS CONSOLE HIV ANTIBODY (ROUTINE TESTING): HIV: NONREACTIVE

## 2014-09-04 LAB — POCT PREGNANCY, URINE: PREG TEST UR: POSITIVE — AB

## 2014-09-04 MED ORDER — ACETAMINOPHEN 500 MG PO TABS
1000.0000 mg | ORAL_TABLET | Freq: Once | ORAL | Status: AC
  Administered 2014-09-04: 1000 mg via ORAL
  Filled 2014-09-04: qty 2

## 2014-09-04 MED ORDER — PROMETHAZINE HCL 12.5 MG PO TABS: 12.5000 mg | ORAL_TABLET | Freq: Four times a day (QID) | ORAL | Status: DC | PRN

## 2014-09-04 MED ORDER — PROMETHAZINE HCL 25 MG PO TABS
25.0000 mg | ORAL_TABLET | Freq: Once | ORAL | Status: AC
  Administered 2014-09-04: 25 mg via ORAL
  Filled 2014-09-04: qty 1

## 2014-09-04 NOTE — MAU Provider Note (Signed)
History     CSN: [id number]  Arrival date and time: 09/04/14 1117   First Provider Initiated Contact with Patient 09/04/14 1229      Chief Complaint  Patient presents with  . Abdominal Pain  . Back Pain  . Nausea   HPI   [name] is a 24 y.o. female [phone] at [id number] presenting to MAU with abdominal pain and back pain.  She has history of 2 preterm delivery's and one 19 week delivery (PROM); record says possible incompetent cervix. She is very nervous about this pregnancy with her history.   Her abdominal pain started this morning; the pain is located at her belly button and just below it. The pain is constant. Denies vaginal bleeding. She has tried tylenol this morning which did not relieve the pain; she only took one pill. She currently rates her pain 8/10.   The back pain started this morning; the pain is located in both sides of her lower back. The pain is constant; tylenol did not help.   She plans to get her care in the [facility]; she would like a referral today.   OB History    Gravida Para Term Preterm AB TAB SAB Ectopic Multiple Living   4 2 0 2 1 0 1 0 0 2       Past Medical History  Diagnosis Date  . No pertinent past medical history   . UTI (lower urinary tract infection)   . Anemia   . Preterm delivery   . Stillbirth     Past Surgical History  Procedure Laterality Date  . No past surgeries      No family history on file.  History  Substance Use Topics  . Smoking status: Never Smoker   . Smokeless tobacco: Never Used  . Alcohol Use: No    Allergies: No Known Allergies  Prescriptions prior to admission  Medication Sig Dispense Refill Last Dose  . acetaminophen (TYLENOL) 325 MG tablet Take 650 mg by mouth every 6 (six) hours as needed.   09/04/2014 at Unknown time  . Ferrous Sulfate ([employer] SLOW RELEASE IRON) 143 (45 FE) MG TBCR Take 1 tablet by mouth daily. (Patient not taking: Reported on 09/04/2014) 30 tablet 6 07/01/2014 at Unknown time  .  ibuprofen (ADVIL,MOTRIN) 600 MG tablet Take 1 tablet (600 mg total) by mouth every 6 (six) hours as needed (pain scale < 4). (Patient not taking: Reported on 07/01/2014) 30 tablet 0 more than one month  . ondansetron (ZOFRAN ODT) 8 MG disintegrating tablet Take 1 tablet (8 mg total) by mouth every 8 (eight) hours as needed for nausea or vomiting. (Patient not taking: Reported on 09/04/2014) 20 tablet 0    Results for orders placed or performed during the hospital encounter of 09/04/14 (from the past 48 hour(s))  Urinalysis, Routine w reflex microscopic (not at [facility])     Status: None   Collection Time: 09/04/14 11:50 AM  Result Value Ref Range   Color, Urine YELLOW YELLOW   APPearance CLEAR CLEAR   Specific Gravity, Urine 1.010 1.005 - 1.030   pH 7.0 5.0 - 8.0   Glucose, UA NEGATIVE NEGATIVE mg/dL   Hgb urine dipstick NEGATIVE NEGATIVE   Bilirubin Urine NEGATIVE NEGATIVE   Ketones, ur NEGATIVE NEGATIVE mg/dL   Protein, ur NEGATIVE NEGATIVE mg/dL   Urobilinogen, UA 0.2 0.0 - 1.0 mg/dL   Nitrite NEGATIVE NEGATIVE   Leukocytes, UA NEGATIVE NEGATIVE    Comment: MICROSCOPIC NOT DONE ON URINES WITH  NEGATIVE PROTEIN, BLOOD, LEUKOCYTES, NITRITE, OR GLUCOSE <1000 mg/dL.  Pregnancy, urine POC     Status: Abnormal   Collection Time: 09/04/14 11:56 AM  Result Value Ref Range   Preg Test, Ur POSITIVE (A) NEGATIVE    Comment:        THE SENSITIVITY OF THIS METHODOLOGY IS >24 mIU/mL   Wet prep, genital     Status: Abnormal   Collection Time: 09/04/14 12:50 PM  Result Value Ref Range   Yeast Wet Prep HPF POC NONE SEEN NONE SEEN   Trich, Wet Prep NONE SEEN NONE SEEN   Clue Cells Wet Prep HPF POC FEW (A) NONE SEEN   WBC, Wet Prep HPF POC FEW (A) NONE SEEN    Comment: FEW BACTERIA SEEN    Review of Systems  Constitutional: Negative for fever.  Gastrointestinal: Positive for nausea and abdominal pain. Negative for vomiting, diarrhea and constipation.   Physical Exam   Blood pressure 106/56,  pulse 69, temperature 98.6 F (37 C), temperature source Oral, resp. rate 18, height 5' 3.25" (1.607 m), weight 45.02 kg (99 lb 4 oz), last menstrual period 07/03/2014, not currently breastfeeding.  Physical Exam  Constitutional: She is oriented to person, place, and time. She appears well-developed and well-nourished. No distress.  HENT:  Head: Normocephalic.  Eyes: Pupils are equal, round, and reactive to light.  Respiratory: Effort normal.  GI: Soft. Normal appearance. There is generalized tenderness. There is no rigidity, no rebound, no guarding and no CVA tenderness.  Musculoskeletal: Normal range of motion.  Neurological: She is alert and oriented to person, place, and time.  Skin: Skin is warm. She is not diaphoretic.  Psychiatric: Her behavior is normal.    MAU Course  Procedures  None  MDM + fetal heart tones by doppler   Phenergan 25 mg given PO Tylenol 1 gram given  Following medication patient states her nausea is gone and her HA is down to a 2/10  Wet prep and GC collected by RN  Assessment and Plan   A:  1. Abdominal pain in pregnancy   2. Nausea and vomiting during pregnancy     P:  Discharge home in stable condition RX: Phenergan for nausea Referral to the high risk [facility] made; if the patient does not hear from the clinic by Friday the patient is encouraged to call them.  First trimester warning signs discussed  Return to MAU if symptoms worsen Tylenol is ok; as directed on the bottle Small, frequent meals Increase PO water intake.    Duane Lope, NP 09/04/2014 12:34 PM

## 2014-09-04 NOTE — Discharge Instructions (Signed)

## 2014-09-04 NOTE — MAU Note (Signed)
Patient presents at [redacted] weeks gestation with c/o lower back pain since this morning; lower abdominal pain this morning and nausea since 0900. Denies bleeding or discharge.

## 2014-09-05 LAB — GC/CHLAMYDIA PROBE AMP (~~LOC~~) NOT AT ARMC
CHLAMYDIA, DNA PROBE: NEGATIVE
Neisseria Gonorrhea: NEGATIVE

## 2014-09-05 LAB — HIV ANTIBODY (ROUTINE TESTING W REFLEX): HIV Screen 4th Generation wRfx: NONREACTIVE

## 2014-09-10 ENCOUNTER — Encounter: Payer: Self-pay | Admitting: Family Medicine

## 2014-09-26 ENCOUNTER — Encounter: Payer: Self-pay | Admitting: Family

## 2014-09-26 ENCOUNTER — Ambulatory Visit (INDEPENDENT_AMBULATORY_CARE_PROVIDER_SITE_OTHER): Payer: Self-pay | Admitting: Family

## 2014-09-26 VITALS — BP 114/64 | HR 92 | Wt 103.0 lb

## 2014-09-26 DIAGNOSIS — O0992 Supervision of high risk pregnancy, unspecified, second trimester: Secondary | ICD-10-CM

## 2014-09-26 DIAGNOSIS — O09292 Supervision of pregnancy with other poor reproductive or obstetric history, second trimester: Secondary | ICD-10-CM

## 2014-09-26 DIAGNOSIS — Z124 Encounter for screening for malignant neoplasm of cervix: Secondary | ICD-10-CM

## 2014-09-26 LAB — POCT URINALYSIS DIP (DEVICE)
Bilirubin Urine: NEGATIVE
Glucose, UA: NEGATIVE mg/dL
Ketones, ur: NEGATIVE mg/dL
Nitrite: NEGATIVE
Protein, ur: NEGATIVE mg/dL
Urobilinogen, UA: 1 mg/dL (ref 0.0–1.0)
pH: 6.5 (ref 5.0–8.0)

## 2014-09-26 LAB — HEPATITIS B SURFACE ANTIGEN: Hepatitis B Surface Ag: NEGATIVE

## 2014-09-26 MED ORDER — CONCEPT DHA 53.5-38-1 MG PO CAPS
1.0000 | ORAL_CAPSULE | Freq: Every day | ORAL | Status: DC
Start: 2014-09-26 — End: 2015-04-08

## 2014-09-26 NOTE — Progress Notes (Signed)
Subjective:    [name] is a [id number] [redacted]w[redacted]d being seen today for her first obstetrical visit.  Her obstetrical history is significant for preterm delivery x 2 and a 19 week SAB.  Used 17-p in last pregnancy.  . Patient does intend to breast feed. Pregnancy history fully reviewed.  Patient reports intermittent lower back pain.  Filed Vitals:   09/26/14 0857  BP: 114/64  Pulse: 92  Weight: 103 lb (46.72 kg)    HISTORY: OB History  Gravida Para Term Preterm AB SAB TAB Ectopic Multiple Living     # Outcome Date GA Lbr Len/2nd Weight Sex Delivery Anes PTL Lv  4 Current           3 Preterm 09/11/13 [redacted]w[redacted]d 185:01 / 00:04 4 lb 5.8 oz (1.98 kg) M Vag-Spont None  Y  2 Preterm 11/24/12 [redacted]w[redacted]d 04:55 / 00:45 3 lb 15.9 oz (1.81 kg) F Vag-Spont None  Y  1 SAB  [redacted]w[redacted]d      N      Comments: PPROM at 19 weeks 3 days; ??incomop cervix-did not labor at home, came in with membranes ruptured.     Past Medical History  Diagnosis Date  . No pertinent past medical history   . UTI (lower urinary tract infection)   . Anemia   . Preterm delivery   . Stillbirth    Past Surgical History  Procedure Laterality Date  . No past surgeries     History reviewed. No pertinent family history.   Exam    BP 114/64 mmHg  Pulse 92  Wt 103 lb (46.72 kg)  LMP 07/03/2014 (Exact Date) Uterine Size: size > dates  Pelvic Exam:    Perineum: No Hemorrhoids, Normal Perineum   Vulva: normal   Vagina:  normal mucosa, normal discharge, no palpable nodules   pH: Not done   Cervix: no bleeding following Pap, no cervical motion tenderness and no lesions   Adnexa: normal adnexa and no mass, fullness, tenderness   Bony Pelvis: Adequate  System: Breast:  No nipple retraction or dimpling, No nipple discharge or bleeding, No axillary or supraclavicular adenopathy, Normal to palpation without dominant masses   Skin: normal coloration and turgor, no rashes    Neurologic: negative   Extremities: normal strength, tone, and muscle mass   HEENT neck supple with midline trachea and thyroid without masses   Mouth/Teeth mucous membranes moist, pharynx normal without lesions   Neck supple and no masses   Cardiovascular: regular rate and rhythm, no murmurs or gallops   Respiratory:  appears well, vitals normal, no respiratory distress, acyanotic, normal RR, neck free of mass or lymphadenopathy, chest clear, no wheezing, crepitations, rhonchi, normal symmetric air entry   Abdomen: soft, non-tender; bowel sounds normal; no masses,  no organomegaly   Urinary: urethral meatus normal      Assessment:    Pregnancy:   24 y.o. [id number] at [redacted]w[redacted]d wks IUP  Size greater than dates  Patient Active Problem List   Diagnosis Date Noted  . Vaginal Pap smear with ASC-US 10/18/2013  . Preterm labor in third trimester without delivery 09/11/2013  . Preterm spontaneous labor with preterm delivery 09/11/2013  . Pregnancy 09/04/2013  . Preterm labor 11/11/2012  . History of preterm premature rupture of membranes (PROM) in previous pregnancy, currently pregnant in second trimester 09/25/2012  . Supervision of high risk pregnancy in second trimester 09/25/2012  Plan:     Initial labs drawn. Pap smear collected.   Prenatal vitamins. Problem list reviewed and updated. Genetic Screening discussed First Screen: ordered. 17p application today  Follow up in 3 weeks.  [name] 09/26/2014

## 2014-09-26 NOTE — Patient Instructions (Signed)
First Trimester of Pregnancy The first trimester of pregnancy is from week 1 until the end of week 12 (months 1 through 3). A week after a sperm fertilizes an egg, the egg will implant on the wall of the uterus. This embryo will begin to develop into a baby. Genes from you and your partner are forming the baby. The female genes determine whether the baby is a boy or a girl. At 6-8 weeks, the eyes and face are formed, and the heartbeat can be seen on ultrasound. At the end of 12 weeks, all the baby's organs are formed.  Now that you are pregnant, you will want to do everything you can to have a healthy baby. Two of the most important things are to get good prenatal care and to follow your health care provider's instructions. Prenatal care is all the medical care you receive before the baby's birth. This care will help prevent, find, and treat any problems during the pregnancy and childbirth. BODY CHANGES Your body goes through many changes during pregnancy. The changes vary from woman to woman.   You may gain or lose a couple of pounds at first.  You may feel sick to your stomach (nauseous) and throw up (vomit). If the vomiting is uncontrollable, call your health care provider.  You may tire easily.  You may develop headaches that can be relieved by medicines approved by your health care provider.  You may urinate more often. Painful urination may mean you have a bladder infection.  You may develop heartburn as a result of your pregnancy.  You may develop constipation because certain hormones are causing the muscles that push waste through your intestines to slow down.  You may develop hemorrhoids or swollen, bulging veins (varicose veins).  Your breasts may begin to grow larger and become tender. Your nipples may stick out more, and the tissue that surrounds them (areola) may become darker.  Your gums may bleed and may be sensitive to brushing and flossing.  Dark spots or blotches (chloasma,  mask of pregnancy) may develop on your face. This will likely fade after the baby is born.  Your menstrual periods will stop.  You may have a loss of appetite.  You may develop cravings for certain kinds of food.  You may have changes in your emotions from day to day, such as being excited to be pregnant or being concerned that something may go wrong with the pregnancy and baby.  You may have more vivid and strange dreams.  You may have changes in your hair. These can include thickening of your hair, rapid growth, and changes in texture. Some women also have hair loss during or after pregnancy, or hair that feels dry or thin. Your hair will most likely return to normal after your baby is born. WHAT TO EXPECT AT YOUR PRENATAL VISITS During a routine prenatal visit:  You will be weighed to make sure you and the baby are growing normally.  Your blood pressure will be taken.  Your abdomen will be measured to track your baby's growth.  The fetal heartbeat will be listened to starting around week 10 or 12 of your pregnancy.  Test results from any previous visits will be discussed. Your health care provider may ask you:  How you are feeling.  If you are feeling the baby move.  If you have had any abnormal symptoms, such as leaking fluid, bleeding, severe headaches, or abdominal cramping.  If you have any questions. Other tests   that may be performed during your first trimester include:  Blood tests to find your blood type and to check for the presence of any previous infections. They will also be used to check for low iron levels (anemia) and Rh antibodies. Later in the pregnancy, blood tests for diabetes will be done along with other tests if problems develop.  Urine tests to check for infections, diabetes, or protein in the urine.  An ultrasound to confirm the proper growth and development of the baby.  An amniocentesis to check for possible genetic problems.  Fetal screens for  spina bifida and Down syndrome.  You may need other tests to make sure you and the baby are doing well. HOME CARE INSTRUCTIONS  Medicines  Follow your health care provider's instructions regarding medicine use. Specific medicines may be either safe or unsafe to take during pregnancy.  Take your prenatal vitamins as directed.  If you develop constipation, try taking a stool softener if your health care provider approves. Diet  Eat regular, well-balanced meals. Choose a variety of foods, such as meat or vegetable-based protein, fish, milk and low-fat dairy products, vegetables, fruits, and whole grain breads and cereals. Your health care provider will help you determine the amount of weight gain that is right for you.  Avoid raw meat and uncooked cheese. These carry germs that can cause birth defects in the baby.  Eating four or five small meals rather than three large meals a day may help relieve nausea and vomiting. If you start to feel nauseous, eating a few soda crackers can be helpful. Drinking liquids between meals instead of during meals also seems to help nausea and vomiting.  If you develop constipation, eat more high-fiber foods, such as fresh vegetables or fruit and whole grains. Drink enough fluids to keep your urine clear or pale yellow. Activity and Exercise  Exercise only as directed by your health care provider. Exercising will help you:  Control your weight.  Stay in shape.  Be prepared for labor and delivery.  Experiencing pain or cramping in the lower abdomen or low back is a good sign that you should stop exercising. Check with your health care provider before continuing normal exercises.  Try to avoid standing for long periods of time. Move your legs often if you must stand in one place for a long time.  Avoid heavy lifting.  Wear low-heeled shoes, and practice good posture.  You may continue to have sex unless your health care provider directs you  otherwise. Relief of Pain or Discomfort  Wear a good support bra for breast tenderness.   Take warm sitz baths to soothe any pain or discomfort caused by hemorrhoids. Use hemorrhoid cream if your health care provider approves.   Rest with your legs elevated if you have leg cramps or low back pain.  If you develop varicose veins in your legs, wear support hose. Elevate your feet for 15 minutes, 3-4 times a day. Limit salt in your diet. Prenatal Care  Schedule your prenatal visits by the twelfth week of pregnancy. They are usually scheduled monthly at first, then more often in the last 2 months before delivery.  Write down your questions. Take them to your prenatal visits.  Keep all your prenatal visits as directed by your health care provider. Safety  Wear your seat belt at all times when driving.  Make a list of emergency phone numbers, including numbers for family, friends, the hospital, and police and fire departments. General Tips    Ask your health care provider for a referral to a local prenatal education class. Begin classes no later than at the beginning of month 6 of your pregnancy.  Ask for help if you have counseling or nutritional needs during pregnancy. Your health care provider can offer advice or refer you to specialists for help with various needs.  Do not use hot tubs, steam rooms, or saunas.  Do not douche or use tampons or scented sanitary pads.  Do not cross your legs for long periods of time.  Avoid cat litter boxes and soil used by cats. These carry germs that can cause birth defects in the baby and possibly loss of the fetus by miscarriage or stillbirth.  Avoid all smoking, herbs, alcohol, and medicines not prescribed by your health care provider. Chemicals in these affect the formation and growth of the baby.  Schedule a dentist appointment. At home, brush your teeth with a soft toothbrush and be gentle when you floss. SEEK MEDICAL CARE IF:   You have  dizziness.  You have mild pelvic cramps, pelvic pressure, or nagging pain in the abdominal area.  You have persistent nausea, vomiting, or diarrhea.  You have a bad smelling vaginal discharge.  You have pain with urination.  You notice increased swelling in your face, hands, legs, or ankles. SEEK IMMEDIATE MEDICAL CARE IF:   You have a fever.  You are leaking fluid from your vagina.  You have spotting or bleeding from your vagina.  You have severe abdominal cramping or pain.  You have rapid weight gain or loss.  You vomit blood or material that looks like coffee grounds.  You are exposed to German measles and have never had them.  You are exposed to fifth disease or chickenpox.  You develop a severe headache.  You have shortness of breath.  You have any kind of trauma, such as from a fall or a car accident. Document Released: 02/16/2001 Document Revised: 07/09/2013 Document Reviewed: 01/02/2013 Exit[employer] Patient Information 2015 [employer]. This information is not intended to replace advice given to you by your health care provider. Make sure you discuss any questions you have with your health care provider.  

## 2014-09-26 NOTE — Progress Notes (Signed)
First trimester screening scheduled for 10/08/2014 @ 2:00PM

## 2014-09-26 NOTE — Progress Notes (Signed)
Needs refill on phenergen

## 2014-09-26 NOTE — Progress Notes (Signed)
Nutrition note: 1st visit consult Pt was underwt prior to pregnancy. Pt has gained 0# @ [redacted]w[redacted]d but pt had lost some wt in the beginning & has started to regain. Pt reports having N&V but is taking phenergen, which is helping. Pt reports eating 6x/d. Pt reports drinking minimal water (a lot of juice, sprite, or sweet tea).  Pt is not taking PNV but plans to get some today. Pt reports no heartburn. Pt received verbal & written education on general nutrition during pregnancy. Encouraged PNV. Discussed ways to increase water intake & decrease sugar-sweetened drinks. Discussed wt gain goals of 28-40# or 1#/wk in 2nd & 3rd trimester. Pt agrees to start taking a PNV. Pt does not have WIC but plans to apply. Pt plans to BF. F/u as needed Blondell Reveal, MS, RD, LDN, [facility]

## 2014-09-27 LAB — RPR

## 2014-09-27 LAB — ANTIBODY SCREEN: ANTIBODY SCREEN: NEGATIVE

## 2014-09-27 LAB — ABO AND RH: Rh Type: POSITIVE

## 2014-09-27 LAB — CYTOLOGY - PAP

## 2014-09-28 LAB — PRESCRIPTION MONITORING PROFILE (19 PANEL)
Amphetamine/Meth: NEGATIVE ng/mL
Barbiturate Screen, Urine: NEGATIVE ng/mL
Benzodiazepine Screen, Urine: NEGATIVE ng/mL
Buprenorphine, Urine: NEGATIVE ng/mL
CREATININE, URINE: 168.79 mg/dL (ref >20.0)
Cannabinoid Scrn, Ur: NEGATIVE ng/mL
Carisoprodol, Urine: NEGATIVE ng/mL
Cocaine Metabolites: NEGATIVE ng/mL
Fentanyl, Ur: NEGATIVE ng/mL
MDMA URINE: NEGATIVE ng/mL
METHAQUALONE SCREEN (URINE): NEGATIVE ng/mL
Meperidine, Ur: NEGATIVE ng/mL
Methadone Screen, Urine: NEGATIVE ng/mL
Nitrites, Initial: NEGATIVE ug/mL
OPIATE SCREEN, URINE: NEGATIVE ng/mL
Oxycodone Screen, Ur: NEGATIVE ng/mL
PH URINE, INITIAL: 7.2 pH (ref 4.5–8.9)
PHENCYCLIDINE, UR: NEGATIVE ng/mL
PROPOXYPHENE: NEGATIVE ng/mL
Tapentadol, urine: NEGATIVE ng/mL
Tramadol Scrn, Ur: NEGATIVE ng/mL
Zolpidem, Urine: NEGATIVE ng/mL

## 2014-09-29 ENCOUNTER — Encounter: Payer: Self-pay | Admitting: Family Medicine

## 2014-09-29 LAB — CULTURE, OB URINE: Colony Count: >=100000

## 2014-09-30 ENCOUNTER — Encounter: Payer: Self-pay | Admitting: Family

## 2014-09-30 ENCOUNTER — Other Ambulatory Visit: Payer: Self-pay | Admitting: Family

## 2014-09-30 DIAGNOSIS — O2343 Unspecified infection of urinary tract in pregnancy, third trimester: Secondary | ICD-10-CM | POA: Insufficient documentation

## 2014-09-30 MED ORDER — CEPHALEXIN 500 MG PO CAPS: 500.0000 mg | ORAL_CAPSULE | Freq: Three times a day (TID) | ORAL | Status: DC

## 2014-10-01 ENCOUNTER — Telehealth: Payer: Self-pay | Admitting: Family

## 2014-10-01 LAB — RUBELLA ANTIBODY, IGM: RUBELLA: 0.6 (ref <0.91)

## 2014-10-01 NOTE — Telephone Encounter (Signed)
Pt notified regarding +GBS in urine and antibiotic sent to pharmacy for Keflex.  Pt verbalizes understanding and plans to pick up today.

## 2014-10-01 NOTE — Telephone Encounter (Signed)
Pt called back; E Coli in urine and not GBS; will not need treatment in labor.

## 2014-10-02 ENCOUNTER — Encounter: Payer: Self-pay | Admitting: Family

## 2014-10-02 DIAGNOSIS — O09899 Supervision of other high risk pregnancies, unspecified trimester: Secondary | ICD-10-CM | POA: Insufficient documentation

## 2014-10-02 DIAGNOSIS — Z283 Underimmunization status: Secondary | ICD-10-CM | POA: Insufficient documentation

## 2014-10-02 DIAGNOSIS — O9989 Other specified diseases and conditions complicating pregnancy, childbirth and the puerperium: Secondary | ICD-10-CM

## 2014-10-03 ENCOUNTER — Ambulatory Visit: Payer: Medicaid Other | Admitting: Obstetrics & Gynecology

## 2014-10-08 ENCOUNTER — Other Ambulatory Visit: Payer: Self-pay | Admitting: Family

## 2014-10-08 ENCOUNTER — Ambulatory Visit (HOSPITAL_COMMUNITY)
Admission: RE | Admit: 2014-10-08 | Discharge: 2014-10-08 | Disposition: A | Discharge status: Home / Self Care | Payer: Medicaid Other | Source: Ambulatory Visit | Attending: Family | Admitting: Family

## 2014-10-08 ENCOUNTER — Other Ambulatory Visit (HOSPITAL_COMMUNITY): Payer: Self-pay | Admitting: Maternal and Fetal Medicine

## 2014-10-08 ENCOUNTER — Encounter (HOSPITAL_COMMUNITY): Payer: Self-pay

## 2014-10-08 ENCOUNTER — Ambulatory Visit (HOSPITAL_COMMUNITY): Admission: RE | Admit: 2014-10-08 | Payer: Medicaid Other | Source: Ambulatory Visit

## 2014-10-08 DIAGNOSIS — O0992 Supervision of high risk pregnancy, unspecified, second trimester: Secondary | ICD-10-CM | POA: Insufficient documentation

## 2014-10-08 DIAGNOSIS — IMO0002 Reserved for concepts with insufficient information to code with codable children: Secondary | ICD-10-CM

## 2014-10-08 DIAGNOSIS — Z0489 Encounter for examination and observation for other specified reasons: Secondary | ICD-10-CM

## 2014-10-11 ENCOUNTER — Encounter: Payer: Self-pay | Admitting: Family

## 2014-10-17 ENCOUNTER — Encounter: Payer: Self-pay | Admitting: Family Medicine

## 2014-10-18 ENCOUNTER — Other Ambulatory Visit: Payer: Self-pay | Admitting: Obstetrics & Gynecology

## 2014-10-18 DIAGNOSIS — A599 Trichomoniasis, unspecified: Secondary | ICD-10-CM

## 2014-10-18 MED ORDER — METRONIDAZOLE 500 MG PO TABS: ORAL_TABLET | ORAL | Status: DC

## 2014-10-21 NOTE — Progress Notes (Signed)
Called and LM to return call to the Clinics its concerning results; it's non-emergent but please call at your earliest convenience.

## 2014-10-22 ENCOUNTER — Telehealth: Payer: Self-pay | Admitting: *Deleted

## 2014-10-22 NOTE — Telephone Encounter (Signed)
Pt left message stating that she is returning our call.

## 2014-10-22 NOTE — Progress Notes (Signed)
Called pt and LM that there is Rx at her [facility] off [address].  If she has any questions to please give our office a call.  Letter sent.  Will verify that pt took medication at [facility] appt scheduled for 10/29/14.

## 2014-10-22 NOTE — Telephone Encounter (Signed)
Called patient back and discussed that it looks like we tried to call her about her most recent urine culture that shows a UTI and a prescription has been sent to your pharmacy. Patient verbalized understanding and states she will go pick it up. Patient had no questions

## 2014-10-29 ENCOUNTER — Ambulatory Visit (HOSPITAL_COMMUNITY)
Admission: RE | Admit: 2014-10-29 | Discharge: 2014-10-29 | Disposition: A | Discharge status: Home / Self Care | Payer: Medicaid Other | Source: Ambulatory Visit | Attending: Family | Admitting: Family

## 2014-10-29 ENCOUNTER — Encounter (HOSPITAL_COMMUNITY): Payer: Self-pay

## 2014-10-29 ENCOUNTER — Ambulatory Visit (INDEPENDENT_AMBULATORY_CARE_PROVIDER_SITE_OTHER): Payer: Self-pay | Admitting: Family Medicine

## 2014-10-29 DIAGNOSIS — Z0489 Encounter for examination and observation for other specified reasons: Secondary | ICD-10-CM

## 2014-10-29 DIAGNOSIS — O9989 Other specified diseases and conditions complicating pregnancy, childbirth and the puerperium: Secondary | ICD-10-CM

## 2014-10-29 DIAGNOSIS — Z3482 Encounter for supervision of other normal pregnancy, second trimester: Secondary | ICD-10-CM

## 2014-10-29 DIAGNOSIS — Z283 Underimmunization status: Secondary | ICD-10-CM

## 2014-10-29 DIAGNOSIS — O0992 Supervision of high risk pregnancy, unspecified, second trimester: Secondary | ICD-10-CM

## 2014-10-29 DIAGNOSIS — Z36 Encounter for antenatal screening of mother: Secondary | ICD-10-CM | POA: Insufficient documentation

## 2014-10-29 DIAGNOSIS — O09292 Supervision of pregnancy with other poor reproductive or obstetric history, second trimester: Secondary | ICD-10-CM

## 2014-10-29 DIAGNOSIS — O09899 Supervision of other high risk pregnancies, unspecified trimester: Secondary | ICD-10-CM

## 2014-10-29 DIAGNOSIS — O2342 Unspecified infection of urinary tract in pregnancy, second trimester: Secondary | ICD-10-CM

## 2014-10-29 DIAGNOSIS — IMO0002 Reserved for concepts with insufficient information to code with codable children: Secondary | ICD-10-CM

## 2014-10-29 LAB — POCT URINALYSIS DIP (DEVICE)
BILIRUBIN URINE: NEGATIVE
Glucose, UA: NEGATIVE mg/dL
HGB URINE DIPSTICK: NEGATIVE
KETONES UR: NEGATIVE mg/dL
Nitrite: NEGATIVE
Protein, ur: NEGATIVE mg/dL
SPECIFIC GRAVITY, URINE: 1.01 (ref 1.005–1.030)
Urobilinogen, UA: 0.2 mg/dL (ref 0.0–1.0)
pH: 7 (ref 5.0–8.0)

## 2014-10-29 NOTE — Patient Instructions (Signed)
Second Trimester of Pregnancy The second trimester is from week 13 through week 28, months 4 through 6. The second trimester is often a time when you feel your best. Your body has also adjusted to being pregnant, and you begin to feel better physically. Usually, morning sickness has lessened or quit completely, you may have more energy, and you may have an increase in appetite. The second trimester is also a time when the fetus is growing rapidly. At the end of the sixth month, the fetus is about 9 inches long and weighs about 1 pounds. You will likely begin to feel the baby move (quickening) between 18 and 20 weeks of the pregnancy. BODY CHANGES Your body goes through many changes during pregnancy. The changes vary from woman to woman.   Your weight will continue to increase. You will notice your lower abdomen bulging out.  You may begin to get stretch marks on your hips, abdomen, and breasts.  You may develop headaches that can be relieved by medicines approved by your health care Cove Haydon.  You may urinate more often because the fetus is pressing on your bladder.  You may develop or continue to have heartburn as a result of your pregnancy.  You may develop constipation because certain hormones are causing the muscles that push waste through your intestines to slow down.  You may develop hemorrhoids or swollen, bulging veins (varicose veins).  You may have back pain because of the weight gain and pregnancy hormones relaxing your joints between the bones in your pelvis and as a result of a shift in weight and the muscles that support your balance.  Your breasts will continue to grow and be tender.  Your gums may bleed and may be sensitive to brushing and flossing.  Dark spots or blotches (chloasma, mask of pregnancy) may develop on your face. This will likely fade after the baby is born.  A dark line from your belly button to the pubic area (linea nigra) may appear. This will likely fade  after the baby is born.  You may have changes in your hair. These can include thickening of your hair, rapid growth, and changes in texture. Some women also have hair loss during or after pregnancy, or hair that feels dry or thin. Your hair will most likely return to normal after your baby is born. WHAT TO EXPECT AT YOUR PRENATAL VISITS During a routine prenatal visit:  You will be weighed to make sure you and the fetus are growing normally.  Your blood pressure will be taken.  Your abdomen will be measured to track your baby's growth.  The fetal heartbeat will be listened to.  Any test results from the previous visit will be discussed. Your health care Thai Burgueno may ask you:  How you are feeling.  If you are feeling the baby move.  If you have had any abnormal symptoms, such as leaking fluid, bleeding, severe headaches, or abdominal cramping.  If you have any questions. Other tests that may be performed during your second trimester include:  Blood tests that check for:  Low iron levels (anemia).  Gestational diabetes (between 24 and 28 weeks).  Rh antibodies.  Urine tests to check for infections, diabetes, or protein in the urine.  An ultrasound to confirm the proper growth and development of the baby.  An amniocentesis to check for possible genetic problems.  Fetal screens for spina bifida and Down syndrome. HOME CARE INSTRUCTIONS   Avoid all smoking, herbs, alcohol, and unprescribed   drugs. These chemicals affect the formation and growth of the baby.  Follow your health care Phoenicia Pirie's instructions regarding medicine use. There are medicines that are either safe or unsafe to take during pregnancy.  Exercise only as directed by your health care Brendan Gadson. Experiencing uterine cramps is a good sign to stop exercising.  Continue to eat regular, healthy meals.  Wear a good support bra for breast tenderness.  Do not use hot tubs, steam rooms, or saunas.  Wear your  seat belt at all times when driving.  Avoid raw meat, uncooked cheese, cat litter boxes, and soil used by cats. These carry germs that can cause birth defects in the baby.  Take your prenatal vitamins.  Try taking a stool softener (if your health care Cheryl Chay approves) if you develop constipation. Eat more high-fiber foods, such as fresh vegetables or fruit and whole grains. Drink plenty of fluids to keep your urine clear or pale yellow.  Take warm sitz baths to soothe any pain or discomfort caused by hemorrhoids. Use hemorrhoid cream if your health care Serenity Batley approves.  If you develop varicose veins, wear support hose. Elevate your feet for 15 minutes, 3-4 times a day. Limit salt in your diet.  Avoid heavy lifting, wear low heel shoes, and practice good posture.  Rest with your legs elevated if you have leg cramps or low back pain.  Visit your dentist if you have not gone yet during your pregnancy. Use a soft toothbrush to brush your teeth and be gentle when you floss.  A sexual relationship may be continued unless your health care Benyamin Jeff directs you otherwise.  Continue to go to all your prenatal visits as directed by your health care Brynley Cuddeback. SEEK MEDICAL CARE IF:   You have dizziness.  You have mild pelvic cramps, pelvic pressure, or nagging pain in the abdominal area.  You have persistent nausea, vomiting, or diarrhea.  You have a bad smelling vaginal discharge.  You have pain with urination. SEEK IMMEDIATE MEDICAL CARE IF:   You have a fever.  You are leaking fluid from your vagina.  You have spotting or bleeding from your vagina.  You have severe abdominal cramping or pain.  You have rapid weight gain or loss.  You have shortness of breath with chest pain.  You notice sudden or extreme swelling of your face, hands, ankles, feet, or legs.  You have not felt your baby move in over an hour.  You have severe headaches that do not go away with  medicine.  You have vision changes. Document Released: 02/16/2001 Document Revised: 02/27/2013 Document Reviewed: 04/25/2012 ExitCare Patient Information 2015 [employer]. This information is not intended to replace advice given to you by your health care Ynez Eugenio. Make sure you discuss any questions you have with your health care Morrill Bomkamp.  

## 2014-10-29 NOTE — Progress Notes (Signed)
Pain- lower abd  Educated pt on Benefits of Breastfeeding for Mom

## 2014-10-29 NOTE — Progress Notes (Signed)
Subjective:  [name] is a 24 y.o. G4P0212 at [redacted]w[redacted]d being seen today for ongoing prenatal care.  Patient reports fatigue.  Contractions: Not present.  Vag. Bleeding: None. Movement: Present. Denies leaking of fluid.   UTI - in July- treated with Keflex. No burning with urination. No polyuria.   The following portions of the patient's history were reviewed and updated as appropriate: allergies, current medications, past family history, past medical history, past social history, past surgical history and problem list.   Objective:   Filed Vitals:   10/29/14 1537  BP: 95/62  Pulse: 76  Temp: 98.5 F (36.9 C)  Weight: 105 lb 6.4 oz (47.809 kg)    Fetal Status: Fetal Heart Rate (bpm): 149   Movement: Present     General:  Alert, oriented and cooperative. Patient is in no acute distress.  Skin: Skin is warm and dry. No rash noted.   Cardiovascular: Normal heart rate noted  Respiratory: Normal respiratory effort, no problems with respiration noted  Abdomen: Soft, gravid, appropriate for gestational age. Pain/Pressure: Present     Pelvic: Vag. Bleeding: None     Cervical exam deferred        Extremities: Normal range of motion.  Edema: None  Mental Status: Normal mood and affect. Normal behavior. Normal judgment and thought content.   Urinalysis: Urine Protein: Negative Urine Glucose: Negative  Assessment and Plan:  Pregnancy: G4P0212 at [redacted]w[redacted]d  1. Preterm spontaneous labor with preterm delivery, not applicable or unspecified fetus - Has filled out 17 P application, confirmed in binder.  - Will have RN follow up on 17P as patient will need to start.  2. History of preterm premature rupture of membranes (PROM) in previous pregnancy, currently pregnant in second trimester -see above  3. Supervision of high risk pregnancy in second trimester - Updated box  4. Rubella non-immune status, antepartum PP MMR  5. UTI (urinary tract infection) during pregnancy, second  trimester Completed treatment. No need for prophy at this point.   Preterm labor symptoms and general obstetric precautions including but not limited to vaginal bleeding, contractions, leaking of fluid and fetal movement were reviewed in detail with the patient. Please refer to After Visit Summary for other counseling recommendations.  Return in about 4 weeks (around 11/26/2014) for Routine prenatal care.   Kimberly Niles Newton, MD  

## 2014-10-30 NOTE — Progress Notes (Signed)
Spoke with [employer], Cassandra, and was informed that pt was approved for [facility] and that shipment of medication will be sent tomorrow 10/31/14.

## 2014-10-31 ENCOUNTER — Telehealth: Payer: Self-pay | Admitting: General Practice

## 2014-10-31 ENCOUNTER — Encounter: Payer: Self-pay | Admitting: *Deleted

## 2014-10-31 NOTE — Telephone Encounter (Signed)
Opened in error

## 2014-11-04 ENCOUNTER — Inpatient Hospital Stay (HOSPITAL_COMMUNITY)
Admission: AD | Admit: 2014-11-04 | Discharge: 2014-11-04 | Disposition: A | Discharge status: Home / Self Care | Payer: Medicaid Other | Source: Ambulatory Visit | Attending: Obstetrics & Gynecology | Admitting: Obstetrics & Gynecology

## 2014-11-04 ENCOUNTER — Encounter (HOSPITAL_COMMUNITY): Payer: Self-pay

## 2014-11-04 DIAGNOSIS — O09899 Supervision of other high risk pregnancies, unspecified trimester: Secondary | ICD-10-CM

## 2014-11-04 DIAGNOSIS — O2342 Unspecified infection of urinary tract in pregnancy, second trimester: Secondary | ICD-10-CM

## 2014-11-04 DIAGNOSIS — O9989 Other specified diseases and conditions complicating pregnancy, childbirth and the puerperium: Secondary | ICD-10-CM

## 2014-11-04 DIAGNOSIS — O26892 Other specified pregnancy related conditions, second trimester: Secondary | ICD-10-CM | POA: Diagnosis not present

## 2014-11-04 DIAGNOSIS — R109 Unspecified abdominal pain: Secondary | ICD-10-CM | POA: Insufficient documentation

## 2014-11-04 DIAGNOSIS — Z3A20 20 weeks gestation of pregnancy: Secondary | ICD-10-CM | POA: Diagnosis not present

## 2014-11-04 DIAGNOSIS — O26899 Other specified pregnancy related conditions, unspecified trimester: Secondary | ICD-10-CM

## 2014-11-04 DIAGNOSIS — Z283 Underimmunization status: Secondary | ICD-10-CM

## 2014-11-04 LAB — URINALYSIS, ROUTINE W REFLEX MICROSCOPIC
Bilirubin Urine: NEGATIVE
Glucose, UA: NEGATIVE mg/dL
HGB URINE DIPSTICK: NEGATIVE
Ketones, ur: NEGATIVE mg/dL
Nitrite: NEGATIVE
PROTEIN: NEGATIVE mg/dL
Specific Gravity, Urine: 1.01 (ref 1.005–1.030)
UROBILINOGEN UA: 0.2 mg/dL (ref 0.0–1.0)
pH: 5.5 (ref 5.0–8.0)

## 2014-11-04 LAB — URINE MICROSCOPIC-ADD ON

## 2014-11-04 NOTE — Discharge Instructions (Signed)

## 2014-11-04 NOTE — MAU Provider Note (Signed)
History   [id number]   Chief Complaint  Patient presents with  . Contractions    HPI [name] is a 24 y.o. female  [phone] here with report of contractions every 10 minutes for past hour. Denies vaginal bleeding or discharge. Reports good fetal movement.  Sexual intercourse in past 24 hours.  Pt reports shortness of breath when laying fully supine.  No chest pain, palpitations, or shortness of breath at this time.    Patient's last menstrual period was 07/03/2014 (exact date).  OB History  Gravida Para Term Preterm AB SAB TAB Ectopic Multiple Living     # Outcome Date GA Lbr Len/2nd Weight Sex Delivery Anes PTL Lv  4 Current           3 Preterm 09/11/13 [redacted]w[redacted]d 185:01 / 00:04 1.98 kg (4 lb 5.8 oz) M Vag-Spont None  Y  2 Preterm 11/24/12 [redacted]w[redacted]d 04:55 / 00:45 1.81 kg (3 lb 15.9 oz) F Vag-Spont None  Y  1 SAB  [redacted]w[redacted]d      N      Comments: PPROM at 19 weeks 3 days; ??incomop cervix-did not labor at home, came in with membranes ruptured.      Past Medical History  Diagnosis Date  . No pertinent past medical history   . UTI (lower urinary tract infection)   . Anemia   . Preterm delivery   . Stillbirth     History reviewed. No pertinent family history.  Social History   Social History  . Marital Status: Single    Spouse Name: N/A  . Number of Children: N/A  . Years of Education: N/A   Social History Main Topics  . Smoking status: Never Smoker   . Smokeless tobacco: Never Used  . Alcohol Use: No  . Drug Use: No  . Sexual Activity: Yes    Birth Control/ Protection: None     Comment: Depo 08/04/2010   Other Topics Concern  . None   Social History Narrative    Allergies  Allergen Reactions  . Cherry Hives and Itching    No current facility-administered medications on file prior to encounter.   Current Outpatient Prescriptions on File Prior to Encounter  Medication Sig Dispense Refill  . acetaminophen (TYLENOL) 325 MG tablet Take 650 mg  by mouth every 6 (six) hours as needed.    . cephALEXin (KEFLEX) 500 MG capsule Take 1 capsule (500 mg total) by mouth 3 (three) times daily. (Patient not taking: Reported on 10/08/2014) 21 capsule 0  . metroNIDAZOLE (FLAGYL) 500 MG tablet Take two tablets by mouth twice a day, for one day.  Or you can take all four tablets at once if you can tolerate it. (Patient not taking: Reported on 10/29/2014) 4 tablet 0  . Prenat-FeFum-FePo-FA-Omega 3 (CONCEPT DHA) 53.5-38-1 MG CAPS Take 1 tablet by mouth daily. 30 capsule 2  . promethazine (PHENERGAN) 12.5 MG tablet Take 1 tablet (12.5 mg total) by mouth every 6 (six) hours as needed for nausea or vomiting. 30 tablet 0     Review of Systems  Respiratory: Negative for shortness of breath.   Cardiovascular: Negative for chest pain and palpitations.  Gastrointestinal: Positive for abdominal pain (contractions). Negative for nausea and vomiting.  Genitourinary: Negative for dysuria and vaginal bleeding.  All other systems reviewed and are negative.    Physical Exam   Filed Vitals:   11/04/14 0254  BP: 106/63  Pulse: 66  Temp: 98.3 F (36.8 C)  TempSrc: Oral  Resp: 18    Physical Exam  Constitutional: She is oriented to person, place, and time. She appears well-developed and well-nourished.  HENT:  Head: Normocephalic.  Neck: Normal range of motion. Neck supple.  Cardiovascular: Normal rate, regular rhythm and normal heart sounds.   Respiratory: Effort normal and breath sounds normal. No respiratory distress.  GI: Soft. There is no tenderness.  Genitourinary: No bleeding in the vagina. Vaginal discharge (mucusy) found.  Musculoskeletal: Normal range of motion. She exhibits no edema.  Neurological: She is alert and oriented to person, place, and time.  Skin: Skin is warm and dry.   Dilation: Closed Effacement (%): Thick Cervical Position: Posterior Exam by:: Margarita Mail. CNM   MAU Course  Procedures   Assessment and Plan  24 y.o.  [id number] at [redacted]w[redacted]d IUP  Abdominal Pain in Pregnancy - normal exam  Plan: Discharge to home Provided reassurance - discussed common occurrence of braxton hicks after intercourse Keep scheduled appt  Reviewed pregnancy precautions  [name], CNM 11/04/2014 3:10 AM

## 2014-11-04 NOTE — MAU Note (Signed)
Pt presents complaining of contractions every 10 minutes for 1 hour. Denies vaginal bleeding or discharge. Reports good fetal movement.

## 2014-11-21 ENCOUNTER — Encounter: Payer: Self-pay | Admitting: Obstetrics and Gynecology

## 2014-11-27 ENCOUNTER — Telehealth: Payer: Self-pay | Admitting: *Deleted

## 2014-11-27 NOTE — Telephone Encounter (Signed)
Attempted to call patient, no answer at either listed number. Called patient's mother who directed me to the appropriate phone number. Asked mom to let patient know it was extremely important that patient call the clinic. Called number given by mother and left voice mail informing her of an appointment scheduled for her tomorrow, 9/22 @ 1105. Please call the clinic for further questions.

## 2014-11-27 NOTE — Telephone Encounter (Signed)
[name] called and left a message today that she is calling a doctor's note for light duty- unable to understand some of what she said due to poor reception.

## 2014-11-28 ENCOUNTER — Ambulatory Visit (INDEPENDENT_AMBULATORY_CARE_PROVIDER_SITE_OTHER): Payer: Self-pay | Admitting: Obstetrics and Gynecology

## 2014-11-28 ENCOUNTER — Encounter: Payer: Self-pay | Admitting: General Practice

## 2014-11-28 ENCOUNTER — Encounter: Payer: Self-pay | Admitting: *Deleted

## 2014-11-28 ENCOUNTER — Encounter: Payer: Self-pay | Admitting: Obstetrics and Gynecology

## 2014-11-28 VITALS — BP 100/61 | HR 77 | Temp 97.5°F | Wt 110.0 lb

## 2014-11-28 DIAGNOSIS — O0992 Supervision of high risk pregnancy, unspecified, second trimester: Secondary | ICD-10-CM

## 2014-11-28 DIAGNOSIS — O09292 Supervision of pregnancy with other poor reproductive or obstetric history, second trimester: Secondary | ICD-10-CM

## 2014-11-28 DIAGNOSIS — Z283 Underimmunization status: Secondary | ICD-10-CM

## 2014-11-28 DIAGNOSIS — O9989 Other specified diseases and conditions complicating pregnancy, childbirth and the puerperium: Secondary | ICD-10-CM

## 2014-11-28 DIAGNOSIS — O09899 Supervision of other high risk pregnancies, unspecified trimester: Secondary | ICD-10-CM

## 2014-11-28 DIAGNOSIS — O09212 Supervision of pregnancy with history of pre-term labor, second trimester: Secondary | ICD-10-CM

## 2014-11-28 DIAGNOSIS — Z23 Encounter for immunization: Secondary | ICD-10-CM

## 2014-11-28 LAB — POCT URINALYSIS DIP (DEVICE)
Bilirubin Urine: NEGATIVE
GLUCOSE, UA: NEGATIVE mg/dL
HGB URINE DIPSTICK: NEGATIVE
Ketones, ur: NEGATIVE mg/dL
LEUKOCYTES UA: NEGATIVE
NITRITE: NEGATIVE
Protein, ur: NEGATIVE mg/dL
Specific Gravity, Urine: <=1.005 (ref 1.005–1.030)
UROBILINOGEN UA: 0.2 mg/dL (ref 0.0–1.0)
pH: 6 (ref 5.0–8.0)

## 2014-11-28 MED ORDER — HYDROXYPROGESTERONE CAPROATE 250 MG/ML IM OIL
250.0000 mg | TOPICAL_OIL | INTRAMUSCULAR | Status: DC
  Administered 2014-11-28 – 2014-12-27 (×2): 250 mg via INTRAMUSCULAR

## 2014-11-28 NOTE — Addendum Note (Signed)
Addended by: Kathee Delton on: 11/28/2014 11:55 AM   Modules accepted: Orders

## 2014-11-28 NOTE — Progress Notes (Signed)
Subjective:  [name] is a 24 y.o. [phone] at [redacted]w[redacted]d being seen today for ongoing prenatal care.  Patient reports no complaints.  Contractions: Irritability.  Vag. Bleeding: None. Movement: Present. Denies leaking of fluid.   The following portions of the patient's history were reviewed and updated as appropriate: allergies, current medications, past family history, past medical history, past social history, past surgical history and problem list.   Objective:   Filed Vitals:   11/28/14 1138  BP: 100/61  Pulse: 77  Temp: 97.5 F (36.4 C)  Weight: 110 lb (49.896 kg)    Fetal Status: Fetal Heart Rate (bpm): 145   Movement: Present     General:  Alert, oriented and cooperative. Patient is in no acute distress.  Skin: Skin is warm and dry. No rash noted.   Cardiovascular: Normal heart rate noted  Respiratory: Normal respiratory effort, no problems with respiration noted  Abdomen: Soft, gravid, appropriate for gestational age. Pain/Pressure: Absent     Pelvic: Vag. Bleeding: None     Cervical exam deferred        Extremities: Normal range of motion.  Edema: None  Mental Status: Normal mood and affect. Normal behavior. Normal judgment and thought content.   Urinalysis: Urine Protein: Negative Urine Glucose: Negative  Assessment and Plan:  Pregnancy: [id number] at [redacted]w[redacted]d  1. History of preterm premature rupture of membranes (PROM) in previous pregnancy, currently pregnant in second trimester Continue weekly 17-P  2. Supervision of high risk pregnancy in second trimester Flu vaccine today  3. Rubella non-immune status, antepartum   Preterm labor symptoms and general obstetric precautions including but not limited to vaginal bleeding, contractions, leaking of fluid and fetal movement were reviewed in detail with the patient. Please refer to After Visit Summary for other counseling recommendations.  Return in about 3 weeks (around 12/19/2014).   Catalina Antigua, MD\

## 2014-11-28 NOTE — Telephone Encounter (Signed)
Patient seen today in clinic and was given a restriction letter.

## 2014-12-05 ENCOUNTER — Ambulatory Visit: Payer: Medicaid Other

## 2014-12-11 ENCOUNTER — Inpatient Hospital Stay (HOSPITAL_COMMUNITY)
Admission: AD | Admit: 2014-12-11 | Discharge: 2014-12-13 | DRG: 778 | Disposition: A | Discharge status: Home / Self Care | Payer: Medicaid Other | Source: Ambulatory Visit | Attending: Obstetrics and Gynecology | Admitting: Obstetrics and Gynecology

## 2014-12-11 ENCOUNTER — Encounter (HOSPITAL_COMMUNITY): Payer: Self-pay

## 2014-12-11 DIAGNOSIS — Z2839 Other underimmunization status: Secondary | ICD-10-CM

## 2014-12-11 DIAGNOSIS — Z3A25 25 weeks gestation of pregnancy: Secondary | ICD-10-CM | POA: Diagnosis not present

## 2014-12-11 DIAGNOSIS — O9989 Other specified diseases and conditions complicating pregnancy, childbirth and the puerperium: Secondary | ICD-10-CM

## 2014-12-11 DIAGNOSIS — Z283 Underimmunization status: Secondary | ICD-10-CM

## 2014-12-11 DIAGNOSIS — K0889 Other specified disorders of teeth and supporting structures: Secondary | ICD-10-CM | POA: Diagnosis not present

## 2014-12-11 DIAGNOSIS — O2342 Unspecified infection of urinary tract in pregnancy, second trimester: Secondary | ICD-10-CM

## 2014-12-11 LAB — TYPE AND SCREEN
ABO/RH(D): AB POS
Antibody Screen: NEGATIVE

## 2014-12-11 LAB — CBC WITH DIFFERENTIAL/PLATELET
Basophils Absolute: 0 10*3/uL (ref 0.0–0.1)
Basophils Relative: 0 %
EOS ABS: 0 10*3/uL (ref 0.0–0.7)
Eosinophils Relative: 0 %
HEMATOCRIT: 31.6 % — AB (ref 36.0–46.0)
HEMOGLOBIN: 10.2 g/dL — AB (ref 12.0–15.0)
LYMPHS ABS: 0.7 10*3/uL (ref 0.7–4.0)
LYMPHS PCT: 5 %
MCH: 27 pg (ref 26.0–34.0)
MCHC: 32.3 g/dL (ref 30.0–36.0)
MCV: 83.6 fL (ref 78.0–100.0)
MONOS PCT: 1 %
Monocytes Absolute: 0.2 10*3/uL (ref 0.1–1.0)
NEUTROS PCT: 94 %
Neutro Abs: 13.1 10*3/uL — ABNORMAL HIGH (ref 1.7–7.7)
Platelets: 238 10*3/uL (ref 150–400)
RBC: 3.78 MIL/uL — ABNORMAL LOW (ref 3.87–5.11)
RDW: 13.8 % (ref 11.5–15.5)
WBC: 14 10*3/uL — ABNORMAL HIGH (ref 4.0–10.5)

## 2014-12-11 LAB — FETAL FIBRONECTIN: Fetal Fibronectin: NEGATIVE

## 2014-12-11 MED ORDER — NIFEDIPINE 10 MG PO CAPS
10.0000 mg | ORAL_CAPSULE | Freq: Once | ORAL | Status: AC
  Administered 2014-12-11: 10 mg via ORAL
  Filled 2014-12-11: qty 1

## 2014-12-11 MED ORDER — MAGNESIUM SULFATE BOLUS VIA INFUSION
4.0000 g | Freq: Once | INTRAVENOUS | Status: AC
  Administered 2014-12-11: 4 g via INTRAVENOUS
  Filled 2014-12-11: qty 500

## 2014-12-11 MED ORDER — CALCIUM CARBONATE ANTACID 500 MG PO CHEW: 2.0000 | CHEWABLE_TABLET | ORAL | Status: DC | PRN

## 2014-12-11 MED ORDER — BETAMETHASONE SOD PHOS & ACET 6 (3-3) MG/ML IJ SUSP
12.0000 mg | INTRAMUSCULAR | Status: AC
  Administered 2014-12-11 – 2014-12-12 (×2): 12 mg via INTRAMUSCULAR
  Filled 2014-12-11 (×3): qty 2

## 2014-12-11 MED ORDER — ACETAMINOPHEN 325 MG PO TABS: 650.0000 mg | ORAL_TABLET | ORAL | Status: DC | PRN

## 2014-12-11 MED ORDER — PRENATAL MULTIVITAMIN CH
1.0000 | ORAL_TABLET | Freq: Every day | ORAL | Status: DC
  Administered 2014-12-12 – 2014-12-13 (×2): 1 via ORAL
  Filled 2014-12-11 (×2): qty 1

## 2014-12-11 MED ORDER — MAGNESIUM SULFATE 50 % IJ SOLN
2.0000 g/h | INTRAVENOUS | Status: DC
  Administered 2014-12-12: 2 g/h via INTRAVENOUS
  Filled 2014-12-11 (×3): qty 80

## 2014-12-11 MED ORDER — LACTATED RINGERS IV BOLUS (SEPSIS)
1000.0000 mL | Freq: Once | INTRAVENOUS | Status: AC
  Administered 2014-12-11: 1000 mL via INTRAVENOUS

## 2014-12-11 MED ORDER — OXYCODONE-ACETAMINOPHEN 5-325 MG PO TABS
1.0000 | ORAL_TABLET | ORAL | Status: DC | PRN
  Administered 2014-12-11 (×2): 2 via ORAL
  Administered 2014-12-12 – 2014-12-13 (×2): 1 via ORAL
  Filled 2014-12-11 (×2): qty 2
  Filled 2014-12-11 (×2): qty 1

## 2014-12-11 MED ORDER — DOCUSATE SODIUM 100 MG PO CAPS
100.0000 mg | ORAL_CAPSULE | Freq: Every day | ORAL | Status: DC
  Administered 2014-12-11 – 2014-12-13 (×3): 100 mg via ORAL
  Filled 2014-12-11 (×3): qty 1

## 2014-12-11 MED ORDER — LACTATED RINGERS IV SOLN
INTRAVENOUS | Status: DC
  Administered 2014-12-11 – 2014-12-12 (×3): via INTRAVENOUS

## 2014-12-11 MED ORDER — AMOXICILLIN-POT CLAVULANATE 875-125 MG PO TABS
1.0000 | ORAL_TABLET | Freq: Two times a day (BID) | ORAL | Status: DC
  Administered 2014-12-11 – 2014-12-13 (×4): 1 via ORAL
  Filled 2014-12-11 (×8): qty 1

## 2014-12-11 MED ORDER — ZOLPIDEM TARTRATE 5 MG PO TABS: 5.0000 mg | ORAL_TABLET | Freq: Every evening | ORAL | Status: DC | PRN

## 2014-12-11 NOTE — H&P (Signed)
ANTEPARTUM ADMISSION HISTORY AND PHYSICAL NOTE  History of Present Illness: [name] is a 24 y.o. [phone] at [redacted]w[redacted]d admitted for preterm uterine irritability, failed Procardia, Hx 19wk PPROM, 33 and 34 wk deliveries. Admitted from MAU, where she presented with 2-day history of left-sided dental pain and month-long history of contractions, which have intensified over the last week.   Patient has not had sexual intercourse in the last 48 hours, has not had increased physical activity, and denies any trauma. She has been receiving 17-P.   Patient reports the fetal movement as active. Patient reports uterine contraction  activity as frequent, as painful to 8/10. Patient reports  vaginal bleeding as none. Patient describes fluid per vagina as None. Fetal presentation is unsure.  Review of Systems - Denies HA, change in vision, trouble swallowing, chest pain, dizziness, fevers or chills. Does endorse SOB when sitting upright.   Patient Active Problem List   Diagnosis Date Noted  . Preterm labor 12/11/2014  . Rubella non-immune status, antepartum 10/02/2014  . UTI (urinary tract infection) during pregnancy 09/30/2014  . Vaginal Pap smear with ASC-US 10/18/2013  . Preterm spontaneous labor with preterm delivery 09/11/2013  . Pregnancy 09/04/2013  . History of preterm premature rupture of membranes (PROM) in previous pregnancy, currently pregnant in second trimester 09/25/2012  . Supervision of high risk pregnancy in second trimester 09/25/2012    Past Medical History  Diagnosis Date  . No pertinent past medical history   . UTI (lower urinary tract infection)   . Anemia   . Preterm delivery   . Stillbirth     Past Surgical History  Procedure Laterality Date  . No past surgeries      OB History  Gravida Para Term Preterm AB SAB TAB Ectopic Multiple Living  4 2 0 2 1 1 0 0 0 2     # Outcome Date GA Lbr Len/2nd Weight Sex Delivery Anes PTL Lv  4 Current           3 Preterm  09/11/13 [redacted]w[redacted]d 185:01 / 00:04 1.98 kg (4 lb 5.8 oz) M Vag-Spont None  Y  2 Preterm 11/24/12 [redacted]w[redacted]d 04:55 / 00:45 1.81 kg (3 lb 15.9 oz) F Vag-Spont None  Y  1 SAB  [redacted]w[redacted]d      N      Comments: PPROM at 19 weeks 3 days; ??incomop cervix-did not labor at home, came in with membranes ruptured.      Social History   Social History  . Marital Status: Single    Spouse Name: N/A  . Number of Children: N/A  . Years of Education: N/A   Social History Main Topics  . Smoking status: Never Smoker   . Smokeless tobacco: Never Used  . Alcohol Use: No  . Drug Use: No  . Sexual Activity: Yes    Birth Control/ Protection: None     Comment: Depo 08/04/2010   Other Topics Concern  . None   Social History Narrative    History reviewed. No pertinent family history.  Allergies  Allergen Reactions  . Cherry Hives and Itching    Facility-administered medications prior to admission  Medication Dose Route Frequency Provider Last Rate Last Dose  . hydroxyprogesterone caproate (DELALUTIN) 250 mg/mL injection 250 mg  250 mg Intramuscular Weekly Peggy Constant, MD   250 mg at 11/28/14 1154   Prescriptions prior to admission  Medication Sig Dispense Refill Last Dose  . acetaminophen (TYLENOL) 325 MG tablet Take 650 mg by mouth every  6 (six) hours as needed.   Taking  . Prenat-FeFum-FePo-FA-Omega 3 (CONCEPT DHA) 53.5-38-1 MG CAPS Take 1 tablet by mouth daily. 30 capsule 2 Taking    Vitals:  BP 116/68 mmHg  Pulse 113  Ht  (1.6 m)  Wt 54.432 kg (120 lb)  BMI 21.26 kg/m2  SpO2 100%  LMP 07/03/2014 (Exact Date) Physical Examination: CONSTITUTIONAL: Well-developed, thin female in mild distress.  HENT:  Normocephalic, atraumatic.  Oropharynx: TTP over mandibular left second molar, which appears to have been partially extracted with pink gum tissue now covering. No obvious abscess. TTP and warm to touch over left mandible. No asymmetrical facial swelling. SKIN: Skin is warm and dry. No rash  noted. Not diaphoretic. No erythema. No pallor. NEUROLGIC: Alert and oriented to person, place, and time. No cranial nerve deficit noted. PSYCHIATRIC: Normal mood and affect. Normal behavior. Normal judgment and thought content. CARDIOVASCULAR: Normal heart rate noted, regular rhythm RESPIRATORY: Effort and breath sounds normal, no problems with respiration noted ABDOMEN: Soft, nontender, nondistended, gravid. MUSCULOSKELETAL: Normal range of motion. No edema and no tenderness. 2+ distal pulses.  Cervix: Evaluated by digital exam., Position: posterior and Dilation: 0.5 cm and thick and firm. Membranes:intact Fetal Monitoring:Baseline: 140 bpm, Variability: Good {> 6 bpm), Accelerations: Reactive and Decelerations: Absent Tocometer: UI  Labs:  Results for orders placed or performed during the hospital encounter of 12/11/14 (from the past 24 hour(s))  Fetal fibronectin   Collection Time: 12/11/14  1:10 PM  Result Value Ref Range   Fetal Fibronectin NEGATIVE NEGATIVE   UA: nml  Imaging: EKG normal sinus rhythm.   Assessment and Plan: Patient Active Problem List   Diagnosis Date Noted  . Preterm labor 12/11/2014  . Rubella non-immune status, antepartum 10/02/2014  . UTI (urinary tract infection) during pregnancy 09/30/2014  . Vaginal Pap smear with ASC-US 10/18/2013  . Preterm spontaneous labor with preterm delivery 09/11/2013  . Pregnancy 09/04/2013  . History of preterm premature rupture of membranes (PROM) in previous pregnancy, currently pregnant in second trimester 09/25/2012  . Supervision of high risk pregnancy in second trimester 09/25/2012   Admit to Antenatal Routine antenatal care  Continuous fetal heart rate and tocometry monitoring.  Administer betamethasone x2 and IV magnesium. Neonatal Consult ordered to discuss what to expect if delivery were to occur at this point in pregnancy. Augmentin and percocet ordered to treat dental pain and possible infection.     [name], MD PGY-1 [facility]  Seen and examined by me also Agree with note Will admit for betamethasone and monitoring Will start Augmentin for oral/tooth infection Aviva Signs, CNM

## 2014-12-11 NOTE — MAU Note (Signed)
Report given to Dr. Sampson Goon patient presents with SOB, chest pain , tooth ache, and contractions for 2 days. Received order for EKG. Resident will evaluate patient in room 2 MAU.

## 2014-12-11 NOTE — MAU Note (Signed)
Pt presents to MAU with complaints of SOB, Contractions, and a toothache for two days. Denies any vaginal bleeding or LOF

## 2014-12-11 NOTE — MAU Note (Cosign Needed)
Patient is 24 y.o. [name] [redacted]w[redacted]d with history significant for preterm delivery x 2 and a 19 week SAB. Used 17-p in last pregnancy.  Patient here with complaints of left-sided dental pain x 2 days and contractions that have intensified over the last week.  +FM, denies LOF, VB, vaginal discharge. Does endorse contractions.  Contractions: -Has been experiencing contractions for the past month. -Ctx have intensified over last week. -Previous children born at [redacted]w[redacted]d and [redacted]w[redacted]d. -Has been receiving 17-P  Toothache: -Pain is an 8/10.  -Has not improved with tylenol. -Patient states she had tooth extracted at age 24.  -Does not currently have a dentist.  ROS: Denies HA, change in vision, trouble swallowing, chest pain, dizziness, fevers or chills. Does endorse SOB when sitting upright.   Blood pressure 113/74, pulse 82, last menstrual period 07/03/2014, SpO2 100 %, not currently breastfeeding. On admission, pulse was in the 140s.   Physical Exam  General: Thin, well-appearing in mild distress HENT: TTP over mandibular left second molar, which appears to have been partially extracted with pink gum tissue now covering. No obvious abscess. TTP and warm to touch over left mandible. No asymmetrical swelling. Cardiac: RRR, S1, S2, no murmurs, rubs or gallops. Pulm: CTAB Pelvic: Cervix high and dilated to fingertip.  Imaging: EKG normal sinus rhythm.  Fetal monitoring: Irritability with contractions  Labs: FFN: Negative UA: nml  A: Patient is 24 y.o. [id number] [redacted]w[redacted]d reporting contractions, concerning given history of pre-term deliveries, and dental pain likely secondary to gingivitis. Can not rule out pre-term labor despite negative fetal fibronection due to PMH. Dental infection may be contributing to contractions. Patient is normotensive and given UA with specific gravity of 1.010 without ketones or casts, dehydration does not appear to be contributing to contractions.   P:  -Contractions  continued s/p administration of 10 mg procardia x 2 and 1L IV lactated ringers. Admit to ANTE for observation and administration of IV magnesium and betamethasone x2.  -Will order augmentin and percocet for dental pain upon admission.       Seen and examined by me also Agree with note Will try procardia for irritability and may need to give IV hydration No benefits from above Consulted Dr Constant, will admit for magnesium sulfate tocolysis and betamethasone Aviva Signs, CNM

## 2014-12-12 ENCOUNTER — Ambulatory Visit: Payer: Medicaid Other

## 2014-12-12 NOTE — Progress Notes (Signed)
Patient ID: [name], female   DOB: [birth year], 24 y.o.   MRN: [id number] FACULTY PRACTICE ANTEPARTUM(COMPREHENSIVE) NOTE  [name] is a 24 y.o. [name] with Estimated Date of Delivery: 03/24/15    [redacted]w[redacted]d  who is admitted for Preterm labor.  H/O 33 34 week deliveries and PPROM at 19 weeks with previous pregnancies, on 17P  Fetal presentation is unsure. Length of Stay:  1  Days  Date of admission:12/11/2014  Subjective: No abdominal pain Patient reports the fetal movement as active. Patient reports uterine contraction  activity as none. Patient reports  vaginal bleeding as none. Patient describes fluid per vagina as None.  Vitals:  Blood pressure 108/59, pulse 93, temperature 97.8 F (36.6 C), temperature source Oral, resp. rate 18, height  (1.6 m), weight 120 lb (54.432 kg), last menstrual period 07/03/2014, SpO2 99 %, not currently breastfeeding. Filed Vitals:   12/12/14 0430 12/12/14 0500 12/12/14 0530 12/12/14 0602  BP: 80/53 105/64 107/60 108/59  Pulse: 98 92 96 93  Temp:      TempSrc:      Resp:  16  18  Height:      Weight:      SpO2: 99% 99% 99%    Physical Examination:  General appearance - alert, well appearing, and in no distress Abdomen - soft, nontender, nondistended, no masses or organomegaly Fundal Height:  size equals dates Pelvic Exam:  normal external genitalia, vulva, vagina, cervix, uterus and adnexa, examination not indicated Cervical Exam:  Extremities: extremities normal, atraumatic, no cyanosis or edema with DTRs 1+ Membranes:intact  Fetal Monitoring:  Baseline: 145 bpm     Labs:  Results for orders placed or performed during the hospital encounter of 12/11/14 (from the past 24 hour(s))  Fetal fibronectin   Collection Time: 12/11/14  1:10 PM  Result Value Ref Range   Fetal Fibronectin NEGATIVE NEGATIVE  CBC with Differential/Platelet   Collection Time: 12/11/14  7:00 PM  Result Value Ref Range   WBC 14.0 (H) 4.0 - 10.5 K/uL   RBC  3.78 (L) 3.87 - 5.11 MIL/uL   Hemoglobin 10.2 (L) 12.0 - 15.0 g/dL   HCT 14.7 (L) 82.9 - 56.2 %   MCV 83.6 78.0 - 100.0 fL   MCH 27.0 26.0 - 34.0 pg   MCHC 32.3 30.0 - 36.0 g/dL   RDW 13.0 86.5 - 78.4 %   Platelets 238 150 - 400 K/uL   Neutrophils Relative % 94 %   Neutro Abs 13.1 (H) 1.7 - 7.7 K/uL   Lymphocytes Relative 5 %   Lymphs Abs 0.7 0.7 - 4.0 K/uL   Monocytes Relative 1 %   Monocytes Absolute 0.2 0.1 - 1.0 K/uL   Eosinophils Relative 0 %   Eosinophils Absolute 0.0 0.0 - 0.7 K/uL   Basophils Relative 0 %   Basophils Absolute 0.0 0.0 - 0.1 K/uL  Type and screen   Collection Time: 12/11/14  7:00 PM  Result Value Ref Range   ABO/RH(D) AB POS    Antibody Screen NEG    Sample Expiration 12/14/2014     Imaging Studies:     Medications:  Scheduled . amoxicillin-clavulanate  1 tablet Oral Q12H  . betamethasone acetate-betamethasone sodium phosphate  12 mg Intramuscular Q24H  . docusate sodium  100 mg Oral Daily  . prenatal multivitamin  1 tablet Oral Q1200   I have reviewed the patient's current medications.  ASSESSMENT: [id number] [redacted]w[redacted]d Estimated Date of Delivery: 03/24/15  Patient Active Problem List  Diagnosis Date Noted  . Preterm labor 12/11/2014  . Rubella non-immune status, antepartum 10/02/2014  . UTI (urinary tract infection) during pregnancy 09/30/2014  . Vaginal Pap smear with ASC-US 10/18/2013  . Preterm spontaneous labor with preterm delivery 09/11/2013  . Pregnancy 09/04/2013  . History of preterm premature rupture of membranes (PROM) in previous pregnancy, currently pregnant in second trimester 09/25/2012  . Supervision of high risk pregnancy in second trimester 09/25/2012    PLAN: Continue magnesium for now, receiving 2nd dose of betamethasone this evening, FFN negative  EURE,LUTHER H 12/12/2014,7:12 AM

## 2014-12-12 NOTE — Consult Note (Signed)
Neonatology Consult Note:  At the request of the patients obstetrician Dr. Elly Modena I met with Emmit Pomfret who is a 24 y.o. [phone] at 60 3 weeks currently with pregnancy complicated by preterm uterine irritability, failed Procardia, Hx 19wk PPROM, 33 and 34 wk deliveries. Admitted from MAU, where she presented with 2-day history of left-sided dental pain and month-long history of contractions, which have intensified over the last week.  She has been receiving 17-P.  She was admitted for continuous fetal heart rate and tocometry monitoring and has received a course of betamethasone and is on IV magnesium sulfate.  She is on Augmentin and percocet as treatment for dental pain and possible infection.   We discussed morbidity/mortality at this gestional age, delivery room resuscitation, including intubation and surfactant in DR.  Discussed mechanical ventilation and risk for chronic lung disease, risk for IVH with potential for motor / cognitive deficits, ROP, NEC, sepsis, as well as temperature instability and feeding immaturity.  Discussed NG / OG feeds, benefits of MBM in reducing incidence of NEC.   Discussed likely length of stay.  Thank you for allowing [place] to participate in her care.  Please call with questions.  Higinio Roger, DO  Neonatologist  The total length of face-to-face or floor / unit time for this encounter was 20 minutes.  Counseling and / or coordination of care was greater than fifty percent of the time.

## 2014-12-13 MED ORDER — OXYCODONE-ACETAMINOPHEN 5-325 MG PO TABS: 1.0000 | ORAL_TABLET | ORAL | Status: DC | PRN

## 2014-12-13 MED ORDER — NIFEDIPINE ER OSMOTIC RELEASE 30 MG PO TB24
30.0000 mg | ORAL_TABLET | Freq: Every day | ORAL | Status: DC
  Administered 2014-12-13: 30 mg via ORAL
  Filled 2014-12-13: qty 1

## 2014-12-13 MED ORDER — AMOXICILLIN-POT CLAVULANATE 875-125 MG PO TABS: 1.0000 | ORAL_TABLET | Freq: Two times a day (BID) | ORAL | Status: DC

## 2014-12-13 MED ORDER — NIFEDIPINE ER 30 MG PO TB24: 30.0000 mg | ORAL_TABLET | Freq: Every day | ORAL | Status: DC

## 2014-12-13 NOTE — Discharge Instructions (Signed)

## 2014-12-13 NOTE — Discharge Summary (Signed)
Antenatal Physician Discharge Summary  Patient ID: [name] MRN: [id number] DOB/AGE: [birth year] 24 y.o.  Admit date: 12/11/2014 Discharge date: 12/13/2014  Admission Diagnoses: Preterm contractions, threatened preterm labor at [redacted]w[redacted]d  Discharge Diagnoses:  The same at [redacted]w[redacted]d  Prenatal Procedures: NST  Consults: Neonatology  Hospital Course:  This is a 24 y.o. [name] with IUP at [redacted]w[redacted]d admitted from 12/11/14 to 12/13/14 for preterm contractions, threatened preterm labor.  She was admitted with regular contractions, noted to have a cervical exam of FT/thivk/long, FFN neg.  No leaking of fluid and no bleeding.  She was initially started on magnesium sulfate for tocolysis and neuroprotection and also received betamethasone x 2 doses.  Her tocolysis was transitioned to Procardia. She was seen by Neonatology during her stay.  She was observed, fetal heart rate monitoring remained reassuring, and she had no signs/symptoms of progressing preterm labor or other maternal-fetal concerns.  Her cervical exam was unchanged from admission.  She was deemed stable for discharge to home with outpatient follow up.  Of note, patient also had dental pain during her admission. She was started on Augmentin and given Percocet for pain, will follow up with dentist later.  Discharge Exam: BP 99/52 mmHg  Pulse 93  Temp(Src) 98.5 F (36.9 C) (Oral)  Resp 16  Ht  (1.6 m)  Wt 120 lb (54.432 kg)  BMI 21.26 kg/m2  SpO2 98%  LMP 07/03/2014 (Exact Date)  CONSTITUTIONAL: Well-developed, well-nourished female in no acute distress.  HENT:  Normocephalic, atraumatic, External right and left ear normal. Oropharynx is clear and moist EYES: Conjunctivae and EOM are normal. Pupils are equal, round, and reactive to light. No scleral icterus.  NECK: Normal range of motion, supple, no masses SKIN: Skin is warm and dry. No rash noted. Not diaphoretic. No erythema. No pallor. NEUROLGIC: Alert and oriented to person, place,  and time. Normal reflexes, muscle tone coordination. No cranial nerve deficit noted. PSYCHIATRIC: Normal mood and affect. Normal behavior. Normal judgment and thought content. CARDIOVASCULAR: Normal heart rate noted, regular rhythm RESPIRATORY: Effort and breath sounds normal, no problems with respiration noted MUSCULOSKELETAL: Normal range of motion. No edema and no tenderness. 2+ distal pulses. ABDOMEN: Soft, nontender, nondistended, gravid. CERVIX: Dilation: Closed  Effacement (%): Thick  Cervical Position: Posterior  Fetal monitoring: FHR: 130 bpm, Variability: moderate, Accelerations: Present, Decelerations: occasional variable but overall reassuring for GA  Uterine activity: rare contractions per hour  Significant Diagnostic Studies:  Results for orders placed or performed during the hospital encounter of 12/11/14 (from the past 168 hour(s))  Fetal fibronectin   Collection Time: 12/11/14  1:10 PM  Result Value Ref Range   Fetal Fibronectin NEGATIVE NEGATIVE  CBC with Differential/Platelet   Collection Time: 12/11/14  7:00 PM  Result Value Ref Range   WBC 14.0 (H) 4.0 - 10.5 K/uL   RBC 3.78 (L) 3.87 - 5.11 MIL/uL   Hemoglobin 10.2 (L) 12.0 - 15.0 g/dL   HCT 14.7 (L) 82.9 - 56.2 %   MCV 83.6 78.0 - 100.0 fL   MCH 27.0 26.0 - 34.0 pg   MCHC 32.3 30.0 - 36.0 g/dL   RDW 13.0 86.5 - 78.4 %   Platelets 238 150 - 400 K/uL   Neutrophils Relative % 94 %   Neutro Abs 13.1 (H) 1.7 - 7.7 K/uL   Lymphocytes Relative 5 %   Lymphs Abs 0.7 0.7 - 4.0 K/uL   Monocytes Relative 1 %   Monocytes Absolute 0.2 0.1 - 1.0 K/uL  Eosinophils Relative 0 %   Eosinophils Absolute 0.0 0.0 - 0.7 K/uL   Basophils Relative 0 %   Basophils Absolute 0.0 0.0 - 0.1 K/uL  Type and screen   Collection Time: 12/11/14  7:00 PM  Result Value Ref Range   ABO/RH(D) AB POS    Antibody Screen NEG    Sample Expiration 12/14/2014     Discharge Condition: Stable  Disposition: 01-Home or Self Care      Medication List    TAKE these medications        acetaminophen 325 MG tablet  Commonly known as:  TYLENOL  Take 650 mg by mouth every 6 (six) hours as needed for headache.     amoxicillin-clavulanate 875-125 MG tablet  Commonly known as:  AUGMENTIN  Take 1 tablet by mouth every 12 (twelve) hours.     CONCEPT DHA 53.5-38-1 MG Caps  Take 1 tablet by mouth daily.     hydroxyprogesterone caproate 250 mg/mL Oil injection  Commonly known as:  DELALUTIN  Inject 250 mg into the muscle every 7 (seven) days. On Thursdays For 14 doses starting on 11-28-14     NIFEdipine 30 MG 24 hr tablet  Commonly known as:  PROCARDIA-XL/ADALAT CC  Take 1 tablet (30 mg total) by mouth daily. Can take twice a day if needed to control symptoms     oxyCODONE-acetaminophen 5-325 MG tablet  Commonly known as:  PERCOCET/ROXICET  Take 1-2 tablets by mouth every 4 (four) hours as needed for moderate pain (tooth pain).           Follow-up Information    Follow up with STINSON, JACOB JEHIEL, DO On 12/19/2014.   Specialty:  Family Medicine   Why:  11:05am for ongoing prenatal care. Call clinic/come to [facility] for any concerning issues   Contact information:   [address] [place] [address] [phone]       Signed: Tereso Newcomer M.D. 12/13/2014, 2:04 PM

## 2014-12-13 NOTE — Progress Notes (Signed)
Patient stable for discharge.  Discharge instructions discussed patient understands signs and symptoms of pre term labor.

## 2014-12-13 NOTE — Progress Notes (Signed)
Patient ID: [name], female   DOB: 10-14-1990, 24 y.o.   MRN: [id number] FACULTY PRACTICE ANTEPARTUM(COMPREHENSIVE) NOTE  Kimberlly Norgard is a 24 y.o. [id number] with Estimated Date of Delivery: 03/24/15    [redacted]w[redacted]d  who is admitted for Preterm labor.  H/O 33 34 week deliveries and PPROM at 19 weeks with previous pregnancies, on 17P  Fetal presentation is unsure. Length of Stay:  2  Days  Date of admission:12/11/2014  Subjective: Patient reports no contractions.  Having some uterine irritability on monitor.  Also reports resolution of tooth pain.  Patient reports the fetal movement as active. Patient reports uterine contraction  activity as none. Patient reports  vaginal bleeding as none. Patient describes fluid per vagina as None.  Vitals:  Blood pressure 104/60, pulse 96, temperature 98.4 F (36.9 C), temperature source Oral, resp. rate 18, height  (1.6 m), weight 120 lb (54.432 kg), last menstrual period 07/03/2014, SpO2 98 %, not currently breastfeeding. Filed Vitals:   12/13/14 0300 12/13/14 0407 12/13/14 0500 12/13/14 0600  BP:  104/60    Pulse:  96    Temp:  98.4 F (36.9 C)    TempSrc:  Oral    Resp: Height:      Weight:      SpO2:       Physical Examination:  General appearance - alert, well appearing, and in no distress Heart: regular rate, no murmur Lungs: clear to auscultation bilaterally, no wheezing. Abdomen - soft, nontender, nondistended, no masses or organomegaly Fundal Height:  size equals dates Pelvic Exam:  normal external genitalia, vulva, vagina, cervix, uterus and adnexa, examination not indicated Cervical Exam:  Extremities: extremities normal, atraumatic, no cyanosis or edema  Membranes:intact  Fetal Monitoring:  Baseline: 145 bpm, moderate variability, appropriate for gestational age     Labs:  No results found for this or any previous visit (from the past 24 hour(s)).  Imaging Studies:     Medications:  Scheduled .  amoxicillin-clavulanate  1 tablet Oral Q12H  . docusate sodium  100 mg Oral Daily  . NIFEdipine  30 mg Oral Daily  . prenatal multivitamin  1 tablet Oral Q1200   I have reviewed the patient's current medications.  ASSESSMENT: [id number] [redacted]w[redacted]d Estimated Date of Delivery: 03/24/15  Patient Active Problem List   Diagnosis Date Noted  . Preterm labor 12/11/2014  . Rubella non-immune status, antepartum 10/02/2014  . UTI (urinary tract infection) during pregnancy 09/30/2014  . Vaginal Pap smear with ASC-US 10/18/2013  . Preterm spontaneous labor with preterm delivery 09/11/2013  . Pregnancy 09/04/2013  . History of preterm premature rupture of membranes (PROM) in previous pregnancy, currently pregnant in second trimester 09/25/2012  . Supervision of high risk pregnancy in second trimester 09/25/2012    PLAN: 1.  Preterm Labor  S/p BMZ x2  FFN negative  D/c magnesium  Start procardia XL  daily  Possible d/c later today or tomorrow AM if stable.  Angelos Wasco JEHIEL 12/13/2014,6:58 AM

## 2014-12-17 ENCOUNTER — Encounter: Payer: Self-pay | Admitting: Family Medicine

## 2014-12-17 ENCOUNTER — Encounter: Payer: Self-pay | Admitting: Obstetrics & Gynecology

## 2014-12-17 ENCOUNTER — Encounter: Payer: Self-pay | Admitting: Obstetrics and Gynecology

## 2014-12-17 ENCOUNTER — Other Ambulatory Visit: Payer: Self-pay | Admitting: Obstetrics & Gynecology

## 2014-12-19 ENCOUNTER — Encounter: Payer: Self-pay | Admitting: Family Medicine

## 2014-12-19 ENCOUNTER — Ambulatory Visit (INDEPENDENT_AMBULATORY_CARE_PROVIDER_SITE_OTHER): Payer: Medicaid Other | Admitting: Family Medicine

## 2014-12-19 VITALS — BP 115/63 | HR 87 | Temp 98.4°F | Wt 111.9 lb

## 2014-12-19 DIAGNOSIS — O09292 Supervision of pregnancy with other poor reproductive or obstetric history, second trimester: Secondary | ICD-10-CM

## 2014-12-19 DIAGNOSIS — O0992 Supervision of high risk pregnancy, unspecified, second trimester: Secondary | ICD-10-CM

## 2014-12-19 NOTE — Progress Notes (Signed)
Subjective:  [name] is a 24 y.o. [phone] at 6537w3d being seen today for ongoing prenatal care.  Patient reports occasional contractions.  Contractions: Not present.  Vag. Bleeding: None. Movement: Present. Denies leaking of fluid.   The following portions of the patient's history were reviewed and updated as appropriate: allergies, current medications, past family history, past medical history, past social history, past surgical history and problem list. Problem list updated.  Objective:   Filed Vitals:   12/19/14 1156  BP: 115/63  Pulse: 87  Temp: 98.4 F (36.9 C)  Weight: 111 lb 14.4 oz (50.758 kg)    Fetal Status: Fetal Heart Rate (bpm): 154   Movement: Present     General:  Alert, oriented and cooperative. Patient is in no acute distress.  Skin: Skin is warm and dry. No rash noted.   Cardiovascular: Normal heart rate noted  Respiratory: Normal respiratory effort, no problems with respiration noted  Abdomen: Soft, gravid, appropriate for gestational age. Pain/Pressure: Absent     Pelvic: Vag. Bleeding: None     Cervical exam deferred        Extremities: Normal range of motion.     Mental Status: Normal mood and affect. Normal behavior. Normal judgment and thought content.   Urinalysis:      Assessment and Plan:  Pregnancy: [id number] at 3337w3d  1. Supervision of high risk pregnancy in second trimester FHT normal, FH normal  2. History of preterm premature rupture of membranes (PROM) in previous pregnancy, currently pregnant in second trimester Continue 17-P, prometrium, procardia   Preterm labor symptoms and general obstetric precautions including but not limited to vaginal bleeding, contractions, leaking of fluid and fetal movement were reviewed in detail with the patient. Please refer to After Visit Summary for other counseling recommendations.  No Follow-up on file.   Levie HeritageJacob J Stinson, DO

## 2014-12-19 NOTE — Patient Instructions (Signed)
Preterm Labor Information Preterm labor is when labor starts before you are [redacted] weeks pregnant. The normal length of pregnancy is 39 to 41 weeks.  CAUSES  The cause of preterm labor is not often known. The most common known cause is infection. RISK FACTORS  Having a history of preterm labor.  Having your water break before it should.  Having a placenta that covers the opening of the cervix.  Having a placenta that breaks away from the uterus.  Having a cervix that is too weak to hold the baby in the uterus.  Having too much fluid in the amniotic sac.  Taking drugs or smoking while pregnant.  Not gaining enough weight while pregnant.  Being younger than 46 and older than 22 years old.  Having a low income.  Being African American. SYMPTOMS  Period-like cramps, belly (abdominal) pain, or back pain.  Contractions that are regular, as often as six in an hour. They may be mild or painful.  Contractions that start at the top of the belly. They then move to the lower belly and back.  Lower belly pressure that seems to get stronger.  Bleeding from the vagina.  Fluid leaking from the vagina. TREATMENT  Treatment depends on:  Your condition.  The condition of your baby.  How many weeks pregnant you are. Your doctor may have you:  Take medicine to stop contractions.  Stay in bed except to use the restroom (bed rest).  Stay in the hospital. WHAT SHOULD YOU DO IF YOU THINK YOU ARE IN PRETERM LABOR? Call your doctor right away. You need to go to the hospital right away.  HOW CAN YOU PREVENT PRETERM LABOR IN FUTURE PREGNANCIES?  Stop smoking, if you smoke.  Maintain healthy weight gain.  Do not take drugs or be around chemicals that are not needed.  Tell your doctor if you think you have an infection.  Tell your doctor if you had a preterm labor before.   This information is not intended to replace advice given to you by your health care provider. Make sure you  discuss any questions you have with your health care provider.   Document Released: 05/21/2008 Document Revised: 07/09/2014 Document Reviewed: 03/27/2012 Elsevier Interactive Patient Education 2016 [employer]. Second Trimester of Pregnancy The second trimester is from week 13 through week 28, months 4 through 6. The second trimester is often a time when you feel your best. Your body has also adjusted to being pregnant, and you begin to feel better physically. Usually, morning sickness has lessened or quit completely, you may have more energy, and you may have an increase in appetite. The second trimester is also a time when the fetus is growing rapidly. At the end of the sixth month, the fetus is about 9 inches long and weighs about 1 pounds. You will likely begin to feel the baby move (quickening) between 18 and 20 weeks of the pregnancy. BODY CHANGES Your body goes through many changes during pregnancy. The changes vary from woman to woman.   Your weight will continue to increase. You will notice your lower abdomen bulging out.  You may begin to get stretch marks on your hips, abdomen, and breasts.  You may develop headaches that can be relieved by medicines approved by your health care provider.  You may urinate more often because the fetus is pressing on your bladder.  You may develop or continue to have heartburn as a result of your pregnancy.  You may develop  constipation because certain hormones are causing the muscles that push waste through your intestines to slow down.  You may develop hemorrhoids or swollen, bulging veins (varicose veins).  You may have back pain because of the weight gain and pregnancy hormones relaxing your joints between the bones in your pelvis and as a result of a shift in weight and the muscles that support your balance.  Your breasts will continue to grow and be tender.  Your gums may bleed and may be sensitive to brushing and flossing.  Dark spots  or blotches (chloasma, mask of pregnancy) may develop on your face. This will likely fade after the baby is born.  A dark line from your belly button to the pubic area (linea nigra) may appear. This will likely fade after the baby is born.  You may have changes in your hair. These can include thickening of your hair, rapid growth, and changes in texture. Some women also have hair loss during or after pregnancy, or hair that feels dry or thin. Your hair will most likely return to normal after your baby is born. WHAT TO EXPECT AT YOUR PRENATAL VISITS During a routine prenatal visit:  You will be weighed to make sure you and the fetus are growing normally.  Your blood pressure will be taken.  Your abdomen will be measured to track your baby's growth.  The fetal heartbeat will be listened to.  Any test results from the previous visit will be discussed. Your health care provider may ask you:  How you are feeling.  If you are feeling the baby move.  If you have had any abnormal symptoms, such as leaking fluid, bleeding, severe headaches, or abdominal cramping.  If you are using any tobacco products, including cigarettes, chewing tobacco, and electronic cigarettes.  If you have any questions. Other tests that may be performed during your second trimester include:  Blood tests that check for:  Low iron levels (anemia).  Gestational diabetes (between 24 and 28 weeks).  Rh antibodies.  Urine tests to check for infections, diabetes, or protein in the urine.  An ultrasound to confirm the proper growth and development of the baby.  An amniocentesis to check for possible genetic problems.  Fetal screens for spina bifida and Down syndrome.  HIV (human immunodeficiency virus) testing. Routine prenatal testing includes screening for HIV, unless you choose not to have this test. HOME CARE INSTRUCTIONS   Avoid all smoking, herbs, alcohol, and unprescribed drugs. These chemicals affect  the formation and growth of the baby.  Do not use any tobacco products, including cigarettes, chewing tobacco, and electronic cigarettes. If you need help quitting, ask your health care provider. You may receive counseling support and other resources to help you quit.  Follow your health care provider's instructions regarding medicine use. There are medicines that are either safe or unsafe to take during pregnancy.  Exercise only as directed by your health care provider. Experiencing uterine cramps is a good sign to stop exercising.  Continue to eat regular, healthy meals.  Wear a good support bra for breast tenderness.  Do not use hot tubs, steam rooms, or saunas.  Wear your seat belt at all times when driving.  Avoid raw meat, uncooked cheese, cat litter boxes, and soil used by cats. These carry germs that can cause birth defects in the baby.  Take your prenatal vitamins.  Take 1500-2000 mg of calcium daily starting at the 20th week of pregnancy until you deliver your baby.  Try taking a stool softener (if your health care provider approves) if you develop constipation. Eat more high-fiber foods, such as fresh vegetables or fruit and whole grains. Drink plenty of fluids to keep your urine clear or pale yellow.  Take warm sitz baths to soothe any pain or discomfort caused by hemorrhoids. Use hemorrhoid cream if your health care provider approves.  If you develop varicose veins, wear support hose. Elevate your feet for 15 minutes, 3-4 times a day. Limit salt in your diet.  Avoid heavy lifting, wear low heel shoes, and practice good posture.  Rest with your legs elevated if you have leg cramps or low back pain.  Visit your dentist if you have not gone yet during your pregnancy. Use a soft toothbrush to brush your teeth and be gentle when you floss.  A sexual relationship may be continued unless your health care provider directs you otherwise.  Continue to go to all your prenatal  visits as directed by your health care provider. SEEK MEDICAL CARE IF:   You have dizziness.  You have mild pelvic cramps, pelvic pressure, or nagging pain in the abdominal area.  You have persistent nausea, vomiting, or diarrhea.  You have a bad smelling vaginal discharge.  You have pain with urination. SEEK IMMEDIATE MEDICAL CARE IF:   You have a fever.  You are leaking fluid from your vagina.  You have spotting or bleeding from your vagina.  You have severe abdominal cramping or pain.  You have rapid weight gain or loss.  You have shortness of breath with chest pain.  You notice sudden or extreme swelling of your face, hands, ankles, feet, or legs.  You have not felt your baby move in over an hour.  You have severe headaches that do not go away with medicine.  You have vision changes.   This information is not intended to replace advice given to you by your health care provider. Make sure you discuss any questions you have with your health care provider.   Document Released: 02/16/2001 Document Revised: 03/15/2014 Document Reviewed: 04/25/2012 Elsevier Interactive Patient Education [employer]

## 2014-12-22 ENCOUNTER — Encounter: Payer: Self-pay | Admitting: Family Medicine

## 2014-12-26 ENCOUNTER — Ambulatory Visit: Payer: Medicaid Other

## 2014-12-27 ENCOUNTER — Ambulatory Visit (INDEPENDENT_AMBULATORY_CARE_PROVIDER_SITE_OTHER): Payer: Medicaid Other | Admitting: Family Medicine

## 2014-12-27 VITALS — BP 108/60 | HR 91 | Temp 98.4°F | Wt 109.5 lb

## 2014-12-27 DIAGNOSIS — Z331 Pregnant state, incidental: Secondary | ICD-10-CM

## 2014-12-27 DIAGNOSIS — N750 Cyst of Bartholin's gland: Secondary | ICD-10-CM

## 2014-12-27 NOTE — Progress Notes (Signed)
   Subjective:    Patient ID: [name], female    DOB: 05/04/1990, 24 y.o.   MRN: [id number]  HPI Patient 8118w4d pregnant.  Noticed small bump on right vaginal area.  Not painful.  Started around Tuesday.  No palliating or provoking factors.    Review of Systems  Constitutional: Negative for fever, chills and fatigue.  Genitourinary: Negative for vaginal bleeding, vaginal discharge and vaginal pain.       Objective:   Physical Exam  Constitutional: She appears well-developed and well-nourished.  HENT:  Head: Normocephalic and atraumatic.  Genitourinary:    There is no rash, tenderness or lesion on the right labia. There is no rash, tenderness or lesion on the left labia.      Assessment & Plan:  1. Bartholin gland cyst Small, not infected.  Discussed that no reason to do anything.  If becomes painful, should return.

## 2014-12-27 NOTE — Patient Instructions (Signed)
Bartholin Cyst or Abscess A Bartholin cyst is a fluid-filled sac that forms on a Bartholin gland. Bartholin glands are small glands that are located within the folds of skin (labia) along the sides of the lower opening of the vagina. These glands produce a fluid to moisten the outside of the vagina during sexual intercourse. A Bartholin cyst causes a bulge on the side of the vagina. A cyst that is not large or infected may not cause symptoms or problems. However, if the fluid within the cyst becomes infected, the cyst can turn into an abscess. An abscess may cause discomfort or pain. CAUSES A Bartholin cyst may develop when the duct of the gland becomes blocked. In many cases, the cause of this is not known. Various kinds of bacteria can cause the cyst to become infected and develop into an abscess. RISK FACTORS You may be at an increased risk of developing a Bartholin cyst or abscess if:  You are a woman of reproductive age.  You have a history of previous Bartholin cysts or abscesses.  You have diabetes.  You have a sexually transmitted disease (STD). SIGNS AND SYMPTOMS The severity of symptoms varies depending on the size of the cyst and whether it is infected. Symptoms may include:  A bulge or swelling near the lower opening of your vagina.  Discomfort or pain.  Redness.  Pain during sexual intercourse.  Pain when walking.  Fluid draining from the area. DIAGNOSIS Your health care provider may make a diagnosis based on your symptoms and a physical exam. He or she will look for swelling in your vaginal area. Blood tests may be done to check for infections. A sample of fluid from the cyst or abscess may also be taken to be tested in a lab. TREATMENT Small cysts that are not infected may not require any treatment. These often go away on their own. Yourhealth care provider will recommend hot baths and the use of warm compresses. These may also be part of the treatment for an abscess.  Treatment options for a large cyst or abscess may include:   Antibiotic medicine.  A surgical procedure to drain the abscess. One of the following procedures may be done:  Incision and drainage. An incision is made in the cyst or abscess so that the fluid drains out. A catheter may be placed inside the cyst so that it does not close and fill up with fluid again. The catheter will be removed after you have a follow-up visit with a specialist (gynecologist).  Marsupialization. The cyst or abscess is opened and kept open by stitching the edges of the skin to the walls of the cyst or abscess. This allows it to continue to drain and not fill up with fluid again. If you have cysts or abscesses that keep returning and have required incision and drainage multiple times, your health care provider may talk to you about surgery to remove the Bartholin gland. HOME CARE INSTRUCTIONS  Take medicines only as directed by your health care provider.  If you were prescribed an antibiotic medicine, finish it all even if you start to feel better.  Apply warm, wet compresses to the area or take warm, shallow baths that cover your pelvic region (sitz baths) several times a day or as directed by your health care provider.  Do not squeeze the cyst or apply heavy pressure to it.  Do not have sexual intercourse until the cyst has gone away.  If your cyst or abscess was   opened, a small piece of gauze or a drain may have been placed in the area to allow drainage. Do not remove the gauze or the drain until directed by your health care provider.  Wear feminine pads--not tampons--as needed for any drainage or bleeding.  Keep all follow-up visits as directed by your health care provider. This is important. PREVENTION Take these steps to help prevent a Bartholin cyst from returning:  Practice good hygiene.   Clean your vaginal area with mild soap and a soft cloth when you bathe.  Practice safe sex to prevent  STDs. SEEK MEDICAL CARE IF:  You have increased pain, swelling, or redness in the area of the cyst.  Puslike drainage is coming from the cyst.  You have a fever.   This information is not intended to replace advice given to you by your health care provider. Make sure you discuss any questions you have with your health care provider.   Document Released: 02/22/2005 Document Revised: 03/15/2014 Document Reviewed: 10/08/2013 Elsevier Interactive Patient Education 2016 Elsevier Inc.  

## 2015-01-01 ENCOUNTER — Telehealth: Payer: Self-pay | Admitting: General Practice

## 2015-01-01 ENCOUNTER — Encounter: Payer: Self-pay | Admitting: Obstetrics & Gynecology

## 2015-01-01 ENCOUNTER — Encounter (HOSPITAL_COMMUNITY): Payer: Self-pay | Admitting: *Deleted

## 2015-01-01 ENCOUNTER — Inpatient Hospital Stay (HOSPITAL_COMMUNITY)
Admission: AD | Admit: 2015-01-01 | Discharge: 2015-01-01 | Disposition: A | Discharge status: Home / Self Care | Payer: Medicaid Other | Source: Ambulatory Visit | Attending: Family Medicine | Admitting: Family Medicine

## 2015-01-01 DIAGNOSIS — O98312 Other infections with a predominantly sexual mode of transmission complicating pregnancy, second trimester: Secondary | ICD-10-CM | POA: Insufficient documentation

## 2015-01-01 DIAGNOSIS — N898 Other specified noninflammatory disorders of vagina: Secondary | ICD-10-CM

## 2015-01-01 DIAGNOSIS — A599 Trichomoniasis, unspecified: Secondary | ICD-10-CM

## 2015-01-01 DIAGNOSIS — Z3A28 28 weeks gestation of pregnancy: Secondary | ICD-10-CM | POA: Insufficient documentation

## 2015-01-01 DIAGNOSIS — A5901 Trichomonal vulvovaginitis: Secondary | ICD-10-CM | POA: Insufficient documentation

## 2015-01-01 DIAGNOSIS — O26892 Other specified pregnancy related conditions, second trimester: Secondary | ICD-10-CM | POA: Diagnosis present

## 2015-01-01 LAB — WET PREP, GENITAL
CLUE CELLS WET PREP: NONE SEEN
YEAST WET PREP: NONE SEEN

## 2015-01-01 LAB — URINE MICROSCOPIC-ADD ON

## 2015-01-01 LAB — URINALYSIS, ROUTINE W REFLEX MICROSCOPIC
BILIRUBIN URINE: NEGATIVE
Glucose, UA: NEGATIVE mg/dL
KETONES UR: NEGATIVE mg/dL
NITRITE: NEGATIVE
Protein, ur: NEGATIVE mg/dL
SPECIFIC GRAVITY, URINE: 1.01 (ref 1.005–1.030)
UROBILINOGEN UA: 0.2 mg/dL (ref 0.0–1.0)
pH: 6 (ref 5.0–8.0)

## 2015-01-01 MED ORDER — NIFEDIPINE 10 MG PO CAPS
10.0000 mg | ORAL_CAPSULE | Freq: Once | ORAL | Status: AC
  Administered 2015-01-01: 10 mg via ORAL
  Filled 2015-01-01: qty 1

## 2015-01-01 MED ORDER — METRONIDAZOLE 500 MG PO TABS
2000.0000 mg | ORAL_TABLET | Freq: Once | ORAL | Status: AC
  Administered 2015-01-01: 2000 mg via ORAL
  Filled 2015-01-01: qty 4

## 2015-01-01 NOTE — MAU Note (Signed)
Pt presents to MAU with complaints of watery discharge since yesterday with contractions since yesterday. Denies any vaginal bleeding

## 2015-01-01 NOTE — Discharge Instructions (Signed)
Trichomoniasis Trichomoniasis is an infection caused by an organism called Trichomonas. The infection can affect both women and men. In women, the outer female genitalia and the vagina are affected. In men, the penis is mainly affected, but the prostate and other reproductive organs can also be involved. Trichomoniasis is a sexually transmitted infection (STI) and is most often passed to another person through sexual contact.  RISK FACTORS  Having unprotected sexual intercourse.  Having sexual intercourse with an infected partner. SIGNS AND SYMPTOMS  Symptoms of trichomoniasis in women include:  Abnormal gray-green frothy vaginal discharge.  Itching and irritation of the vagina.  Itching and irritation of the area outside the vagina. Symptoms of trichomoniasis in men include:   Penile discharge with or without pain.  Pain during urination. This results from inflammation of the urethra. DIAGNOSIS  Trichomoniasis may be found during a Pap test or physical exam. Your health care provider may use one of the following methods to help diagnose this infection:  Testing the pH of the vagina with a test tape.  Using a vaginal swab test that checks for the Trichomonas organism. A test is available that provides results within a few minutes.  Examining a urine sample.  Testing vaginal secretions. Your health care provider may test you for other STIs, including HIV. TREATMENT   You may be given medicine to fight the infection. Women should inform their health care provider if they could be or are pregnant. Some medicines used to treat the infection should not be taken during pregnancy.  Your health care provider may recommend over-the-counter medicines or creams to decrease itching or irritation.  Your sexual partner will need to be treated if infected.  Your health care provider may test you for infection again 3 months after treatment. HOME CARE INSTRUCTIONS   Take medicines only as  directed by your health care provider.  Take over-the-counter medicine for itching or irritation as directed by your health care provider.  Do not have sexual intercourse while you have the infection.  Women should not douche or wear tampons while they have the infection.  Discuss your infection with your partner. Your partner may have gotten the infection from you, or you may have gotten it from your partner.  Have your sex partner get examined and treated if necessary.  Practice safe, informed, and protected sex.  See your health care provider for other STI testing. SEEK MEDICAL CARE IF:   You still have symptoms after you finish your medicine.  You develop abdominal pain.  You have pain when you urinate.  You have bleeding after sexual intercourse.  You develop a rash.  Your medicine makes you sick or makes you throw up (vomit). MAKE SURE YOU:  Understand these instructions.  Will watch your condition.  Will get help right away if you are not doing well or get worse.   This information is not intended to replace advice given to you by your health care provider. Make sure you discuss any questions you have with your health care provider.   Document Released: 08/18/2000 Document Revised: 03/15/2014 Document Reviewed: 12/04/2012 Elsevier Interactive Patient Education 2016 Elsevier Inc.  

## 2015-01-01 NOTE — Telephone Encounter (Signed)
Opened in error

## 2015-01-01 NOTE — MAU Provider Note (Addendum)
History     CSN: [id number]  Arrival date and time: 01/01/15 1806   First Provider Initiated Contact with Patient 01/01/15 1915      Chief Complaint  Patient presents with  . Vaginal Discharge  . Contractions   HPI This is a 24 y.o. female at 2312w2d who presents with c/o fluid leaking from vagina since yesterday.  Also complains of low back pain that comes and goes  RN Note: Pt presents to MAU with complaints of watery discharge since yesterday with contractions since yesterday. Denies any vaginal bleeding        OB History    Gravida Para Term Preterm AB TAB SAB Ectopic Multiple Living   4 2 0 2 1 0 1 0 0 2       Past Medical History  Diagnosis Date  . No pertinent past medical history   . UTI (lower urinary tract infection)   . Anemia   . Preterm delivery   . Stillbirth     Past Surgical History  Procedure Laterality Date  . No past surgeries      No family history on file.  Social History  Substance Use Topics  . Smoking status: Never Smoker   . Smokeless tobacco: Never Used  . Alcohol Use: No    Allergies:  Allergies  Allergen Reactions  . Cherry Hives and Itching    Facility-administered medications prior to admission  Medication Dose Route Frequency Provider Last Rate Last Dose  . hydroxyprogesterone caproate (DELALUTIN) 250 mg/mL injection 250 mg  250 mg Intramuscular Weekly Peggy Constant, MD   250 mg at 12/27/14 1038   Prescriptions prior to admission  Medication Sig Dispense Refill Last Dose  . acetaminophen (TYLENOL) 325 MG tablet Take 650 mg by mouth every 6 (six) hours as needed for headache.    Taking  . amoxicillin-clavulanate (AUGMENTIN) 875-125 MG tablet Take 1 tablet by mouth every 12 (twelve) hours. (Patient not taking: Reported on 12/27/2014) 10 tablet 0 Not Taking  . hydroxyprogesterone caproate (DELALUTIN) 250 mg/mL OIL injection Inject 250 mg into the muscle every 7 (seven) days. On Thursdays For 14 doses starting on 11-28-14    Taking  . NIFEdipine (PROCARDIA-XL/ADALAT CC) 30 MG 24 hr tablet Take 1 tablet (30 mg total) by mouth daily. Can take twice a day if needed to control symptoms 60 tablet 3 Taking  . oxyCODONE-acetaminophen (PERCOCET/ROXICET) 5-325 MG tablet Take 1-2 tablets by mouth every 4 (four) hours as needed for moderate pain (tooth pain). (Patient not taking: Reported on 12/27/2014) 30 tablet 0 Not Taking  . Prenat-FeFum-FePo-FA-Omega 3 (CONCEPT DHA) 53.5-38-1 MG CAPS Take 1 tablet by mouth daily. 30 capsule 2 Taking    Review of Systems  Constitutional: Negative for fever, chills and malaise/fatigue.  Gastrointestinal: Positive for abdominal pain. Negative for nausea, vomiting, diarrhea and constipation.  Genitourinary:       Vaginal discharge, yellow green  Musculoskeletal: Positive for back pain.   Physical Exam   Blood pressure 109/66, pulse 90, temperature 98.9 F (37.2 C), temperature source Oral, resp. rate 18, height 5\' 3"  (1.6 m), weight 109 lb (49.442 kg), last menstrual period 07/03/2014, not currently breastfeeding.  Physical Exam  Constitutional: She is oriented to person, place, and time. She appears well-developed and well-nourished. No distress.  HENT:  Head: Normocephalic.  Cardiovascular: Normal rate and regular rhythm.   Respiratory: Effort normal. No respiratory distress.  GI: Soft. She exhibits no distension. There is no tenderness. There is no rebound and no  guarding.  FHR reactive Irregular contractions   Genitourinary: Vaginal discharge (copious yellow green frothy discharge) found.  .Dilation: Fingertip Effacement (%): Thick Station: Ballotable Exam by:: Shelia Media  Right vaginal wall cyst visible, yellow, round Approximately 1cm round See photo   Musculoskeletal: Normal range of motion.  Neurological: She is alert and oriented to person, place, and time.  Skin: Skin is warm and dry.  Psychiatric: She has a normal mood and affect.      MAU Course   Procedures  MDM Wet prep sent, suspicion for trich No pooling or ferning Results for orders placed or performed during the hospital encounter of 01/01/15 (from the past 24 hour(s))  Urinalysis, Routine w reflex microscopic (not at [facility])     Status: Abnormal   Collection Time: 01/01/15  6:15 PM  Result Value Ref Range   Color, Urine YELLOW YELLOW   APPearance CLEAR CLEAR   Specific Gravity, Urine 1.010 1.005 - 1.030   pH 6.0 5.0 - 8.0   Glucose, UA NEGATIVE NEGATIVE mg/dL   Hgb urine dipstick TRACE (A) NEGATIVE   Bilirubin Urine NEGATIVE NEGATIVE   Ketones, ur NEGATIVE NEGATIVE mg/dL   Protein, ur NEGATIVE NEGATIVE mg/dL   Urobilinogen, UA 0.2 0.0 - 1.0 mg/dL   Nitrite NEGATIVE NEGATIVE   Leukocytes, UA LARGE (A) NEGATIVE  Urine microscopic-add on     Status: Abnormal   Collection Time: 01/01/15  6:15 PM  Result Value Ref Range   Squamous Epithelial / LPF MANY (A) RARE   WBC, UA 11-20 <3 WBC/hpf   RBC / HPF 3-6 <3 RBC/hpf   Bacteria, UA MANY (A) RARE   Urine-Other TRICHOMONAS PRESENT   Wet prep, genital     Status: Abnormal   Collection Time: 01/01/15  7:20 PM  Result Value Ref Range   Yeast Wet Prep HPF POC NONE SEEN NONE SEEN   Trich, Wet Prep FEW (A) NONE SEEN   Clue Cells Wet Prep HPF POC NONE SEEN NONE SEEN   WBC, Wet Prep HPF POC MANY (A) NONE SEEN   Discussed Trich finding with patient and FOB.  Discussed need to treat and Rx for FOB. He then told me that he got an Rx today. Then she stated she already knew she has trichomonas.  I informed him of need to treat partner, then he said she was the one who had it first. Patient nodded. I told her of need to notify her partner, and she asked FOB "to tell him for me"  Procardia given for Contractions  Assessment and Plan  A;  SIUP at [redacted]w[redacted]d       Trichomonas infection      No evidence of ruptured membranes      Preterm contractions  P:  Report to oncoming CNM  [facility] 01/01/2015, 7:15 PM    Pt feeling  better and ready to go home after Procardia.  EFM 130s, +accels, no decels Ctx very irreg Cx not reexamined  IUP@28 .2wks Trichomonias infection  D/C home No sex x 1 week; all partners need tx F/U at [facility] as scheduled tomorrow (10/27)  Cam Hai CNM 01/01/2015 10:14 PM

## 2015-01-02 ENCOUNTER — Ambulatory Visit: Payer: Medicaid Other

## 2015-01-02 DIAGNOSIS — N898 Other specified noninflammatory disorders of vagina: Secondary | ICD-10-CM

## 2015-01-02 HISTORY — DX: Other specified noninflammatory disorders of vagina: N89.8

## 2015-01-09 ENCOUNTER — Encounter (HOSPITAL_COMMUNITY): Payer: Self-pay | Admitting: *Deleted

## 2015-01-09 ENCOUNTER — Encounter: Payer: Medicaid Other | Admitting: Advanced Practice Midwife

## 2015-01-09 ENCOUNTER — Inpatient Hospital Stay (HOSPITAL_COMMUNITY)
Admission: AD | Admit: 2015-01-09 | Discharge: 2015-01-09 | Disposition: A | Discharge status: Home / Self Care | Payer: Medicaid Other | Source: Ambulatory Visit | Attending: Obstetrics and Gynecology | Admitting: Obstetrics and Gynecology

## 2015-01-09 ENCOUNTER — Telehealth: Payer: Self-pay | Admitting: Advanced Practice Midwife

## 2015-01-09 DIAGNOSIS — R8271 Bacteriuria: Secondary | ICD-10-CM | POA: Diagnosis not present

## 2015-01-09 DIAGNOSIS — N898 Other specified noninflammatory disorders of vagina: Secondary | ICD-10-CM | POA: Diagnosis not present

## 2015-01-09 DIAGNOSIS — O4703 False labor before 37 completed weeks of gestation, third trimester: Secondary | ICD-10-CM | POA: Diagnosis not present

## 2015-01-09 DIAGNOSIS — Z3A29 29 weeks gestation of pregnancy: Secondary | ICD-10-CM | POA: Diagnosis not present

## 2015-01-09 DIAGNOSIS — O479 False labor, unspecified: Secondary | ICD-10-CM

## 2015-01-09 LAB — URINALYSIS, ROUTINE W REFLEX MICROSCOPIC
BILIRUBIN URINE: NEGATIVE
GLUCOSE, UA: 100 mg/dL — AB
KETONES UR: NEGATIVE mg/dL
NITRITE: NEGATIVE
PH: 6.5 (ref 5.0–8.0)
PROTEIN: NEGATIVE mg/dL
Specific Gravity, Urine: 1.02 (ref 1.005–1.030)
Urobilinogen, UA: 1 mg/dL (ref 0.0–1.0)

## 2015-01-09 LAB — URINE MICROSCOPIC-ADD ON

## 2015-01-09 LAB — AMNISURE RUPTURE OF MEMBRANE (ROM) NOT AT ARMC: Amnisure ROM: NEGATIVE

## 2015-01-09 NOTE — Discharge Instructions (Signed)
Braxton Hicks Contractions °Contractions of the uterus can occur throughout pregnancy. Contractions are not always a sign that you are in labor.  °WHAT ARE BRAXTON HICKS CONTRACTIONS?  °Contractions that occur before labor are called Braxton Hicks contractions, or false labor. Toward the end of pregnancy (32-34 weeks), these contractions can develop more often and may become more forceful. This is not true labor because these contractions do not result in opening (dilatation) and thinning of the cervix. They are sometimes difficult to tell apart from true labor because these contractions can be forceful and people have different pain tolerances. You should not feel embarrassed if you go to the hospital with false labor. Sometimes, the only way to tell if you are in true labor is for your health care provider to look for changes in the cervix. °If there are no prenatal problems or other health problems associated with the pregnancy, it is completely safe to be sent home with false labor and await the onset of true labor. °HOW CAN YOU TELL THE DIFFERENCE BETWEEN TRUE AND FALSE LABOR? °False Labor °· The contractions of false labor are usually shorter and not as hard as those of true labor.   °· The contractions are usually irregular.   °· The contractions are often felt in the front of the lower abdomen and in the groin.   °· The contractions may go away when you walk around or change positions while lying down.   °· The contractions get weaker and are shorter lasting as time goes on.   °· The contractions do not usually become progressively stronger, regular, and closer together as with true labor.   °True Labor °· Contractions in true labor last 30-70 seconds, become very regular, usually become more intense, and increase in frequency.   °· The contractions do not go away with walking.   °· The discomfort is usually felt in the top of the uterus and spreads to the lower abdomen and low back.   °· True labor can be  determined by your health care provider with an exam. This will show that the cervix is dilating and getting thinner.   °WHAT TO REMEMBER °· Keep up with your usual exercises and follow other instructions given by your health care provider.   °· Take medicines as directed by your health care provider.   °· Keep your regular prenatal appointments.   °· Eat and drink lightly if you think you are going into labor.   °· If Braxton Hicks contractions are making you uncomfortable:   °¨ Change your position from lying down or resting to walking, or from walking to resting.   °¨ Sit and rest in a tub of warm water.   °¨ Drink 2-3 glasses of water. Dehydration may cause these contractions.   °¨ Do slow and deep breathing several times an hour.   °WHEN SHOULD I SEEK IMMEDIATE MEDICAL CARE? °Seek immediate medical care if: °· Your contractions become stronger, more regular, and closer together.   °· You have fluid leaking or gushing from your vagina.   °· You have a fever.   °· You pass blood-tinged mucus.   °· You have vaginal bleeding.   °· You have continuous abdominal pain.   °· You have low back pain that you never had before.   °· You feel your baby's head pushing down and causing pelvic pressure.   °· Your baby is not moving as much as it used to.   °  °This information is not intended to replace advice given to you by your health care provider. Make sure you discuss any questions you have with your health care   provider. °  °Document Released: 02/22/2005 Document Revised: 02/27/2013 Document Reviewed: 12/04/2012 °Elsevier Interactive Patient Education ©2016 Elsevier Inc. ° °

## 2015-01-09 NOTE — MAU Provider Note (Signed)
History   [id number]   Chief Complaint  Patient presents with  . Contractions    HPI [name] is a 24 y.o. female  [phone] at [redacted]w[redacted]d IUP here with report of contractions that started this afternoon after intercourse.  Also reports a watery blood tinged discharge.  Contractions reported every 5 minutes per patient.  +fetal movement.  Pt also states having intermittent pain in right hip/leg with movement.  Denies numbness or limited mobility.    Patient's last menstrual period was 07/03/2014 (exact date).  OB History  Gravida Para Term Preterm AB SAB TAB Ectopic Multiple Living     # Outcome Date GA Lbr Len/2nd Weight Sex Delivery Anes PTL Lv  4 Current           3 Preterm 09/11/13 [redacted]w[redacted]d 185:01 / 00:04 4 lb 5.8 oz (1.98 kg) M Vag-Spont None  Y  2 Preterm 11/24/12 [redacted]w[redacted]d 04:55 / 00:45 3 lb 15.9 oz (1.81 kg) F Vag-Spont None  Y  1 SAB  [redacted]w[redacted]d      N      Comments: PPROM at 19 weeks 3 days; ??incomop cervix-did not labor at home, came in with membranes ruptured.      Past Medical History  Diagnosis Date  . No pertinent past medical history   . UTI (lower urinary tract infection)   . Anemia   . Preterm delivery   . Stillbirth     History reviewed. No pertinent family history.  Social History   Social History  . Marital Status: Single    Spouse Name: N/A  . Number of Children: N/A  . Years of Education: N/A   Social History Main Topics  . Smoking status: Never Smoker   . Smokeless tobacco: Never Used  . Alcohol Use: No  . Drug Use: No  . Sexual Activity: Yes    Birth Control/ Protection: None     Comment: Depo 08/04/2010   Other Topics Concern  . None   Social History Narrative    Allergies  Allergen Reactions  . Cherry Hives and Itching    No current facility-administered medications on file prior to encounter.   Current Outpatient Prescriptions on File Prior to Encounter  Medication Sig Dispense Refill  . acetaminophen (TYLENOL)  325 MG tablet Take 325 mg by mouth every 6 (six) hours as needed for headache.     . hydroxyprogesterone caproate (DELALUTIN) 250 mg/mL OIL injection Inject 250 mg into the muscle every 7 (seven) days. On Thursdays For 14 doses starting on 11-28-14    . NIFEdipine (PROCARDIA-XL/ADALAT CC) 30 MG 24 hr tablet Take 1 tablet (30 mg total) by mouth daily. Can take twice a day if needed to control symptoms 60 tablet 3  . Prenat-FeFum-FePo-FA-Omega 3 (CONCEPT DHA) 53.5-38-1 MG CAPS Take 1 tablet by mouth daily. 30 capsule 2     Review of Systems  Gastrointestinal: Positive for abdominal pain.  Genitourinary: Positive for vaginal bleeding and vaginal discharge.  Musculoskeletal:       Right leg/hip pain  All other systems reviewed and are negative.    Physical Exam   Filed Vitals:   01/09/15 1906  BP: 93/56  Pulse: 96  Temp: 98.3 F (36.8 C)  TempSrc: Oral  Resp: 18    Physical Exam  Constitutional: She is oriented to person, place, and time. She appears well-developed and well-nourished.  HENT:  Head: Normocephalic.  Neck: Normal range of motion.  Neck supple.  Cardiovascular: Normal rate, regular rhythm and normal heart sounds.   Respiratory: Effort normal and breath sounds normal. No respiratory distress.  GI: Soft. There is no tenderness.  Genitourinary:    No bleeding in the vagina. Vaginal discharge (mucusy) found.  Neg pooling, neg fern  Musculoskeletal: Normal range of motion. She exhibits no edema.       Right hip: She exhibits normal range of motion and no swelling.  Normal gait  Neurological: She is alert and oriented to person, place, and time.  Skin: Skin is warm and dry.   Dilation: Fingertip Effacement (%): Thick Cervical Position: Posterior Station: Ballotable Exam by:: Margarita MailW. [name], CNM  FHR 120's, +accels, Toco irritablity  2040 Cervical recheck > no change MAU Course  Procedures  MDM Results for orders placed or performed during the hospital  encounter of 01/09/15 (from the past 24 hour(s))  Urinalysis, Routine w reflex microscopic (not at [facility])     Status: Abnormal   Collection Time: 01/09/15  6:59 PM  Result Value Ref Range   Color, Urine YELLOW YELLOW   APPearance CLEAR CLEAR   Specific Gravity, Urine 1.020 1.005 - 1.030   pH 6.5 5.0 - 8.0   Glucose, UA 100 (A) NEGATIVE mg/dL   Hgb urine dipstick LARGE (A) NEGATIVE   Bilirubin Urine NEGATIVE NEGATIVE   Ketones, ur NEGATIVE NEGATIVE mg/dL   Protein, ur NEGATIVE NEGATIVE mg/dL   Urobilinogen, UA 1.0 0.0 - 1.0 mg/dL   Nitrite NEGATIVE NEGATIVE   Leukocytes, UA MODERATE (A) NEGATIVE  Urine microscopic-add on     Status: Abnormal   Collection Time: 01/09/15  6:59 PM  Result Value Ref Range   Squamous Epithelial / LPF FEW (A) RARE   WBC, UA 7-10 <3 WBC/hpf   RBC / HPF 3-6 <3 RBC/hpf   Bacteria, UA RARE RARE   Urine-Other MUCOUS PRESENT   Amnisure rupture of membrane (rom)[facility]     Status: None   Collection Time: 01/09/15  7:49 PM  Result Value Ref Range   Amnisure ROM NEGATIVE      Assessment and Plan  24 y.o. [id number] at [redacted]w[redacted]d IUP  Braxton Hicks Vaginal Discharge Bacteria in Urine  Plan: Discharge to home Reviewed preterm labor precautions Urine culture  Missed OB appt today > plans to go in AM for 17p Keep scheduled appt  Mackenzie EdelsonWalidah N [name], CNM 01/09/2015 8:28 PM

## 2015-01-09 NOTE — Telephone Encounter (Signed)
Left message on patient voicemail that I've tried to reach her via telephone about her appointment that was for today. She was scheduled originally at 10:25, and time was changed to 8:45. However, because you missed that 8:45 time, I have rescheduled your appointment to next week for an injection.

## 2015-01-09 NOTE — MAU Note (Signed)
Pt complains of contractions that have been going on for awhile but got stronger around 1200.  Pt complains of leg pain in her right leg that feels like her leg is constantly going to sleep.  Pt states she is feeling the baby move.  Pt states she is having vaginal bleeding that fills the middle of a regular pad every two hours.  Pt states she is drinking 4 water bottles per day.

## 2015-01-11 LAB — URINE CULTURE: Culture: NO GROWTH

## 2015-01-13 ENCOUNTER — Encounter: Payer: Self-pay | Admitting: Obstetrics & Gynecology

## 2015-01-16 ENCOUNTER — Ambulatory Visit: Payer: Medicaid Other

## 2015-01-17 ENCOUNTER — Encounter: Payer: Self-pay | Admitting: Obstetrics & Gynecology

## 2015-01-17 ENCOUNTER — Ambulatory Visit: Payer: Medicaid Other

## 2015-01-19 ENCOUNTER — Encounter: Payer: Self-pay | Admitting: Obstetrics & Gynecology

## 2015-01-23 ENCOUNTER — Encounter: Payer: Medicaid Other | Admitting: Obstetrics & Gynecology

## 2015-01-23 ENCOUNTER — Encounter: Payer: Self-pay | Admitting: Obstetrics & Gynecology

## 2015-01-28 ENCOUNTER — Encounter: Payer: Self-pay | Admitting: Obstetrics & Gynecology

## 2015-02-03 ENCOUNTER — Inpatient Hospital Stay (HOSPITAL_COMMUNITY)
Admission: AD | Admit: 2015-02-03 | Discharge: 2015-02-05 | DRG: 778 | Disposition: A | Discharge status: Home / Self Care | Payer: Medicaid Other | Source: Ambulatory Visit | Attending: Family Medicine | Admitting: Family Medicine

## 2015-02-03 ENCOUNTER — Encounter (HOSPITAL_COMMUNITY): Payer: Self-pay | Admitting: *Deleted

## 2015-02-03 DIAGNOSIS — O0933 Supervision of pregnancy with insufficient antenatal care, third trimester: Secondary | ICD-10-CM

## 2015-02-03 DIAGNOSIS — Z2839 Other underimmunization status: Secondary | ICD-10-CM

## 2015-02-03 DIAGNOSIS — Z283 Underimmunization status: Secondary | ICD-10-CM

## 2015-02-03 DIAGNOSIS — O2342 Unspecified infection of urinary tract in pregnancy, second trimester: Secondary | ICD-10-CM

## 2015-02-03 DIAGNOSIS — O9989 Other specified diseases and conditions complicating pregnancy, childbirth and the puerperium: Secondary | ICD-10-CM

## 2015-02-03 DIAGNOSIS — Z3A33 33 weeks gestation of pregnancy: Secondary | ICD-10-CM

## 2015-02-03 DIAGNOSIS — O99820 Streptococcus B carrier state complicating pregnancy: Secondary | ICD-10-CM

## 2015-02-03 NOTE — MAU Note (Signed)
G4P2 at 34 wks with hx of preterm labor c/o contractions that started at 2200 tonight and now ap[prox 5 min apart.  Denies any vag leaking or bleeding.  Reports good fetal movement, received beta methasone shots in October .

## 2015-02-04 ENCOUNTER — Encounter (HOSPITAL_COMMUNITY): Payer: Self-pay | Admitting: *Deleted

## 2015-02-04 DIAGNOSIS — O0933 Supervision of pregnancy with insufficient antenatal care, third trimester: Secondary | ICD-10-CM | POA: Diagnosis not present

## 2015-02-04 DIAGNOSIS — Z3A33 33 weeks gestation of pregnancy: Secondary | ICD-10-CM | POA: Diagnosis not present

## 2015-02-04 LAB — URINALYSIS, ROUTINE W REFLEX MICROSCOPIC
BILIRUBIN URINE: NEGATIVE
GLUCOSE, UA: NEGATIVE mg/dL
HGB URINE DIPSTICK: NEGATIVE
KETONES UR: NEGATIVE mg/dL
Leukocytes, UA: NEGATIVE
NITRITE: NEGATIVE
PH: 6 (ref 5.0–8.0)
Protein, ur: NEGATIVE mg/dL

## 2015-02-04 LAB — CBC
HCT: 27.9 % — ABNORMAL LOW (ref 36.0–46.0)
HEMOGLOBIN: 9 g/dL — AB (ref 12.0–15.0)
MCH: 25.6 pg — AB (ref 26.0–34.0)
MCHC: 32.3 g/dL (ref 30.0–36.0)
MCV: 79.5 fL (ref 78.0–100.0)
Platelets: 231 10*3/uL (ref 150–400)
RBC: 3.51 MIL/uL — AB (ref 3.87–5.11)
RDW: 14.4 % (ref 11.5–15.5)
WBC: 10.2 10*3/uL (ref 4.0–10.5)

## 2015-02-04 LAB — CULTURE, BETA STREP (GROUP B ONLY)

## 2015-02-04 LAB — RPR: RPR Ser Ql: NONREACTIVE

## 2015-02-04 LAB — GC/CHLAMYDIA PROBE AMP (~~LOC~~) NOT AT ARMC
Chlamydia: NEGATIVE
NEISSERIA GONORRHEA: NEGATIVE

## 2015-02-04 LAB — WET PREP, GENITAL
Clue Cells Wet Prep HPF POC: NONE SEEN
SPERM: NONE SEEN
Trich, Wet Prep: NONE SEEN
Yeast Wet Prep HPF POC: NONE SEEN

## 2015-02-04 LAB — TYPE AND SCREEN
ABO/RH(D): AB POS
Antibody Screen: NEGATIVE

## 2015-02-04 LAB — OB RESULTS CONSOLE GBS: GBS: POSITIVE

## 2015-02-04 MED ORDER — MAGNESIUM SULFATE 50 % IJ SOLN
2.0000 g/h | INTRAVENOUS | Status: DC
  Administered 2015-02-04: 2 g/h via INTRAVENOUS
  Filled 2015-02-04 (×2): qty 80

## 2015-02-04 MED ORDER — LACTATED RINGERS IV SOLN: 500.0000 mL | INTRAVENOUS | Status: DC | PRN

## 2015-02-04 MED ORDER — PENICILLIN G POTASSIUM 5000000 UNITS IJ SOLR
5.0000 10*6.[IU] | Freq: Once | INTRAMUSCULAR | Status: AC
  Administered 2015-02-04: 5 10*6.[IU] via INTRAVENOUS
  Filled 2015-02-04: qty 5

## 2015-02-04 MED ORDER — LACTATED RINGERS IV SOLN
INTRAVENOUS | Status: DC
  Administered 2015-02-04 – 2015-02-05 (×3): via INTRAVENOUS

## 2015-02-04 MED ORDER — LIDOCAINE HCL (PF) 1 % IJ SOLN: 30.0000 mL | INTRAMUSCULAR | Status: DC | PRN

## 2015-02-04 MED ORDER — OXYTOCIN 40 UNITS IN LACTATED RINGERS INFUSION - SIMPLE MED: 62.5000 mL/h | INTRAVENOUS | Status: DC

## 2015-02-04 MED ORDER — ACETAMINOPHEN 325 MG PO TABS
650.0000 mg | ORAL_TABLET | ORAL | Status: DC | PRN
  Administered 2015-02-04: 650 mg via ORAL
  Filled 2015-02-04: qty 2

## 2015-02-04 MED ORDER — PENICILLIN G POTASSIUM 5000000 UNITS IJ SOLR
2.5000 10*6.[IU] | INTRAMUSCULAR | Status: DC
  Administered 2015-02-04 – 2015-02-05 (×8): 2.5 10*6.[IU] via INTRAVENOUS
  Filled 2015-02-04 (×12): qty 2.5

## 2015-02-04 MED ORDER — BUTORPHANOL TARTRATE 1 MG/ML IJ SOLN
1.0000 mg | Freq: Once | INTRAMUSCULAR | Status: AC
  Administered 2015-02-04: 1 mg via INTRAVENOUS
  Filled 2015-02-04: qty 1

## 2015-02-04 MED ORDER — BETAMETHASONE SOD PHOS & ACET 6 (3-3) MG/ML IJ SUSP
12.0000 mg | Freq: Once | INTRAMUSCULAR | Status: AC
  Administered 2015-02-04: 12 mg via INTRAMUSCULAR
  Filled 2015-02-04: qty 2

## 2015-02-04 MED ORDER — MAGNESIUM SULFATE BOLUS VIA INFUSION
6.0000 g | Freq: Once | INTRAVENOUS | Status: AC
  Administered 2015-02-04: 6 g via INTRAVENOUS
  Filled 2015-02-04: qty 500

## 2015-02-04 MED ORDER — ONDANSETRON HCL 4 MG/2ML IJ SOLN: 4.0000 mg | Freq: Four times a day (QID) | INTRAMUSCULAR | Status: DC | PRN

## 2015-02-04 MED ORDER — BETAMETHASONE SOD PHOS & ACET 6 (3-3) MG/ML IJ SUSP
12.0000 mg | Freq: Once | INTRAMUSCULAR | Status: AC
  Administered 2015-02-05: 12 mg via INTRAMUSCULAR
  Filled 2015-02-04: qty 2

## 2015-02-04 MED ORDER — OXYTOCIN BOLUS FROM INFUSION: 500.0000 mL | INTRAVENOUS | Status: DC

## 2015-02-04 MED ORDER — CITRIC ACID-SODIUM CITRATE 334-500 MG/5ML PO SOLN: 30.0000 mL | ORAL | Status: DC | PRN

## 2015-02-04 MED ORDER — LACTATED RINGERS IV BOLUS (SEPSIS)
1000.0000 mL | Freq: Once | INTRAVENOUS | Status: AC
  Administered 2015-02-04 (×2): 1000 mL via INTRAVENOUS

## 2015-02-04 MED ORDER — NIFEDIPINE 10 MG PO CAPS
20.0000 mg | ORAL_CAPSULE | Freq: Once | ORAL | Status: AC
  Administered 2015-02-04: 20 mg via ORAL
  Filled 2015-02-04: qty 2

## 2015-02-04 MED ORDER — TERBUTALINE SULFATE 1 MG/ML IJ SOLN
0.2500 mg | Freq: Once | INTRAMUSCULAR | Status: AC
  Administered 2015-02-04: 0.25 mg via SUBCUTANEOUS
  Filled 2015-02-04: qty 1

## 2015-02-04 NOTE — Progress Notes (Addendum)
Patient ID: Shayma Pfefferle, female   DOB: 1990-04-10, 24 y.o.   MRN: [id number]  FACULTY PRACTICE ANTEPARTUM NOTE  Deliah Strehlow is a 24 y.o. [id number] at [redacted]w[redacted]d who is admitted for Preterm labor.   Fetal presentation is cephalic. Length of Stay:  0  Days  Subjective: Patient still feels contractions.  The contractions have spaced out with magnesium, now feels them mild to moderately every 8 minutes or so.  Was on Makena, but last dose was given on 10/21 Patient reports good fetal movement.   She reports no bleeding  She reports no loss of fluid per vagina.  Vitals:  Blood pressure 102/60, pulse 91, temperature 98.3 F (36.8 C), temperature source Oral, resp. rate 18, height _0  (1.6 m), weight 120 lb (54.432 kg), last menstrual period 07/03/2014, SpO2 100 %, not currently breastfeeding. Physical Examination:  General appearance - alert, well appearing, and in no distress Chest - clear to auscultation, no wheezes, rales or rhonchi, symmetric air entry Heart - normal rate, regular rhythm, normal S1, S2, no murmurs, rubs, clicks or gallops Abdomen - soft, nontender, nondistended, no masses or organomegaly Fundal Height:  size equals dates Extremities: extremities normal, atraumatic, no cyanosis or edema with DTRs 2+ bilaterally Membranes:intact  Fetal Monitoring:  Baseline: 120 bpm, Variability: Good {> 6 bpm), Accelerations: Reactive and Decelerations: Variable: moderate  Labs:  Results for orders placed or performed during the hospital encounter of 02/03/15 (from the past 24 hour(s))  Wet prep, genital   Collection Time: 02/04/15 12:25 AM  Result Value Ref Range   Yeast Wet Prep HPF POC NONE SEEN NONE SEEN   Trich, Wet Prep NONE SEEN NONE SEEN   Clue Cells Wet Prep HPF POC NONE SEEN NONE SEEN   WBC, Wet Prep HPF POC FEW (A) NONE SEEN   Sperm NONE SEEN   [facility], [facility]   Collection Time: 02/04/15 12:25 AM  Result Value Ref Range   Color,  Urine YELLOW YELLOW   APPearance CLEAR CLEAR   Specific Gravity, Urine <1.005 (L) 1.005 - 1.030   pH 6.0 5.0 - 8.0   Glucose, UA NEGATIVE NEGATIVE mg/dL   Hgb urine dipstick NEGATIVE NEGATIVE   Bilirubin Urine NEGATIVE NEGATIVE   Ketones, ur NEGATIVE NEGATIVE mg/dL   Protein, ur NEGATIVE NEGATIVE mg/dL   Nitrite NEGATIVE NEGATIVE   Leukocytes, UA NEGATIVE NEGATIVE  CBC   Collection Time: 02/04/15  3:30 AM  Result Value Ref Range   WBC 10.2 4.0 - 10.5 K/uL   RBC 3.51 (L) 3.87 - 5.11 MIL/uL   Hemoglobin 9.0 (L) 12.0 - 15.0 g/dL   HCT 27.9 (L) 36.0 - 46.0 %   MCV 79.5 78.0 - 100.0 fL   MCH 25.6 (L) 26.0 - 34.0 pg   MCHC 32.3 30.0 - 36.0 g/dL   RDW 14.4 11.5 - 15.5 %   Platelets 231 150 - 400 K/uL  Type and screen   Collection Time: 02/04/15  3:30 AM  Result Value Ref Range   ABO/RH(D) AB POS    Antibody Screen NEG    Sample Expiration 02/07/2015     Imaging Studies:       Medications:  Scheduled . [START ON 02/05/2015] betamethasone acetate-betamethasone sodium phosphate  12 mg Intramuscular Once  . pencillin G potassium IV  2.5 Million Units Intravenous Q4H  . terbutaline  0.25 mg Subcutaneous Once   I have reviewed the patient's current medications.  ASSESSMENT: Patient Active Problem List  Diagnosis Date Noted  . Vaginal wall cyst 01/02/2015  . Preterm labor 12/11/2014  . Rubella non-immune status, antepartum 10/02/2014  . UTI (urinary tract infection) during pregnancy 09/30/2014  . Vaginal Pap smear with ASC-US 10/18/2013  . Preterm spontaneous labor with preterm delivery 09/11/2013  . Pregnancy 09/04/2013  . History of preterm premature rupture of membranes (PROM) in previous pregnancy, currently pregnant in second trimester 09/25/2012  . Supervision of high risk pregnancy in second trimester 09/25/2012    PLAN: 1.  Preterm Labor  Continue magnesium  Second dose of rescue steroids due tonight around midnight  Will give terbutaline to eliminate  contractions.  GBS still pending, continue penicillin.    Wet prep normal - GC/CT still pending.  Continuous monitoring for now. 2.  History of preterm delivery  Will see about given Makena here. 3.  Insufficient prenatal care  Has missed 28 week labs - will need 1hr GTT at some point Continue routine antenatal care.   Truett Mainland, DO 02/04/2015,6:33 AM    Addendum 02/04/2015 9:18 AM I met patient this morning and reiterated the plan as outlined by Dr. Nehemiah Settle above.  She desired regular diet, this was ordered for her.  Will continue plan as above.   Verita Schneiders, MD, [place] Attending Obstetrician & Gynecologist, [place], [place] for [employer]

## 2015-02-04 NOTE — MAU Provider Note (Signed)
LABOR AND DELIVERY ADMISSION HISTORY AND PHYSICAL NOTE  [name] is a 24 y.o. female [phone] with IUP at 6442w1d by 16 wk u/s  presenting for painful contractions every few minutes beginning 10 pm this evening. She reports +FMs, No LOF, no VB, no blurry vision, headaches or peripheral edema, and RUQ pain.     Prenatal History/Complications:  Past Medical History: Past Medical History  Diagnosis Date  . No pertinent past medical history   . UTI (lower urinary tract infection)   . Anemia   . Preterm delivery   . Stillbirth     Past Surgical History: Past Surgical History  Procedure Laterality Date  . No past surgeries      Obstetrical History: OB History    Gravida Para Term Preterm AB TAB SAB Ectopic Multiple Living   4 2 0 2 1 0 1 0 0 2       Social History: Social History   Social History  . Marital Status: Single    Spouse Name: N/A  . Number of Children: N/A  . Years of Education: N/A   Social History Main Topics  . Smoking status: Never Smoker   . Smokeless tobacco: Never Used  . Alcohol Use: No  . Drug Use: No  . Sexual Activity: Yes    Birth Control/ Protection: None     Comment: last intercourse Feb 03 2015   Other Topics Concern  . None   Social History Narrative    Family History: History reviewed. No pertinent family history.  Allergies: Allergies  Allergen Reactions  . Cherry Hives and Itching    Facility-administered medications prior to admission  Medication Dose Route Frequency Provider Last Rate Last Dose  . hydroxyprogesterone caproate (DELALUTIN) 250 mg/mL injection 250 mg  250 mg Intramuscular Weekly Peggy Constant, MD   250 mg at 12/27/14 1038   Prescriptions prior to admission  Medication Sig Dispense Refill Last Dose  . acetaminophen (TYLENOL) 325 MG tablet Take 325 mg by mouth every 6 (six) hours as needed for headache.    02/02/2015 at Unknown time  . hydroxyprogesterone caproate (DELALUTIN) 250 mg/mL OIL injection  Inject 250 mg into the muscle every 7 (seven) days. On Thursdays For 14 doses starting on 11-28-14   Past Week at Unknown time  . NIFEdipine (PROCARDIA-XL/ADALAT CC) 30 MG 24 hr tablet Take 1 tablet (30 mg total) by mouth daily. Can take twice a day if needed to control symptoms 60 tablet 3 02/02/2015 at Unknown time  . Prenat-FeFum-FePo-FA-Omega 3 (CONCEPT DHA) 53.5-38-1 MG CAPS Take 1 tablet by mouth daily. 30 capsule 2 02/02/2015 at Unknown time     Review of Systems   All systems reviewed and negative except as stated in HPI  Blood pressure 112/67, pulse 103, temperature 99 F (37.2 C), temperature source Oral, resp. rate 16, height 5\' 3"  (1.6 m), weight 120 lb (54.432 kg), last menstrual period 07/03/2014, not currently breastfeeding. General appearance: alert, cooperative, appears stated age and moderate distress Lungs: clear to auscultation bilaterally Heart: regular rate and rhythm Abdomen: soft, non-tender; bowel sounds normal Extremities: No calf swelling or tenderness Presentation: cephalic Fetal monitoring: 140/mod/+a/-d Uterine activity: q 2-3 min  Dilation: 3 Effacement (%): 60 Station: -3 Exam by:: Elie ConferK. Weiss RN   Prenatal labs: ABO, Rh: --/--/AB POS (10/05 1900) Antibody: NEG (10/05 1900) Rubella: !Error!not immune RPR: NON REAC (07/21 1607)  HBsAg: NEGATIVE (07/21 1607)  HIV: Non Reactive (06/29 1300)  GBS:   unknown 1 hr Glucola  not performed (limited pnc) Genetic screening  Not perfromed Anatomy US wnl  Prenatal Transfer Tool  Maternal Diabetes: unknown Genetic Screening: Declined Maternal Ultrasounds/Referrals: Normal Fetal Ultrasounds or other Referrals:  None Maternal Substance Abuse:  No Significant Maternal Medications:  Meds include: Other: 17-p Significant Maternal Lab Results: Lab values include: Other: gbs unknown  Results for orders placed or performed during the hospital encounter of 02/03/15 (from the past 24 hour(s))  Wet prep, genital    Collection Time: 02/04/15 12:25 AM  Result Value Ref Range   Yeast Wet Prep HPF POC NONE SEEN NONE SEEN   Trich, Wet Prep NONE SEEN NONE SEEN   Clue Cells Wet Prep HPF POC NONE SEEN NONE SEEN   WBC, Wet Prep HPF POC FEW (A) NONE SEEN   Sperm NONE SEEN     Patient Active Problem List   Diagnosis Date Noted  . Vaginal wall cyst 01/02/2015  . Preterm labor 12/11/2014  . Rubella non-immune status, antepartum 10/02/2014  . UTI (urinary tract infection) during pregnancy 09/30/2014  . Vaginal Pap smear with ASC-US 10/18/2013  . Preterm spontaneous labor with preterm delivery 09/11/2013  . Pregnancy 09/04/2013  . History of preterm premature rupture of membranes (PROM) in previous pregnancy, currently pregnant in second trimester 09/25/2012  . Supervision of high risk pregnancy in second trimester 09/25/2012    Assessment: [name] is a 24 y.o. [id number] at [redacted]w[redacted]d here for preterm labor.  #Labor:fingertip 4 weeks ago, 2.5 cm dilated on presentation, and 3 cm dilated 2 hours later. Nifedipine and IV fluids not effective, will start magnesium for tocolysis. Is s/p bmz x2 in early October, have given first rescue dose and will plan to give 2nd dose in 24 hours. Wet prep wnl; f/u urinalysis and gonorrhea/chlamydia.  #Pain: IV narcotics for now #FWB: Cat 1 #ID:  gbs unknown, starting pcn. gbs culture ordered #Rubella not immune: vaccinate PP  Luster Hechler B Terence Bart 02/04/2015, 1:59 AM

## 2015-02-04 NOTE — Progress Notes (Signed)
I attempted a visit two times, but pt was not feeling well this morning and was resting this afternoon.  We will attempt to follow up tomorrow.  Chaplain Dyanne CarrelKaty Brenn Deziel, Bcc Pager, [phone] 3:55 PM    02/04/15 1500  Clinical Encounter Type  Visited With Patient

## 2015-02-05 DIAGNOSIS — O99820 Streptococcus B carrier state complicating pregnancy: Secondary | ICD-10-CM

## 2015-02-05 NOTE — Discharge Summary (Signed)
Physician Discharge Summary  Patient ID: Mackenzie SpatzQuameca Lecount MRN: [id number] DOB/AGE: 22/03/1990 22 y.o.  Admit date: 02/03/2015 Discharge date: 02/05/2015  Admission Diagnoses:4629w2d preterm labor   Discharge Diagnoses: same Active Problems:   Preterm labor   Group B Streptococcus carrier, +RV culture, currently pregnant   Discharged Condition: good  Hospital Course:  LABOR AND DELIVERY ADMISSION HISTORY AND PHYSICAL NOTE  [name] is a 24 y.o. female [phone] with IUP at 6233w1d by 16 wk u/s presenting for painful contractions every few minutes beginning 10 pm this evening. She reports +FMs, No LOF, no VB, no blurry vision, headaches or peripheral edema, and RUQ pain.    Prenatal History/Complications:  Past Medical History: Past Medical History  Diagnosis Date  . No pertinent past medical history   . UTI (lower urinary tract infection)   . Anemia   . Preterm delivery   . Stillbirth     Past Surgical History: Past Surgical History  Procedure Laterality Date  . No past surgeries      Obstetrical History: OB History    Gravida Para Term Preterm AB TAB SAB Ectopic Multiple Living   4 2 0 2 1 0 1 0 0 2       Social History: Social History   Social History  . Marital Status: Single    Spouse Name: N/A  . Number of Children: N/A  . Years of Education: N/A   Social History Main Topics  . Smoking status: Never Smoker   . Smokeless tobacco: Never Used  . Alcohol Use: No  . Drug Use: No  . Sexual Activity: Yes    Birth Control/ Protection: None     Comment: last intercourse Feb 03 2015   Other Topics Concern  . None   Social History Narrative    Family History: History reviewed. No pertinent family history.  Allergies: Allergies  Allergen Reactions   . Cherry Hives and Itching    Facility-administered medications prior to admission  Medication Dose Route Frequency Provider Last Rate Last Dose  . hydroxyprogesterone caproate (DELALUTIN) 250 mg/mL injection 250 mg 250 mg Intramuscular Weekly Peggy Constant, MD  250 mg at 12/27/14 1038   Prescriptions prior to admission  Medication Sig Dispense Refill Last Dose  . acetaminophen (TYLENOL) 325 MG tablet Take 325 mg by mouth every 6 (six) hours as needed for headache.    02/02/2015 at Unknown time  . hydroxyprogesterone caproate (DELALUTIN) 250 mg/mL OIL injection Inject 250 mg into the muscle every 7 (seven) days. On Thursdays For 14 doses starting on 11-28-14   Past Week at Unknown time  . NIFEdipine (PROCARDIA-XL/ADALAT CC) 30 MG 24 hr tablet Take 1 tablet (30 mg total) by mouth daily. Can take twice a day if needed to control symptoms 60 tablet 3 02/02/2015 at Unknown time  . Prenat-FeFum-FePo-FA-Omega 3 (CONCEPT DHA) 53.5-38-1 MG CAPS Take 1 tablet by mouth daily. 30 capsule 2 02/02/2015 at Unknown time     Review of Systems   All systems reviewed and negative except as stated in HPI  Blood pressure 112/67, pulse 103, temperature 99 F (37.2 C), temperature source Oral, resp. rate 16, height 5\' 3"  (1.6 m), weight 120 lb (54.432 kg), last menstrual period 07/03/2014, not currently breastfeeding. General appearance: alert, cooperative, appears stated age and moderate distress Lungs: clear to auscultation bilaterally Heart: regular rate and rhythm Abdomen: soft, non-tender; bowel sounds normal Extremities: No calf swelling or tenderness Presentation: cephalic Fetal monitoring: 140/mod/+a/-d Uterine activity: q 2-3 min  Dilation:  3 Effacement (%): 60 Station: -3 Exam by:: Elie Confer RN   Prenatal labs: ABO, Rh: --/--/AB POS (10/05 1900) Antibody: NEG (10/05 1900) Rubella:  !Error!not immune RPR: NON REAC (07/21 1607)  HBsAg: NEGATIVE (07/21 1607)  HIV: Non Reactive (06/29 1300)  GBS: unknown 1 hr Glucola not performed (limited pnc) Genetic screening Not perfromed Anatomy US wnl  Prenatal Transfer Tool  Maternal Diabetes: unknown Genetic Screening: Declined Maternal Ultrasounds/Referrals: Normal Fetal Ultrasounds or other Referrals: None Maternal Substance Abuse: No Significant Maternal Medications: Meds include: Other: 17-p Significant Maternal Lab Results: Lab values include: Other: gbs unknown  Results for orders placed or performed during the hospital encounter of 02/03/15 (from the past 24 hour(s))  Wet prep, genital   Collection Time: 02/04/15 12:25 AM  Result Value Ref Range   Yeast Wet Prep HPF POC NONE SEEN NONE SEEN   Trich, Wet Prep NONE SEEN NONE SEEN   Clue Cells Wet Prep HPF POC NONE SEEN NONE SEEN   WBC, Wet Prep HPF POC FEW (A) NONE SEEN   Sperm NONE SEEN     Patient Active Problem List   Diagnosis Date Noted  . Vaginal wall cyst 01/02/2015  . Preterm labor 12/11/2014  . Rubella non-immune status, antepartum 10/02/2014  . UTI (urinary tract infection) during pregnancy 09/30/2014  . Vaginal Pap smear with ASC-US 10/18/2013  . Preterm spontaneous labor with preterm delivery 09/11/2013  . Pregnancy 09/04/2013  . History of preterm premature rupture of membranes (PROM) in previous pregnancy, currently pregnant in second trimester 09/25/2012  . Supervision of high risk pregnancy in second trimester 09/25/2012    Assessment: [name] is a 24 y.o. [id number] at [redacted]w[redacted]d here for preterm labor.  #Labor:fingertip 4 weeks ago, 2.5 cm dilated on presentation, and 3 cm dilated 2 hours later. Nifedipine and IV fluids not effective, will start magnesium for tocolysis. Is s/p bmz x2 in early October, have  given first rescue dose and will plan to give 2nd dose in 24 hours. Wet prep wnl; f/u urinalysis and gonorrhea/chlamydia.  #Pain:IV narcotics for now #FWB:Cat 1 #ID: gbs unknown, starting pcn. gbs culture ordered #Rubella not immune: vaccinate PP  [name] 02/04/2015, 1:59 AM           Consults: None  Significant Diagnostic Studies:    Treatments: magnesium sulfate, betamethasone  Discharge Exam: Blood pressure 90/50, pulse 94, temperature 98 F (36.7 C), temperature source Oral, resp. rate 16, height  (1.6 m), weight 54.432 kg (120 lb), last menstrual period 07/03/2014, SpO2 100 %, not currently breastfeeding. General appearance: alert, cooperative and no distress Pelvic: external genitalia normal and no cervical motion tenderness cervix unchanged from admission  Disposition: 01-Home or Self Care     Medication List    TAKE these medications        acetaminophen 325 MG tablet  Commonly known as:  TYLENOL  Take 325 mg by mouth every 6 (six) hours as needed for headache.     CONCEPT DHA 53.5-38-1 MG Caps  Take 1 tablet by mouth daily.     hydroxyprogesterone caproate 250 mg/mL Oil injection  Commonly known as:  DELALUTIN  Inject 250 mg into the muscle every 7 (seven) days. On Thursdays For 14 doses starting on 11-28-14     NIFEdipine 30 MG 24 hr tablet  Commonly known as:  PROCARDIA-XL/ADALAT CC  Take 1 tablet (30 mg total) by mouth daily. Can take twice a day if needed to control symptoms  f/u tomorrow at [facility], 17 P injection  Signed: Daryl Quiros 02/05/2015, 2:15 PM

## 2015-02-05 NOTE — H&P (Signed)
Attestation signed by Mackenzie HeritageJacob J Wali Reinheimer, DO at 02/04/2015 6:33 AM  Attestation of Attending Supervision of Obstetric Fellow: Evaluation and management procedures were performed by the Obstetric Fellow under my supervision and collaboration. I have reviewed the Obstetric Fellow's note and chart, and I agree with the management and plan.  Mackenzie CelesteJacob Citlalic Norlander, DO Attending Physician Faculty Practice, [facility]     Expand All Collapse All   LABOR AND DELIVERY ADMISSION HISTORY AND PHYSICAL NOTE  Mackenzie SpatzQuameca [name] is a 24 y.o. female [phone] with IUP at 2832w1d by 16 wk u/s presenting for painful contractions every few minutes beginning 10 pm this evening. She reports +FMs, No LOF, no VB, no blurry vision, headaches or peripheral edema, and RUQ pain.    Prenatal History/Complications:  Past Medical History: Past Medical History  Diagnosis Date  . No pertinent past medical history   . UTI (lower urinary tract infection)   . Anemia   . Preterm delivery   . Stillbirth     Past Surgical History: Past Surgical History  Procedure Laterality Date  . No past surgeries      Obstetrical History: OB History    Gravida Para Term Preterm AB TAB SAB Ectopic Multiple Living   4 2 0 2 1 0 1 0 0 2       Social History: Social History   Social History  . Marital Status: Single    Spouse Name: N/A  . Number of Children: N/A  . Years of Education: N/A   Social History Main Topics  . Smoking status: Never Smoker   . Smokeless tobacco: Never Used  . Alcohol Use: No  . Drug Use: No  . Sexual Activity: Yes    Birth Control/ Protection: None     Comment: last intercourse Feb 03 2015   Other Topics Concern  . None   Social History Narrative    Family History: History reviewed. No pertinent family history.  Allergies: Allergies  Allergen Reactions  . Cherry  Hives and Itching    Facility-administered medications prior to admission  Medication Dose Route Frequency Provider Last Rate Last Dose  . hydroxyprogesterone caproate (DELALUTIN) 250 mg/mL injection 250 mg 250 mg Intramuscular Weekly Peggy Constant, MD  250 mg at 12/27/14 1038   Prescriptions prior to admission  Medication Sig Dispense Refill Last Dose  . acetaminophen (TYLENOL) 325 MG tablet Take 325 mg by mouth every 6 (six) hours as needed for headache.    02/02/2015 at Unknown time  . hydroxyprogesterone caproate (DELALUTIN) 250 mg/mL OIL injection Inject 250 mg into the muscle every 7 (seven) days. On Thursdays For 14 doses starting on 11-28-14   Past Week at Unknown time  . NIFEdipine (PROCARDIA-XL/ADALAT CC) 30 MG 24 hr tablet Take 1 tablet (30 mg total) by mouth daily. Can take twice a day if needed to control symptoms 60 tablet 3 02/02/2015 at Unknown time  . Prenat-FeFum-FePo-FA-Omega 3 (CONCEPT DHA) 53.5-38-1 MG CAPS Take 1 tablet by mouth daily. 30 capsule 2 02/02/2015 at Unknown time     Review of Systems   All systems reviewed and negative except as stated in HPI  Blood pressure 112/67, pulse 103, temperature 99 F (37.2 C), temperature source Oral, resp. rate 16, height 5\' 3"  (1.6 m), weight 120 lb (54.432 kg), last menstrual period 07/03/2014, not currently breastfeeding. General appearance: alert, cooperative, appears stated age and moderate distress Lungs: clear to auscultation bilaterally Heart: regular rate and rhythm Abdomen: soft, non-tender; bowel sounds normal Extremities:  No calf swelling or tenderness Presentation: cephalic Fetal monitoring: 140/mod/+a/-d Uterine activity: q 2-3 min  Dilation: 3 Effacement (%): 60 Station: -3 Exam by:: Elie Confer RN   Prenatal labs: ABO, Rh: --/--/AB POS (10/05 1900) Antibody: NEG (10/05 1900) Rubella: !Error!not immune RPR: NON REAC (07/21 1607)  HBsAg: NEGATIVE  (07/21 1607)  HIV: Non Reactive (06/29 1300)  GBS:   unknown 1 hr Glucola not performed (limited pnc) Genetic screening Not perfromed Anatomy US wnl  Prenatal Transfer Tool  Maternal Diabetes: unknown Genetic Screening: Declined Maternal Ultrasounds/Referrals: Normal Fetal Ultrasounds or other Referrals: None Maternal Substance Abuse: No Significant Maternal Medications: Meds include: Other: 17-p Significant Maternal Lab Results: Lab values include: Other: gbs unknown  Results for orders placed or performed during the hospital encounter of 02/03/15 (from the past 24 hour(s))  Wet prep, genital   Collection Time: 02/04/15 12:25 AM  Result Value Ref Range   Yeast Wet Prep HPF POC NONE SEEN NONE SEEN   Trich, Wet Prep NONE SEEN NONE SEEN   Clue Cells Wet Prep HPF POC NONE SEEN NONE SEEN   WBC, Wet Prep HPF POC FEW (A) NONE SEEN   Sperm NONE SEEN     Patient Active Problem List   Diagnosis Date Noted  . Vaginal wall cyst 01/02/2015  . Preterm labor 12/11/2014  . Rubella non-immune status, antepartum 10/02/2014  . UTI (urinary tract infection) during pregnancy 09/30/2014  . Vaginal Pap smear with ASC-US 10/18/2013  . Preterm spontaneous labor with preterm delivery 09/11/2013  . Pregnancy 09/04/2013  . History of preterm premature rupture of membranes (PROM) in previous pregnancy, currently pregnant in second trimester 09/25/2012  . Supervision of high risk pregnancy in second trimester 09/25/2012    Assessment: [name] is a 24 y.o. [name] at [redacted]w[redacted]d here for preterm labor.  #Labor:fingertip 4 weeks ago, 2.5 cm dilated on presentation, and 3 cm dilated 2 hours later. Nifedipine and IV fluids not effective, will start magnesium for tocolysis. Is s/p bmz x2 in early October, have given first rescue dose and will plan to give 2nd dose in 24 hours. Wet prep wnl; f/u urinalysis and gonorrhea/chlamydia.   #Pain:IV narcotics for now #FWB:Cat 1 #ID: gbs unknown, starting pcn. gbs culture ordered #Rubella not immune: vaccinate PP  [name] 02/04/2015, 1:59 AM

## 2015-02-05 NOTE — Progress Notes (Signed)
Reviewed D/C instructions with pt.  Copy of instructions given to pt. Keep appt with Clinic on 02/06/15 for NST.  Call Clinic with questions or concerns.

## 2015-02-05 NOTE — Progress Notes (Signed)
POC to D/C home.  Keep appt with the Clinic tomorrow.

## 2015-02-05 NOTE — Progress Notes (Signed)
Dr Arnold/Dr Horton FinerMickell updated on pt FHT/uterine activity.  No new orders noted.  Will continue with current POC.

## 2015-02-06 ENCOUNTER — Encounter: Payer: Medicaid Other | Admitting: Family Medicine

## 2015-02-06 ENCOUNTER — Ambulatory Visit: Payer: Medicaid Other

## 2015-02-08 ENCOUNTER — Inpatient Hospital Stay (HOSPITAL_COMMUNITY)
Admission: AD | Admit: 2015-02-08 | Discharge: 2015-02-08 | Disposition: A | Discharge status: Home / Self Care | Payer: Medicaid Other | Source: Ambulatory Visit | Attending: Obstetrics and Gynecology | Admitting: Obstetrics and Gynecology

## 2015-02-08 ENCOUNTER — Encounter (HOSPITAL_COMMUNITY): Payer: Self-pay

## 2015-02-08 DIAGNOSIS — O4703 False labor before 37 completed weeks of gestation, third trimester: Secondary | ICD-10-CM | POA: Diagnosis not present

## 2015-02-08 DIAGNOSIS — Z3A33 33 weeks gestation of pregnancy: Secondary | ICD-10-CM | POA: Diagnosis not present

## 2015-02-08 LAB — URINALYSIS, ROUTINE W REFLEX MICROSCOPIC
Bilirubin Urine: NEGATIVE
Glucose, UA: 250 mg/dL — AB
HGB URINE DIPSTICK: NEGATIVE
KETONES UR: NEGATIVE mg/dL
NITRITE: NEGATIVE
PROTEIN: NEGATIVE mg/dL
SPECIFIC GRAVITY, URINE: 1.01 (ref 1.005–1.030)
pH: 6.5 (ref 5.0–8.0)

## 2015-02-08 LAB — URINE MICROSCOPIC-ADD ON

## 2015-02-08 MED ORDER — OXYCODONE-ACETAMINOPHEN 5-325 MG PO TABS
1.0000 | ORAL_TABLET | Freq: Once | ORAL | Status: AC
  Administered 2015-02-08: 1 via ORAL
  Filled 2015-02-08: qty 1

## 2015-02-08 MED ORDER — TERBUTALINE SULFATE 1 MG/ML IJ SOLN
0.2500 mg | Freq: Once | INTRAMUSCULAR | Status: AC
  Administered 2015-02-08: 0.25 mg via SUBCUTANEOUS
  Filled 2015-02-08: qty 1

## 2015-02-08 MED ORDER — CYCLOBENZAPRINE HCL 10 MG PO TABS: 10.0000 mg | ORAL_TABLET | Freq: Three times a day (TID) | ORAL | Status: DC | PRN

## 2015-02-08 MED ORDER — NIFEDIPINE 10 MG PO CAPS
30.0000 mg | ORAL_CAPSULE | Freq: Once | ORAL | Status: AC
  Administered 2015-02-08: 30 mg via ORAL
  Filled 2015-02-08: qty 3

## 2015-02-08 MED ORDER — CYCLOBENZAPRINE HCL 10 MG PO TABS
10.0000 mg | ORAL_TABLET | Freq: Once | ORAL | Status: AC
  Administered 2015-02-08: 10 mg via ORAL
  Filled 2015-02-08: qty 1

## 2015-02-08 NOTE — MAU Provider Note (Signed)
History     CSN: [id number]  Arrival date and time: 02/08/15 0125   None     Chief Complaint  Patient presents with  . Contractions  . Headache  . Back Pain   HPI Ms Schneeberger is a 24yo [name] @ 33.5wks who presents for eval of ctx that have been going on 'all day' but that have recently become stronger @ 2300. Denies leaking or bldg; reports +FM. Her preg has been followed by the [facility] and has been remarkable for 1) hx of PTD @ 33 and 34 wks 2) del @ 19wks. She was recently admitted to Antenatal from 11/29-30 for ctx w/ initial cx change from 2.5-3cm and ctx q 2-3 mins where she received rescue doses of BMZ and mag sulfate therapy.  OB History    Gravida Para Term Preterm AB TAB SAB Ectopic Multiple Living        Past Medical History  Diagnosis Date  . No pertinent past medical history   . UTI (lower urinary tract infection)   . Anemia   . Preterm delivery   . Stillbirth     Past Surgical History  Procedure Laterality Date  . No past surgeries      Family History  Problem Relation Age of Onset  . Hypertension Mother     Social History  Substance Use Topics  . Smoking status: Never Smoker   . Smokeless tobacco: Never Used  . Alcohol Use: No    Allergies:  Allergies  Allergen Reactions  . Cherry Hives and Itching    Facility-administered medications prior to admission  Medication Dose Route Frequency Provider Last Rate Last Dose  . hydroxyprogesterone caproate (DELALUTIN) 250 mg/mL injection 250 mg  250 mg Intramuscular Weekly Peggy Constant, MD   250 mg at 12/27/14 1038   Prescriptions prior to admission  Medication Sig Dispense Refill Last Dose  . acetaminophen (TYLENOL) 325 MG tablet Take 325 mg by mouth every 6 (six) hours as needed for headache.    Past Week at Unknown time  . hydroxyprogesterone caproate (DELALUTIN) 250 mg/mL OIL injection Inject 250 mg into the muscle every 7 (seven) days. On Thursdays For 14 doses starting on  11-28-14   Past Week at Unknown time  . NIFEdipine (PROCARDIA-XL/ADALAT CC) 30 MG 24 hr tablet Take 1 tablet (30 mg total) by mouth daily. Can take twice a day if needed to control symptoms 60 tablet 3 02/07/2015 at 2300  . Prenat-FeFum-FePo-FA-Omega 3 (CONCEPT DHA) 53.5-38-1 MG CAPS Take 1 tablet by mouth daily. 30 capsule 2 02/07/2015 at Unknown time    ROS Physical Exam   Blood pressure 106/48, pulse 74, temperature 98.2 F (36.8 C), temperature source Oral, resp. rate 16, height  (1.6 m), weight 53.581 kg (118 lb 2 oz), last menstrual period 07/03/2014, SpO2 100 %, not currently breastfeeding.  Physical Exam  Constitutional: She is oriented to person, place, and time. She appears well-developed.  HENT:  Head: Normocephalic.  Neck: Normal range of motion.  Cardiovascular: Normal rate.   Respiratory: Effort normal.  GI:  EFM 140s, +accels, occ mi variables Ctx irreg 7-11 mins, palp mild  Genitourinary:  Cx unchanged on exams: 2.5/60/-2  Musculoskeletal: Normal range of motion.  Neurological: She is alert and oriented to person, place, and time.  Skin: Skin is warm and dry.  Psychiatric: She has a normal mood and affect. Her behavior is normal. Thought content normal.  Urinalysis    Component Value Date/Time   COLORURINE YELLOW 02/08/2015 0130   APPEARANCEUR CLEAR 02/08/2015 0130   LABSPEC 1.010 02/08/2015 0130   PHURINE 6.5 02/08/2015 0130   GLUCOSEU 250* 02/08/2015 0130   HGBUR NEGATIVE 02/08/2015 0130   BILIRUBINUR NEGATIVE 02/08/2015 0130   KETONESUR NEGATIVE 02/08/2015 0130   PROTEINUR NEGATIVE 02/08/2015 0130   UROBILINOGEN 1.0 01/09/2015 1859   NITRITE NEGATIVE 02/08/2015 0130   LEUKOCYTESUR SMALL* 02/08/2015 0130     MAU Course  Procedures  MDM UA ordered Given Flexeril & Percocet for back pain; Terb x 1 for ctx After Terb, only have irreg 20-30sec irritability. Given Procardia 30mg  prior to d/c.  Assessment and Plan  IUP@33 .4wks Preterm ctx  without cx change  D/C home after ensuring no cx change over time Preterm labor precautions Keep appt this coming week, but msg sent to clinic to try to get in on 12/5 or 6 for cx exam Continue home dosing of Procardia Still needs glucola- added to clinic msg  Cam HaiSHAW, Akito Boomhower CNM 02/08/2015, 3:30 AM

## 2015-02-08 NOTE — MAU Note (Signed)
Contractions started at noon.  They are 7 min apart now.  No bleeding.  Headache since 11 pm.  Back pain all day.  No leaking.  Baby moving well.

## 2015-02-08 NOTE — Discharge Instructions (Signed)

## 2015-02-11 ENCOUNTER — Encounter: Payer: Medicaid Other | Admitting: Advanced Practice Midwife

## 2015-02-12 ENCOUNTER — Inpatient Hospital Stay (HOSPITAL_COMMUNITY)
Admission: AD | Admit: 2015-02-12 | Discharge: 2015-02-12 | Disposition: A | Discharge status: Home / Self Care | Payer: Medicaid Other | Source: Ambulatory Visit | Attending: Obstetrics and Gynecology | Admitting: Obstetrics and Gynecology

## 2015-02-12 ENCOUNTER — Encounter (HOSPITAL_COMMUNITY): Payer: Self-pay | Admitting: *Deleted

## 2015-02-12 DIAGNOSIS — Z3A34 34 weeks gestation of pregnancy: Secondary | ICD-10-CM | POA: Diagnosis not present

## 2015-02-12 DIAGNOSIS — O4703 False labor before 37 completed weeks of gestation, third trimester: Secondary | ICD-10-CM | POA: Diagnosis not present

## 2015-02-12 LAB — WET PREP, GENITAL
CLUE CELLS WET PREP: NONE SEEN
Sperm: NONE SEEN
Trich, Wet Prep: NONE SEEN
Yeast Wet Prep HPF POC: NONE SEEN

## 2015-02-12 LAB — URINALYSIS, ROUTINE W REFLEX MICROSCOPIC
Bilirubin Urine: NEGATIVE
GLUCOSE, UA: 100 mg/dL — AB
Hgb urine dipstick: NEGATIVE
Ketones, ur: NEGATIVE mg/dL
LEUKOCYTES UA: NEGATIVE
Nitrite: NEGATIVE
PH: 6 (ref 5.0–8.0)
Protein, ur: NEGATIVE mg/dL
SPECIFIC GRAVITY, URINE: 1.02 (ref 1.005–1.030)

## 2015-02-12 LAB — FETAL FIBRONECTIN: FETAL FIBRONECTIN: POSITIVE — AB

## 2015-02-12 NOTE — Discharge Instructions (Signed)
Braxton Hicks Contractions °Contractions of the uterus can occur throughout pregnancy. Contractions are not always a sign that you are in labor.  °WHAT ARE BRAXTON HICKS CONTRACTIONS?  °Contractions that occur before labor are called Braxton Hicks contractions, or false labor. Toward the end of pregnancy (32-34 weeks), these contractions can develop more often and may become more forceful. This is not true labor because these contractions do not result in opening (dilatation) and thinning of the cervix. They are sometimes difficult to tell apart from true labor because these contractions can be forceful and people have different pain tolerances. You should not feel embarrassed if you go to the hospital with false labor. Sometimes, the only way to tell if you are in true labor is for your health care provider to look for changes in the cervix. °If there are no prenatal problems or other health problems associated with the pregnancy, it is completely safe to be sent home with false labor and await the onset of true labor. °HOW CAN YOU TELL THE DIFFERENCE BETWEEN TRUE AND FALSE LABOR? °False Labor °· The contractions of false labor are usually shorter and not as hard as those of true labor.   °· The contractions are usually irregular.   °· The contractions are often felt in the front of the lower abdomen and in the groin.   °· The contractions may go away when you walk around or change positions while lying down.   °· The contractions get weaker and are shorter lasting as time goes on.   °· The contractions do not usually become progressively stronger, regular, and closer together as with true labor.   °True Labor °· Contractions in true labor last 30-70 seconds, become very regular, usually become more intense, and increase in frequency.   °· The contractions do not go away with walking.   °· The discomfort is usually felt in the top of the uterus and spreads to the lower abdomen and low back.   °· True labor can be  determined by your health care provider with an exam. This will show that the cervix is dilating and getting thinner.   °WHAT TO REMEMBER °· Keep up with your usual exercises and follow other instructions given by your health care provider.   °· Take medicines as directed by your health care provider.   °· Keep your regular prenatal appointments.   °· Eat and drink lightly if you think you are going into labor.   °· If Braxton Hicks contractions are making you uncomfortable:   °¨ Change your position from lying down or resting to walking, or from walking to resting.   °¨ Sit and rest in a tub of warm water.   °¨ Drink 2-3 glasses of water. Dehydration may cause these contractions.   °¨ Do slow and deep breathing several times an hour.   °WHEN SHOULD I SEEK IMMEDIATE MEDICAL CARE? °Seek immediate medical care if: °· Your contractions become stronger, more regular, and closer together.   °· You have fluid leaking or gushing from your vagina.   °· You have a fever.   °· You pass blood-tinged mucus.   °· You have vaginal bleeding.   °· You have continuous abdominal pain.   °· You have low back pain that you never had before.   °· You feel your baby's head pushing down and causing pelvic pressure.   °· Your baby is not moving as much as it used to.   °  °This information is not intended to replace advice given to you by your health care provider. Make sure you discuss any questions you have with your health care   provider. °  °Document Released: 02/22/2005 Document Revised: 02/27/2013 Document Reviewed: 12/04/2012 °Elsevier Interactive Patient Education ©2016 Elsevier Inc. ° °

## 2015-02-12 NOTE — MAU Provider Note (Signed)
History     CSN: [id number]  Arrival date and time: 02/12/15 1900   First Provider Initiated Contact with Patient 02/12/15 1938      No chief complaint on file.  HPI 24yo [name] at 34.2weeks presents to MAU for labor evaluation. She started contracting this morning - every 5-7 minutes, then progressed to 4-5 minutes. Pain was an 8/10 on the pain scale. She also has some back pain, radiating to her groin. She is currently on procardia, started in August. It was helping before, but isn't helping currently. She has a history of preterm delivery in her previous 2 deliveries and is currently taking 17-P and Procardia. Denies LOF, VB. +FM. Endorses some clear vaginal discharge, does not itch, burn and is not malodorous. Last cervical exam - 3cm last Wednesday.   Past Medical History  Diagnosis Date  . No pertinent past medical history   . UTI (lower urinary tract infection)   . Anemia   . Preterm delivery   . Stillbirth     Past Surgical History  Procedure Laterality Date  . No past surgeries      Family History  Problem Relation Age of Onset  . Hypertension Mother     Social History  Substance Use Topics  . Smoking status: Never Smoker   . Smokeless tobacco: Never Used  . Alcohol Use: No    Allergies:  Allergies  Allergen Reactions  . Cherry Hives and Itching    Facility-administered medications prior to admission  Medication Dose Route Frequency Provider Last Rate Last Dose  . hydroxyprogesterone caproate (DELALUTIN) 250 mg/mL injection 250 mg  250 mg Intramuscular Weekly Mackenzie Constant, MD   250 mg at 12/27/14 1038   Prescriptions prior to admission  Medication Sig Dispense Refill Last Dose  . acetaminophen (TYLENOL) 325 MG tablet Take 325 mg by mouth every 6 (six) hours as needed for headache.    Past Week at Unknown time  . cyclobenzaprine (FLEXERIL) 10 MG tablet Take 1 tablet (10 mg total) by mouth 3 (three) times daily as needed for muscle spasms. 30 tablet 0    . hydroxyprogesterone caproate (DELALUTIN) 250 mg/mL OIL injection Inject 250 mg into the muscle every 7 (seven) days. On Thursdays For 14 doses starting on 11-28-14   Past Week at Unknown time  . NIFEdipine (PROCARDIA-XL/ADALAT CC) 30 MG 24 hr tablet Take 1 tablet (30 mg total) by mouth daily. Can take twice a day if needed to control symptoms 60 tablet 3 02/07/2015 at 2300  . Prenat-FeFum-FePo-FA-Omega 3 (CONCEPT DHA) 53.5-38-1 MG CAPS Take 1 tablet by mouth daily. 30 capsule 2 02/07/2015 at Unknown time    ROS  All other review of systems negative with exception of those listed above.  Physical Exam   Blood pressure 119/64, pulse 96, temperature 98.6 F (37 C), temperature source Oral, resp. rate 16, height  (1.6 m), weight 52.164 kg (115 lb), last menstrual period 07/03/2014, SpO2 100 %, not currently breastfeeding.  Physical Exam Gen: Alert, cooperative, no acute distress CV: RRR, normal peripheral perfusion Lungs: CTAB Abd: Gravid, nontender Cervical exam: 2/50%/-3 @ 20:15  Extremities: Nonedematous  FHTs: Reassuring, Cat 1. Ctx q5 minutes-> contractions every 6 -8 minutes    MAU Course  Procedures  MDM FFN obtained and was positive. However patient has had sex in the last day as well as endorsing cervical check yesterday, therefore making FFN invalid at this point. Wet prep negative  UA - negative  GC/Chlam.   Assessment  and Plan  [name] is 24 y.o. [phone] @ 3585w2d presenting with contractions every 6 - 8 minutes with some baseline uterine irritability, no cervical changes with contractions, cervical recheck at 10:15 @ 2/50%/-3. Fetal fibronectin was positive however patient with recent hx of coitus in the last 24 hours, therefore not accurate. Therefore patient with preterm contractions, without cervical change  - Continue Procardia and 17-P  - [facility]   [name] 02/12/2015, 7:47 PM   CNM attestation:  I have seen  and examined this patient; I agree with above documentation in the resident's note.   [name] is a 24 y.o. [id number] reporting ctx. +FM, denies LOF, VB, vaginal discharge.  PE: BP 107/66 mmHg  Pulse 104  Temp(Src) 98.6 F (37 C) (Oral)  Resp 16  Ht 5\' 3"  (1.6 m)  Wt 52.164 kg (115 lb)  BMI 20.38 kg/m2  SpO2 100%  LMP 07/03/2014 (Exact Date) Gen: calm comfortable, NAD Resp: normal effort, no distress Abd: gravid Cx: unchanged over time  ROS, labs, PMH reviewed NST reactive; ctx irreg  Plan: -  preterm labor precautions - continue Procardia as scheduled - continue routine follow up in OB clinic for 17P; needs glucola at next visit (tomorrow)  [name], CNM 10:46 PM  02/12/2015

## 2015-02-12 NOTE — MAU Note (Signed)
Pt reports pelvic pain, back pain and contractions all day, worsening this pm. Denies bleeding.

## 2015-02-13 ENCOUNTER — Other Ambulatory Visit: Payer: Medicaid Other

## 2015-02-13 LAB — GC/CHLAMYDIA PROBE AMP (~~LOC~~) NOT AT ARMC
CHLAMYDIA, DNA PROBE: NEGATIVE
NEISSERIA GONORRHEA: NEGATIVE

## 2015-02-17 ENCOUNTER — Inpatient Hospital Stay (HOSPITAL_COMMUNITY)
Admission: AD | Admit: 2015-02-17 | Discharge: 2015-02-18 | Disposition: A | Discharge status: Other Acute Inpatient Hospital | Payer: Medicaid Other | Source: Ambulatory Visit | Attending: Family Medicine | Admitting: Family Medicine

## 2015-02-17 ENCOUNTER — Encounter (HOSPITAL_COMMUNITY): Payer: Self-pay

## 2015-02-17 DIAGNOSIS — Z3A35 35 weeks gestation of pregnancy: Secondary | ICD-10-CM | POA: Insufficient documentation

## 2015-02-17 DIAGNOSIS — O99820 Streptococcus B carrier state complicating pregnancy: Secondary | ICD-10-CM | POA: Diagnosis not present

## 2015-02-17 DIAGNOSIS — O9989 Other specified diseases and conditions complicating pregnancy, childbirth and the puerperium: Secondary | ICD-10-CM

## 2015-02-17 DIAGNOSIS — Z283 Underimmunization status: Secondary | ICD-10-CM

## 2015-02-17 DIAGNOSIS — O09899 Supervision of other high risk pregnancies, unspecified trimester: Secondary | ICD-10-CM

## 2015-02-17 LAB — URINALYSIS, ROUTINE W REFLEX MICROSCOPIC
BILIRUBIN URINE: NEGATIVE
Glucose, UA: 250 mg/dL — AB
HGB URINE DIPSTICK: NEGATIVE
KETONES UR: 15 mg/dL — AB
Leukocytes, UA: NEGATIVE
NITRITE: NEGATIVE
PROTEIN: NEGATIVE mg/dL
Specific Gravity, Urine: 1.01 (ref 1.005–1.030)
pH: 7 (ref 5.0–8.0)

## 2015-02-17 MED ORDER — LACTATED RINGERS IV BOLUS (SEPSIS)
1000.0000 mL | Freq: Once | INTRAVENOUS | Status: AC
  Administered 2015-02-17: 1000 mL via INTRAVENOUS

## 2015-02-17 MED ORDER — BETAMETHASONE SOD PHOS & ACET 6 (3-3) MG/ML IJ SUSP
12.0000 mg | Freq: Once | INTRAMUSCULAR | Status: DC
  Filled 2015-02-17: qty 2

## 2015-02-17 NOTE — MAU Note (Signed)
Pt reports contractions q 2 minutes and back pain. History of PTL, last dose of procardia earlier today.

## 2015-02-18 MED ORDER — DEXTROSE 5 % IV SOLN
5.0000 10*6.[IU] | Freq: Once | INTRAVENOUS | Status: AC
  Administered 2015-02-18: 5 10*6.[IU] via INTRAVENOUS
  Filled 2015-02-18: qty 5

## 2015-02-18 MED ORDER — LACTATED RINGERS IV SOLN
INTRAVENOUS | Status: DC
  Administered 2015-02-18: via INTRAVENOUS

## 2015-02-18 NOTE — MAU Provider Note (Signed)
History     CSN: [id number]  Arrival date and time: 02/17/15 2204   First Provider Initiated Contact with Patient 02/18/15 0018      No chief complaint on file.  HPI  [name] is 24 y.o. [id number] @ 1443w1d. Patient is presenting with contractions that started @ 6PM. Contractions are regular and every 3-4 minutes. FM+,  No LOF, no vaginal bleeding.    Pt is seen in high risk clinic for hx preterm birth x 2, at 2541w6d and 2440w3d, and a previable delivery r/t PPROM at 19 weeks.    [facility]  Dating  EDC change:  16 wk ultrasound Blood type:   AB+  Genetic Screen 1 Screen: ordered   AFP:     Quad:     NIPS: Antibody: neg  Anatomic US  Normal Rubella:  Non immune  GTT Early:               Third trimester:  RPR:   NR  Flu vaccine 11/28/2014 HBsAg:   Neg  TDaP vaccine                                               Rhogam: HIV: Non-reactive (06/29 0000)   Baby Food  Breast                                        GBS:  Positive on 02/03/15  Contraception  Depo provera Pap: normal (09/2014)  Circumcision  n/a   Pediatrician  Dr. Christell ConstantMoore    Support Person  Shanda BumpsJessica (sister)      OB History    Gravida Para Term Preterm AB TAB SAB Ectopic Multiple Living   4 2 0 2 1 0 1 0 0 2       Past Medical History  Diagnosis Date  . No pertinent past medical history   . UTI (lower urinary tract infection)   . Anemia   . Preterm delivery   . Stillbirth     Past Surgical History  Procedure Laterality Date  . No past surgeries      Family History  Problem Relation Age of Onset  . Hypertension Mother     Social History  Substance Use Topics  . Smoking status: Never Smoker   . Smokeless tobacco: Never Used  . Alcohol Use: No    Allergies:  Allergies  Allergen Reactions  . Cherry Hives and Itching    Facility-administered medications prior to admission  Medication Dose Route Frequency Provider Last Rate Last Dose  . hydroxyprogesterone caproate (DELALUTIN) 250  mg/mL injection 250 mg  250 mg Intramuscular Weekly Peggy Constant, MD   250 mg at 12/27/14 1038   Prescriptions prior to admission  Medication Sig Dispense Refill Last Dose  . acetaminophen (TYLENOL) 325 MG tablet Take 325 mg by mouth every 6 (six) hours as needed for headache.    Past Week at Unknown time  . cyclobenzaprine (FLEXERIL) 10 MG tablet Take 1 tablet (10 mg total) by mouth 3 (three) times daily as needed for muscle spasms. 30 tablet 0   . hydroxyprogesterone caproate (DELALUTIN) 250 mg/mL OIL injection Inject 250 mg into the muscle every 7 (seven) days. On Thursdays For 14 doses starting on 11-28-14  Past Week at Unknown time  . NIFEdipine (PROCARDIA-XL/ADALAT CC) 30 MG 24 hr tablet Take 1 tablet (30 mg total) by mouth daily. Can take twice a day if needed to control symptoms 60 tablet 3 02/07/2015 at 2300  . Prenat-FeFum-FePo-FA-Omega 3 (CONCEPT DHA) 53.5-38-1 MG CAPS Take 1 tablet by mouth daily. 30 capsule 2 02/07/2015 at Unknown time    Review of Systems  Constitutional: Negative for fever and chills.  Gastrointestinal: Positive for abdominal pain. Negative for nausea and vomiting.  Genitourinary: Negative for dysuria.  Neurological: Negative for headaches.   Physical Exam   Blood pressure 109/69, pulse 119, temperature 98.1 F (36.7 C), temperature source Oral, resp. rate 16, height  (1.6 m), weight 115 lb (52.164 kg), last menstrual period 07/03/2014, SpO2 98 %, not currently breastfeeding.  Physical Exam  Cardiovascular: Normal rate and regular rhythm.  Exam reveals no gallop and no friction rub.   No murmur heard. Respiratory: Effort normal and breath sounds normal. No respiratory distress. She has no wheezes.  GI: Soft. Bowel sounds are normal. She exhibits no distension. There is no tenderness.  Musculoskeletal: Normal range of motion. She exhibits no edema.  Dilation: 4.5 Effacement (%): 50 Cervical Position: Posterior Station: -3 Presentation:  Vertex Exam by:: L Leftwich-Kirby CNM FHT:  FHR: 150 bpm, variability: moderate,  accelerations:present,  decelerations: none Toco with contractions every 2-5 minutes, mild to moderate to palpation  MAU Course  Procedures Results for orders placed or performed during the hospital encounter of 02/17/15 (from the past 24 hour(s))  Urinalysis, Routine w reflex microscopic (not at [facility])     Status: Abnormal   Collection Time: 02/17/15 10:08 PM  Result Value Ref Range   Color, Urine YELLOW YELLOW   APPearance CLEAR CLEAR   Specific Gravity, Urine 1.010 1.005 - 1.030   pH 7.0 5.0 - 8.0   Glucose, UA 250 (A) NEGATIVE mg/dL   Hgb urine dipstick NEGATIVE NEGATIVE   Bilirubin Urine NEGATIVE NEGATIVE   Ketones, ur 15 (A) NEGATIVE mg/dL   Protein, ur NEGATIVE NEGATIVE mg/dL   Nitrite NEGATIVE NEGATIVE   Leukocytes, UA NEGATIVE NEGATIVE     Assessment and Plan  Active Labor with PTL  - Received BMZ  10/5 and 10/6, then rescue doses 11/29- 11/30  - GBS positive, Given Penicillin G, 5 million units prior to transfer - Will transfer patient out as NICU is full   [name] 02/18/2015, 12:22 AM   I have seen this patient and agree with the above resident's note.  See my management note below:  MDM Ordered and reviewed labs.  Reviewed FHR tracing.  Cervix changed from 3 cm on 12/7 to 4 cm on arrival to 4.5 cm while in MAU. Contractions are frequent, palpate moderate, and uncomfortable per pt.  Pt has strong hx of preterm birth.  Plan to admit pt for preterm labor observation but NICU not accepting new preterm admissions at this time.  Call made to bed locator and Dr Elmo Putt at [place] accepted pt for transfer. Pt reevaluated prior to transfer and stable for transport.  First dose of PCN for GBS prophylaxis given via IV prior to transport.  LEFTWICH-KIRBY, Beni Turrell Certified Nurse-Midwife

## 2015-02-20 ENCOUNTER — Encounter: Payer: Self-pay | Admitting: *Deleted

## 2015-02-20 ENCOUNTER — Encounter: Payer: Medicaid Other | Admitting: Obstetrics & Gynecology

## 2015-02-20 ENCOUNTER — Encounter: Payer: Self-pay | Admitting: Obstetrics & Gynecology

## 2015-02-26 ENCOUNTER — Inpatient Hospital Stay (HOSPITAL_COMMUNITY)
Admission: AD | Admit: 2015-02-26 | Discharge: 2015-02-28 | DRG: 775 | Disposition: A | Discharge status: Home / Self Care | Payer: Medicaid Other | Source: Ambulatory Visit | Attending: Obstetrics & Gynecology | Admitting: Obstetrics & Gynecology

## 2015-02-26 ENCOUNTER — Encounter (HOSPITAL_COMMUNITY): Payer: Self-pay | Admitting: *Deleted

## 2015-02-26 DIAGNOSIS — O09899 Supervision of other high risk pregnancies, unspecified trimester: Secondary | ICD-10-CM

## 2015-02-26 DIAGNOSIS — O42913 Preterm premature rupture of membranes, unspecified as to length of time between rupture and onset of labor, third trimester: Principal | ICD-10-CM | POA: Diagnosis present

## 2015-02-26 DIAGNOSIS — Z3A36 36 weeks gestation of pregnancy: Secondary | ICD-10-CM

## 2015-02-26 DIAGNOSIS — O99824 Streptococcus B carrier state complicating childbirth: Secondary | ICD-10-CM | POA: Diagnosis present

## 2015-02-26 DIAGNOSIS — O0992 Supervision of high risk pregnancy, unspecified, second trimester: Secondary | ICD-10-CM

## 2015-02-26 DIAGNOSIS — Z8249 Family history of ischemic heart disease and other diseases of the circulatory system: Secondary | ICD-10-CM | POA: Diagnosis not present

## 2015-02-26 DIAGNOSIS — Z283 Underimmunization status: Secondary | ICD-10-CM

## 2015-02-26 DIAGNOSIS — O9989 Other specified diseases and conditions complicating pregnancy, childbirth and the puerperium: Secondary | ICD-10-CM

## 2015-02-26 DIAGNOSIS — O99820 Streptococcus B carrier state complicating pregnancy: Secondary | ICD-10-CM

## 2015-02-26 LAB — CBC
HEMATOCRIT: 30.2 % — AB (ref 36.0–46.0)
Hemoglobin: 9.5 g/dL — ABNORMAL LOW (ref 12.0–15.0)
MCH: 24.5 pg — ABNORMAL LOW (ref 26.0–34.0)
MCHC: 31.5 g/dL (ref 30.0–36.0)
MCV: 77.8 fL — AB (ref 78.0–100.0)
PLATELETS: 256 10*3/uL (ref 150–400)
RBC: 3.88 MIL/uL (ref 3.87–5.11)
RDW: 15.5 % (ref 11.5–15.5)
WBC: 8.2 10*3/uL (ref 4.0–10.5)

## 2015-02-26 LAB — RPR: RPR: NONREACTIVE

## 2015-02-26 LAB — TYPE AND SCREEN
ABO/RH(D): AB POS
ANTIBODY SCREEN: NEGATIVE

## 2015-02-26 MED ORDER — ONDANSETRON HCL 4 MG/2ML IJ SOLN
4.0000 mg | INTRAMUSCULAR | Status: DC | PRN
Start: 2015-02-26 — End: 2015-02-28

## 2015-02-26 MED ORDER — ZOLPIDEM TARTRATE 5 MG PO TABS
5.0000 mg | ORAL_TABLET | Freq: Every evening | ORAL | Status: DC | PRN
Start: 2015-02-26 — End: 2015-02-28

## 2015-02-26 MED ORDER — DIPHENHYDRAMINE HCL 25 MG PO CAPS: 25.0000 mg | ORAL_CAPSULE | Freq: Four times a day (QID) | ORAL | Status: DC | PRN

## 2015-02-26 MED ORDER — ACETAMINOPHEN 325 MG PO TABS: 650.0000 mg | ORAL_TABLET | ORAL | Status: DC | PRN

## 2015-02-26 MED ORDER — LACTATED RINGERS IV SOLN
INTRAVENOUS | Status: DC
  Administered 2015-02-26: 06:00:00 via INTRAVENOUS

## 2015-02-26 MED ORDER — OXYTOCIN 40 UNITS IN LACTATED RINGERS INFUSION - SIMPLE MED
62.5000 mL/h | INTRAVENOUS | Status: DC
  Filled 2015-02-26: qty 1000

## 2015-02-26 MED ORDER — FENTANYL CITRATE (PF) 100 MCG/2ML IJ SOLN
INTRAMUSCULAR | Status: AC
  Filled 2015-02-26: qty 2

## 2015-02-26 MED ORDER — IBUPROFEN 600 MG PO TABS
600.0000 mg | ORAL_TABLET | Freq: Four times a day (QID) | ORAL | Status: DC
  Administered 2015-02-26 – 2015-02-28 (×10): 600 mg via ORAL
  Filled 2015-02-26 (×10): qty 1

## 2015-02-26 MED ORDER — FENTANYL CITRATE (PF) 100 MCG/2ML IJ SOLN
100.0000 ug | INTRAMUSCULAR | Status: DC | PRN
  Administered 2015-02-26: 100 ug via INTRAVENOUS

## 2015-02-26 MED ORDER — OXYTOCIN BOLUS FROM INFUSION
500.0000 mL | INTRAVENOUS | Status: DC
  Administered 2015-02-26: 500 mL via INTRAVENOUS

## 2015-02-26 MED ORDER — MEDROXYPROGESTERONE ACETATE 150 MG/ML IM SUSP: 150.0000 mg | INTRAMUSCULAR | Status: DC | PRN

## 2015-02-26 MED ORDER — SODIUM CHLORIDE 0.9 % IV SOLN
2.0000 g | Freq: Once | INTRAVENOUS | Status: AC
  Administered 2015-02-26: 2 g via INTRAVENOUS
  Filled 2015-02-26: qty 2000

## 2015-02-26 MED ORDER — LANOLIN HYDROUS EX OINT: TOPICAL_OINTMENT | CUTANEOUS | Status: DC | PRN

## 2015-02-26 MED ORDER — PRENATAL MULTIVITAMIN CH
1.0000 | ORAL_TABLET | Freq: Every day | ORAL | Status: DC
  Administered 2015-02-27 – 2015-02-28 (×2): 1 via ORAL
  Filled 2015-02-26 (×2): qty 1

## 2015-02-26 MED ORDER — TETANUS-DIPHTH-ACELL PERTUSSIS 5-2.5-18.5 LF-MCG/0.5 IM SUSP: 0.5000 mL | Freq: Once | INTRAMUSCULAR | Status: DC

## 2015-02-26 MED ORDER — CITRIC ACID-SODIUM CITRATE 334-500 MG/5ML PO SOLN
30.0000 mL | ORAL | Status: DC | PRN
Start: 2015-02-26 — End: 2015-02-26

## 2015-02-26 MED ORDER — SENNOSIDES-DOCUSATE SODIUM 8.6-50 MG PO TABS
2.0000 | ORAL_TABLET | ORAL | Status: DC
  Administered 2015-02-26 – 2015-02-27 (×2): 2 via ORAL
  Filled 2015-02-26 (×2): qty 2

## 2015-02-26 MED ORDER — OXYCODONE-ACETAMINOPHEN 5-325 MG PO TABS: 2.0000 | ORAL_TABLET | ORAL | Status: DC | PRN

## 2015-02-26 MED ORDER — OXYCODONE-ACETAMINOPHEN 5-325 MG PO TABS
1.0000 | ORAL_TABLET | ORAL | Status: DC | PRN
  Administered 2015-02-26 (×2): 1 via ORAL
  Filled 2015-02-26 (×2): qty 1

## 2015-02-26 MED ORDER — OXYCODONE-ACETAMINOPHEN 5-325 MG PO TABS: 1.0000 | ORAL_TABLET | ORAL | Status: DC | PRN

## 2015-02-26 MED ORDER — WITCH HAZEL-GLYCERIN EX PADS
1.0000 | MEDICATED_PAD | CUTANEOUS | Status: DC | PRN
Start: 2015-02-26 — End: 2015-02-28

## 2015-02-26 MED ORDER — LIDOCAINE HCL (PF) 1 % IJ SOLN
30.0000 mL | INTRAMUSCULAR | Status: DC | PRN
  Filled 2015-02-26: qty 30

## 2015-02-26 MED ORDER — MEASLES, MUMPS & RUBELLA VAC ~~LOC~~ INJ
0.5000 mL | INJECTION | Freq: Once | SUBCUTANEOUS | Status: AC
  Administered 2015-02-28: 0.5 mL via SUBCUTANEOUS
  Filled 2015-02-26 (×2): qty 0.5

## 2015-02-26 MED ORDER — SIMETHICONE 80 MG PO CHEW: 80.0000 mg | CHEWABLE_TABLET | ORAL | Status: DC | PRN

## 2015-02-26 MED ORDER — DIBUCAINE 1 % RE OINT
1.0000 | TOPICAL_OINTMENT | RECTAL | Status: DC | PRN
Start: 2015-02-26 — End: 2015-02-28

## 2015-02-26 MED ORDER — LACTATED RINGERS IV SOLN: 500.0000 mL | INTRAVENOUS | Status: DC | PRN

## 2015-02-26 MED ORDER — BENZOCAINE-MENTHOL 20-0.5 % EX AERO: 1.0000 "application " | INHALATION_SPRAY | CUTANEOUS | Status: DC | PRN

## 2015-02-26 MED ORDER — ONDANSETRON HCL 4 MG PO TABS: 4.0000 mg | ORAL_TABLET | ORAL | Status: DC | PRN

## 2015-02-26 MED ORDER — ONDANSETRON HCL 4 MG/2ML IJ SOLN
4.0000 mg | Freq: Four times a day (QID) | INTRAMUSCULAR | Status: DC | PRN
Start: 2015-02-26 — End: 2015-02-26

## 2015-02-26 NOTE — H&P (Signed)
OBSTETRIC ADMISSION HISTORY AND PHYSICAL  [name] is a 24 y.o. female [phone] with IUP at [redacted]w[redacted]d by 16w [place] presenting for SROM and PTL. She reports +FMs, No LOF, no VB, no blurry vision, headaches or peripheral edema, and RUQ pain.  She plans on breast feeding. She request Depo for birth control.  Dating: By [place] --->  Estimated Date of Delivery: 03/24/15  Sono:    , CWD, normal anatomy, cephalic presentation, 146g,  45% EFW, Anterior placenta, above the os   Prenatal History/Complications: Hx PTL and delivery Rubella Non Immune GBS POSITIVE   Clinic [facility] Prenatal Labs  Dating EDC change: 16 wk ultrasound Blood type: AB+  Genetic Screen 1 Screen: ordered AFP: Quad: NIPS: Antibody: neg  Anatomic [place] Normal Rubella: Non immune  GTT Early: Third trimester:  RPR: NR  Flu vaccine 11/28/2014 HBsAg: Neg  TDaP vaccine  Rhogam: HIV: Non-reactive (06/29 0000)   Baby Food Breast  GBS: Positive on 02/03/15  Contraception Depo provera Pap: normal (09/2014)  Circumcision n/a   Pediatrician Dr. Christell Constant    Support Person Shanda Bumps (sister)        Past Medical History: Past Medical History  Diagnosis Date  . No pertinent past medical history   . UTI (lower urinary tract infection)   . Anemia   . Preterm delivery   . Stillbirth     Past Surgical History: Past Surgical History  Procedure Laterality Date  . No past surgeries      Obstetrical History: OB History    Gravida Para Term Preterm AB TAB SAB Ectopic Multiple Living        Social History: Social History   Social History  . Marital Status: Single    Spouse Name: N/A  . Number of Children: N/A  . Years of Education: N/A   Social History Main Topics  . Smoking status: Never Smoker   . Smokeless tobacco: Never Used  . Alcohol Use: No  . Drug Use: No  . Sexual  Activity: Yes    Birth Control/ Protection: None     Comment: last intercourse Feb 03 2015   Other Topics Concern  . Not on file   Social History Narrative    Family History: Family History  Problem Relation Age of Onset  . Hypertension Mother     Allergies: Allergies  Allergen Reactions  . Cherry Hives and Itching    Facility-administered medications prior to admission  Medication Dose Route Frequency Provider Last Rate Last Dose  . hydroxyprogesterone caproate (DELALUTIN) 250 mg/mL injection 250 mg  250 mg Intramuscular Weekly Peggy Constant, MD   250 mg at 12/27/14 1038   Prescriptions prior to admission  Medication Sig Dispense Refill Last Dose  . acetaminophen (TYLENOL) 325 MG tablet Take 325 mg by mouth every 6 (six) hours as needed for headache.    Past Week at Unknown time  . cyclobenzaprine (FLEXERIL) 10 MG tablet Take 1 tablet (10 mg total) by mouth 3 (three) times daily as needed for muscle spasms. 30 tablet 0   . hydroxyprogesterone caproate (DELALUTIN) 250 mg/mL OIL injection Inject 250 mg into the muscle every 7 (seven) days. On Thursdays For 14 doses starting on 11-28-14   Past Week at Unknown time  . NIFEdipine (PROCARDIA-XL/ADALAT CC) 30 MG 24 hr tablet Take 1 tablet (30 mg total) by mouth daily. Can take twice a day if needed to control symptoms 60 tablet 3 02/07/2015  at 2300  . Prenat-FeFum-FePo-FA-Omega 3 (CONCEPT DHA) 53.5-38-1 MG CAPS Take 1 tablet by mouth daily. 30 capsule 2 02/07/2015 at Unknown time     Review of Systems   All systems reviewed and negative except as stated in HPI  Last menstrual period 07/03/2014, not currently breastfeeding. General appearance: alert, cooperative and mild distress Lungs: clear to auscultation bilaterally Heart: regular rate and rhythm Abdomen: soft, non-tender; bowel sounds normal Extremities: Homans sign is negative, no sign of DVT Presentation: cephalic Fetal monitoringBaseline: 135 bpm, Variability: Good {>  6 bpm) and Accelerations: Reactive Uterine activityIntensity: strong, q1-592min Dilation: 5.5 Effacement (%): 80 Station: -2 Exam by:: Scientist, product/process developmentAmber Stovall RN   Prenatal labs: ABO, Rh: --/--/AB POS (11/29 0330) Antibody: NEG (11/29 0330) Rubella: NON IMMUNE RPR: Non Reactive (11/29 0330)  HBsAg: NEGATIVE (07/21 1607)  HIV: Non Reactive (06/29 1300)  GBS: Positive (11/29 0000)  1 hr Glucola wnl Genetic screening  declined Anatomy US wnl  Prenatal Transfer Tool  Maternal Diabetes: No Genetic Screening: Declined Maternal Ultrasounds/Referrals: Normal Fetal Ultrasounds or other Referrals:  None Maternal Substance Abuse:  No Significant Maternal Medications:  None Significant Maternal Lab Results: Lab values include: Group B Strep positive  No results found for this or any previous visit (from the past 24 hour(s)).  Patient Active Problem List   Diagnosis Date Noted  . Group B Streptococcus carrier, +RV culture, currently pregnant 02/05/2015  . Vaginal wall cyst 01/02/2015  . Preterm labor 12/11/2014  . Rubella non-immune status, antepartum 10/02/2014  . UTI (urinary tract infection) during pregnancy 09/30/2014  . Vaginal Pap smear with ASC-US 10/18/2013  . Pregnancy 09/04/2013  . History of preterm premature rupture of membranes (PROM) in previous pregnancy, currently pregnant in third trimester 09/25/2012  . Supervision of high risk pregnancy in second trimester 09/25/2012    Assessment: [name] is a 24 y.o. [id number] at 4351w2d here for SROM and PTL.   #Labor: Expectant Management - no need for augmentation.  #Pain: IV pain meds, patient does NOT want epidural.  #FWB: Excellent,. Cat 1 #ID:  GBS POSITIVE - start Amp #MOF: Breast #MOC: Depo #Circ:  N/A  [name] 02/26/2015, 5:39 AM   OB fellow attestation:  I have seen and examined this patient; I agree with above documentation in the resident's note.   [name] is a 24 y.o. [phone] here for active  labor  PE: BP 115/71 mmHg  Pulse 107  Temp(Src) 97.9 F (36.6 C)  LMP 07/03/2014 (Exact Date) Gen: calm comfortable, NAD Resp: normal effort, no distress Abd: gravid  ROS, labs, PMH reviewed  Plan: Admit to LD FWB- no need for BMZ as patient has received 2 courses, last ~2 weeks ago GBS pos- ampicillin given likely rapid labor  Federico FlakeKimberly Niles Demarrion Meiklejohn, MD Family Medicine, OB Fellow 02/26/2015, 6:53 AM

## 2015-02-26 NOTE — MAU Note (Signed)
Received pt by EMS with c/o contractions and gush of clear fluid at 0400; 36.2 wks, steriods x2 rounds given previously.

## 2015-02-26 NOTE — Lactation Note (Signed)
This note was copied from the chart of Mackenzie Magdalen SpatzQuameca Vessell. Lactation Consultation Note Initial visit at 13 hours of age.  Mom reports she is planning to pump and bottle feed exclusively as she did for 6 months with older child.  Mom has already started with DEBP at bedside and several mls of EBM collected.  MOm pumped for 15 minutes and then collected when she hand expressed after.  Encouraged mom to pump every 3 hours for 8X/24 hours.  Mom is giving formula also due to baby 2864w2d and <6 #s.  LPT green communication sheet for parents given and discussed as applicable to her.  [facility] LC resources given and discussed.  Encouraged to feed with early cues on demand and wake baby every 3 hours as needed.    Mom to call for assist as needed.    Patient Name: [name] Today's Date: 02/26/2015 Reason for consult: Initial assessment   Maternal Data Has patient been taught Hand Expression?: Yes Does the patient have breastfeeding experience prior to this delivery?: Yes  Feeding Feeding Type: Formula  LATCH Score/Interventions                      Lactation Tools Discussed/Used Pump Review: Setup, frequency, and cleaning;Milk Storage Initiated by:: JS already in room LC reviewed Date initiated:: 02/26/15   Consult Status Consult Status: PRN    [name], [name] 02/26/2015, 8:42 PM

## 2015-02-27 LAB — CBC
HCT: 27.3 % — ABNORMAL LOW (ref 36.0–46.0)
HEMOGLOBIN: 8.5 g/dL — AB (ref 12.0–15.0)
MCH: 24.4 pg — ABNORMAL LOW (ref 26.0–34.0)
MCHC: 31.1 g/dL (ref 30.0–36.0)
MCV: 78.2 fL (ref 78.0–100.0)
Platelets: 231 10*3/uL (ref 150–400)
RBC: 3.49 MIL/uL — AB (ref 3.87–5.11)
RDW: 15.6 % — ABNORMAL HIGH (ref 11.5–15.5)
WBC: 9.4 10*3/uL (ref 4.0–10.5)

## 2015-02-27 NOTE — Progress Notes (Addendum)
Post Partum Day 1 Subjective:  [name] is a 24 y.o. [id number] 5853w2d s/p NSVD after PTL and PPROM.  No acute events overnight.  Pt denies problems with ambulating, voiding or po intake.  She denies nausea or vomiting.  Pain is well controlled.  She has had flatus.  Lochia Minimal.  Plan for birth control is Depo-Provera.  Method of Feeding: Breast.   Objective: Blood pressure 94/60, pulse 76, temperature 98.3 F (36.8 C), temperature source Oral, resp. rate 18, last menstrual period 07/03/2014, unknown if currently breastfeeding.  Physical Exam:  General: alert, cooperative and no distress Lochia:normal flow Chest: normal WOB Heart: Regular rate Abdomen: +BS, soft, mild TTP (appropriate) Uterine Fundus: firm, below umbillicus DVT Evaluation: No evidence of DVT seen on physical exam. Extremities: No edema   Recent Labs  02/26/15 0630  HGB 9.5*  HCT 30.2*    Assessment/Plan:  ASSESSMENT: [name] is a 24 y.o. [id number] 4553w2d s/p NSVD.   Plan for discharge tomorrow Continue routine PP care Breastfeeding support PRN  LOS: 1 day   [name] 02/27/2015, 5:45 AM   CNM attestation Post Partum Day #1 I have seen and examined this patient and agree with above documentation in the resident's note.   [name] is a 24 y.o. [phone] s/p SVD.  Pt denies problems with ambulating, voiding or po intake. Pain is well controlled.  Plan for birth control is Depo-Provera.  Method of Feeding: breast  PE:  BP 89/45 mmHg  Pulse 69  Temp(Src) 98.3 F (36.8 C) (Oral)  Resp 18  LMP 07/03/2014 (Exact Date)  Breastfeeding? Unknown Fundus firm  Plan for discharge: 02/28/15  Cam HaiSHAW, KIMBERLY, CNM 8:44 AM  02/27/2015

## 2015-02-27 NOTE — Progress Notes (Signed)
UR chart review completed.  

## 2015-02-27 NOTE — Lactation Note (Signed)
This note was copied from the chart of Mackenzie Magdalen SpatzQuameca Atha. Lactation Consultation Note  Follow up visit made.  Mom is pumping and bottle feeding EBM/formula.  She is obtaining 5 mls with each pumping.   Instructed to change to standard setting when volume pumped increased to 15 mls.  No questions at present.  Encouraged to call with concerns/assist prn.  Patient Name: [name] Today's Date: 02/27/2015     Maternal Data    Feeding Feeding Type: Formula Nipple Type: Slow - flow  LATCH Score/Interventions                      Lactation Tools Discussed/Used     Consult Status      Huston FoleyMOULDEN, Berl Bonfanti S 02/27/2015, 6:04 PM

## 2015-02-28 MED ORDER — IBUPROFEN 600 MG PO TABS: 600.0000 mg | ORAL_TABLET | Freq: Four times a day (QID) | ORAL | Status: DC

## 2015-02-28 NOTE — Discharge Instructions (Signed)

## 2015-02-28 NOTE — Discharge Summary (Signed)
OB Discharge Summary     Patient Name: Mackenzie SpatzQuameca Weist DOB: 11/09/1990 MRN: [id number]  Date of admission: 02/26/2015 Delivering MD: Olivia CanterSCHULTZ, AMANDA   Date of discharge: 02/28/2015  Admitting diagnosis: 37wks,contractions Intrauterine pregnancy: 565w2d     Secondary diagnosis:  Active Problems:   Preterm labor  Additional problems: none     Discharge diagnosis: Preterm Pregnancy Delivered                                                                                                Post partum procedures:none  Augmentation: none  Complications: None  Hospital course:  Onset of Labor With Vaginal Delivery     24 y.o. yo [phone] at 565w2d was admitted in Active Laboron 02/26/2015. Patient had an uncomplicated labor course as follows:  Membrane Rupture Time/Date: 4:00 AM ,02/26/2015   Intrapartum Procedures: Episiotomy: None [1]                                         Lacerations:  None [1]  Patient had a delivery of a Viable infant. 02/26/2015  Information for the patient's newborn:  Melvern BankerBennett, Girl Caitlynne [[id number]][[id number]]  Delivery Method: Vaginal, Spontaneous Delivery (Filed from Delivery Summary)    Pateint had an uncomplicated postpartum course.  She is ambulating, tolerating a regular diet, passing flatus, and urinating well. Patient is discharged home in stable condition on No discharge date for patient encounter.Marland Kitchen.    Physical exam  Filed Vitals:   02/26/15 1834 02/27/15 0552 02/27/15 1730 02/28/15 0622  BP: 94/60 89/45 108/62 103/62  Pulse: 76 69 87 85  Temp: 98.3 F (36.8 C) 98.3 F (36.8 C) 98.1 F (36.7 C) 98.3 F (36.8 C)  TempSrc: Oral Oral Oral Oral  Resp: 18 18 18 17    General: alert, cooperative and no distress Lochia: appropriate Uterine Fundus: soft Incision: N/A DVT Evaluation: No evidence of DVT seen on physical exam. Labs: Lab Results  Component Value Date   WBC 9.4 02/27/2015   HGB 8.5* 02/27/2015   HCT 27.3* 02/27/2015   MCV 78.2  02/27/2015   PLT 231 02/27/2015   No flowsheet data found.  Discharge instruction: per After Visit Summary and "Baby and Me Booklet".  After visit meds:    Medication List    ASK your doctor about these medications        acetaminophen 325 MG tablet  Commonly known as:  TYLENOL  Take 325 mg by mouth every 6 (six) hours as needed for headache.     CONCEPT DHA 53.5-38-1 MG Caps  Take 1 tablet by mouth daily.     cyclobenzaprine 10 MG tablet  Commonly known as:  FLEXERIL  Take 1 tablet (10 mg total) by mouth 3 (three) times daily as needed for muscle spasms.     hydroxyprogesterone caproate 250 mg/mL Oil injection  Commonly known as:  DELALUTIN  Inject 250 mg into the muscle every 7 (seven) days. On Thursdays For 14 doses starting on 11-28-14  NIFEdipine 30 MG 24 hr tablet  Commonly known as:  PROCARDIA-XL/ADALAT CC  Take 1 tablet (30 mg total) by mouth daily. Can take twice a day if needed to control symptoms        Diet: routine diet  Activity: Advance as tolerated. Pelvic rest for 6 weeks.   Outpatient follow up:6 weeks Follow up Appt:No future appointments. Follow up Visit:No Follow-up on file.  Postpartum contraception: Depo Provera  Newborn Data: Live born female  Birth Weight: 5 lb 6.6 oz (2455 g) APGAR: 8, 9  Baby Feeding: Breast Disposition:home with mother   02/28/2015 Greig Right, CNM

## 2015-03-10 ENCOUNTER — Encounter: Payer: Self-pay | Admitting: Family Medicine

## 2015-03-19 ENCOUNTER — Ambulatory Visit: Payer: Medicaid Other

## 2015-03-20 ENCOUNTER — Encounter: Payer: Self-pay | Admitting: Obstetrics & Gynecology

## 2015-03-20 ENCOUNTER — Other Ambulatory Visit: Payer: Self-pay | Admitting: *Deleted

## 2015-03-20 DIAGNOSIS — Z7251 High risk heterosexual behavior: Secondary | ICD-10-CM

## 2015-03-20 MED ORDER — LEVONORGESTREL 1.5 MG PO TABS: 1.5000 mg | ORAL_TABLET | Freq: Once | ORAL | Status: DC

## 2015-04-04 ENCOUNTER — Encounter: Payer: Self-pay | Admitting: Obstetrics & Gynecology

## 2015-04-07 ENCOUNTER — Encounter: Payer: Self-pay | Admitting: Obstetrics and Gynecology

## 2015-04-07 ENCOUNTER — Ambulatory Visit (INDEPENDENT_AMBULATORY_CARE_PROVIDER_SITE_OTHER): Payer: Medicaid Other | Admitting: Obstetrics and Gynecology

## 2015-04-07 ENCOUNTER — Other Ambulatory Visit (HOSPITAL_COMMUNITY)
Admission: RE | Admit: 2015-04-07 | Discharge: 2015-04-07 | Disposition: A | Discharge status: Home / Self Care | Payer: Medicaid Other | Source: Ambulatory Visit | Attending: Obstetrics and Gynecology | Admitting: Obstetrics and Gynecology

## 2015-04-07 VITALS — BP 115/70 | HR 70 | Temp 98.2°F | Ht 63.0 in | Wt 105.3 lb

## 2015-04-07 DIAGNOSIS — Z309 Encounter for contraceptive management, unspecified: Secondary | ICD-10-CM | POA: Insufficient documentation

## 2015-04-07 DIAGNOSIS — N898 Other specified noninflammatory disorders of vagina: Secondary | ICD-10-CM | POA: Diagnosis not present

## 2015-04-07 DIAGNOSIS — Z30430 Encounter for insertion of intrauterine contraceptive device: Secondary | ICD-10-CM

## 2015-04-07 DIAGNOSIS — Z113 Encounter for screening for infections with a predominantly sexual mode of transmission: Secondary | ICD-10-CM | POA: Diagnosis not present

## 2015-04-07 DIAGNOSIS — Z3202 Encounter for pregnancy test, result negative: Secondary | ICD-10-CM

## 2015-04-07 LAB — POCT PREGNANCY, URINE: Preg Test, Ur: NEGATIVE

## 2015-04-07 MED ORDER — LEVONORGESTREL 20 MCG/24HR IU IUD
INTRAUTERINE_SYSTEM | Freq: Once | INTRAUTERINE | Status: AC
  Administered 2015-04-07: 1 via INTRAUTERINE

## 2015-04-07 NOTE — Progress Notes (Signed)
CLINIC ENCOUNTER NOTE  History:  25 y.o. [name] here today for postpartum visit.  Uncomplicated vaginal delivery 12/21. Preterm delivery @ 36+2. Baby went home with mom, went home with mom. Breast and bottle feeding. Pumps. No nipple pain or infections. No sadness, not feeling down. No questions for this provider. Had a period on the 17th. Has resumed sexual activity. Last sex on the 9th. UTD on pap.   Past Medical History  Diagnosis Date  . No pertinent past medical history   . UTI (lower urinary tract infection)   . Anemia   . Preterm delivery   . Stillbirth     Past Surgical History  Procedure Laterality Date  . No past surgeries      The following portions of the patient's history were reviewed and updated as appropriate: allergies, current medications, past family history, past medical history, past social history, past surgical history and problem list.    Review of Systems:  See above; comprehensive review of systems was otherwise negative.  Objective:  Physical Exam BP 115/70 mmHg  Pulse 70  Temp(Src) 98.2 F (36.8 C) (Oral)  Ht  (1.6 m)  Wt 105 lb 4.8 oz (47.764 kg)  BMI 18.66 kg/m2  LMP 03/25/2015  Breastfeeding? Yes CONSTITUTIONAL: Well-developed, well-nourished female in no acute distress.  HENT:  Normocephalic, atraumatic SKIN: Skin is warm and dry.  NEUROLGIC: Alert  PSYCHIATRIC: Normal mood and affect.  CARDIOVASCULAR: Normal heart rate noted RESPIRATORY: Effort and breath sounds normal, no problems with respiration noted ABDOMEN: Soft, no distention noted.  No tenderness, rebound or guarding.  PELVIC: normal vagina and cervix, slightly anteverted uterus, moderate amount white discharge. No cmt or adnexal fullness/tenderness  Procedure Mirena insertion Bimanual exam Speculum Betadine x2 Tenaculum anterior cervix Sound to 7 cm Liletta placed according to [employer] specifications, but device removed with removal of inserter Mirena then  placed according to device's specifications Pt tolerated procedure well. Bleeding at tenaculum sites controlled with monsel's   Labs and Imaging No results found.  Assessment & Plan:   # Postpartum visit - doing well, utd pap  # Contraceptive mgmt - Mirena placed today - f/u 4 wks string check  Routine preventative health maintenance measures emphasized.     Canyon Lohr B. Caeley Dohrmann, MD OB/GYN Fellow Center for [employer], [facility]

## 2015-04-08 ENCOUNTER — Other Ambulatory Visit: Payer: Self-pay | Admitting: Family

## 2015-04-08 ENCOUNTER — Telehealth: Payer: Self-pay | Admitting: General Practice

## 2015-04-08 LAB — WET PREP, GENITAL
TRICH WET PREP: NONE SEEN
YEAST WET PREP: NONE SEEN

## 2015-04-08 LAB — GC/CHLAMYDIA PROBE AMP (~~LOC~~) NOT AT ARMC
CHLAMYDIA, DNA PROBE: NEGATIVE
NEISSERIA GONORRHEA: NEGATIVE

## 2015-04-08 MED ORDER — METRONIDAZOLE 0.75 % VA GEL: 1.0000 | Freq: Every day | VAGINAL | Status: DC

## 2015-04-08 NOTE — Telephone Encounter (Signed)
Per Dr Ashok Pall, Wet prep positive for BV.  As she is not pregnant, not necessary to treat if she is asymptomatic. Rx for metrogel has been sent to pharmacy if patient desires treatment or symptoms are bothersome. Called patient and informed her of results and asked if she was having any symptoms. Patient states no. Told patient treatment isn't necessary but if she develops symptoms or would like treatment anyway we have sent in a Rx to her pharmacy. Patient verbalized understanding & had no questions

## 2015-04-30 ENCOUNTER — Encounter: Payer: Self-pay | Admitting: *Deleted

## 2015-05-01 ENCOUNTER — Ambulatory Visit: Payer: Medicaid Other | Admitting: Obstetrics & Gynecology

## 2015-05-13 ENCOUNTER — Encounter: Payer: Self-pay | Admitting: Obstetrics & Gynecology

## 2015-08-29 ENCOUNTER — Encounter: Payer: Self-pay | Admitting: Family Medicine

## 2015-08-29 ENCOUNTER — Encounter: Payer: Self-pay | Admitting: Obstetrics & Gynecology

## 2016-06-14 ENCOUNTER — Other Ambulatory Visit (HOSPITAL_COMMUNITY): Payer: Self-pay | Admitting: Internal Medicine

## 2016-06-21 ENCOUNTER — Ambulatory Visit: Payer: Medicaid Other

## 2016-08-24 ENCOUNTER — Inpatient Hospital Stay (HOSPITAL_COMMUNITY)
Admission: AD | Admit: 2016-08-24 | Discharge: 2016-08-25 | Disposition: A | Discharge status: Home / Self Care | Payer: Medicaid Other | Source: Ambulatory Visit | Attending: Family Medicine | Admitting: Family Medicine

## 2016-08-24 ENCOUNTER — Encounter (HOSPITAL_COMMUNITY): Payer: Self-pay

## 2016-08-24 DIAGNOSIS — Z888 Allergy status to other drugs, medicaments and biological substances status: Secondary | ICD-10-CM | POA: Insufficient documentation

## 2016-08-24 DIAGNOSIS — R1033 Periumbilical pain: Secondary | ICD-10-CM

## 2016-08-24 DIAGNOSIS — Z8249 Family history of ischemic heart disease and other diseases of the circulatory system: Secondary | ICD-10-CM | POA: Diagnosis not present

## 2016-08-24 DIAGNOSIS — Z79899 Other long term (current) drug therapy: Secondary | ICD-10-CM | POA: Diagnosis not present

## 2016-08-24 LAB — CBC WITH DIFFERENTIAL/PLATELET
BASOS ABS: 0 10*3/uL (ref 0.0–0.1)
BASOS PCT: 0 %
EOS PCT: 1 %
Eosinophils Absolute: 0.1 10*3/uL (ref 0.0–0.7)
HCT: 37.6 % (ref 36.0–46.0)
Hemoglobin: 12.3 g/dL (ref 12.0–15.0)
Lymphocytes Relative: 23 %
Lymphs Abs: 2.4 10*3/uL (ref 0.7–4.0)
MCH: 28 pg (ref 26.0–34.0)
MCHC: 32.7 g/dL (ref 30.0–36.0)
MCV: 85.5 fL (ref 78.0–100.0)
Monocytes Absolute: 0.3 10*3/uL (ref 0.1–1.0)
Monocytes Relative: 3 %
Neutro Abs: 7.5 10*3/uL (ref 1.7–7.7)
Neutrophils Relative %: 73 %
PLATELETS: 255 10*3/uL (ref 150–400)
RBC: 4.4 MIL/uL (ref 3.87–5.11)
RDW: 13 % (ref 11.5–15.5)
WBC: 10.4 10*3/uL (ref 4.0–10.5)

## 2016-08-24 LAB — URINALYSIS, ROUTINE W REFLEX MICROSCOPIC
BILIRUBIN URINE: NEGATIVE
Glucose, UA: NEGATIVE mg/dL
HGB URINE DIPSTICK: NEGATIVE
Ketones, ur: NEGATIVE mg/dL
NITRITE: NEGATIVE
PROTEIN: NEGATIVE mg/dL
Specific Gravity, Urine: 1.009 (ref 1.005–1.030)
pH: 6 (ref 5.0–8.0)

## 2016-08-24 LAB — COMPREHENSIVE METABOLIC PANEL
ALBUMIN: 4.5 g/dL (ref 3.5–5.0)
ALT: 13 U/L — ABNORMAL LOW (ref 14–54)
AST: 19 U/L (ref 15–41)
Alkaline Phosphatase: 49 U/L (ref 38–126)
Anion gap: 8 (ref 5–15)
BUN: 10 mg/dL (ref 6–20)
CHLORIDE: 106 mmol/L (ref 101–111)
CO2: 23 mmol/L (ref 22–32)
Calcium: 8.8 mg/dL — ABNORMAL LOW (ref 8.9–10.3)
Creatinine, Ser: 0.73 mg/dL (ref 0.44–1.00)
GFR calc Af Amer: >60 mL/min (ref >60)
GLUCOSE: 100 mg/dL — AB (ref 65–99)
POTASSIUM: 3.6 mmol/L (ref 3.5–5.1)
SODIUM: 137 mmol/L (ref 135–145)
Total Bilirubin: 1.8 mg/dL — ABNORMAL HIGH (ref 0.3–1.2)
Total Protein: 7.4 g/dL (ref 6.5–8.1)

## 2016-08-24 LAB — LIPASE, BLOOD: Lipase: 21 U/L (ref 11–51)

## 2016-08-24 LAB — POCT PREGNANCY, URINE: PREG TEST UR: NEGATIVE

## 2016-08-24 LAB — AMYLASE: AMYLASE: 60 U/L (ref 28–100)

## 2016-08-24 MED ORDER — KETOROLAC TROMETHAMINE 60 MG/2ML IM SOLN
60.0000 mg | Freq: Once | INTRAMUSCULAR | Status: AC
  Administered 2016-08-24: 60 mg via INTRAMUSCULAR
  Filled 2016-08-24: qty 2

## 2016-08-24 NOTE — MAU Note (Signed)
Pt reports sharp, constant mid abdominal and mid back pain. States the pain started around 330p today. Rates 9/10. Pt took "pain reliver" at 5pm-did not help. Pt denies vaginal bleeding or discharge, urinary s/s. Pt has IUD.

## 2016-08-24 NOTE — Discharge Instructions (Signed)

## 2016-08-24 NOTE — MAU Provider Note (Signed)
History     CSN: [id number]  Arrival date and time: 08/24/16 2027   First Provider Initiated Contact with Patient 08/24/16 2240      Chief Complaint  Patient presents with  . Abdominal Pain   Abdominal Pain  This is a new problem. The current episode started today. The onset quality is sudden. The problem occurs constantly. The problem has been unchanged. The pain is located in the periumbilical region. The pain is at a severity of 9/10. The abdominal pain radiates to the back. Pertinent negatives include no dysuria, fever, frequency, nausea or vomiting. Exacerbated by: going from laying to sitting, and bending knees  The pain is relieved by nothing. Treatments tried: advil. The treatment provided no relief.   Past Medical History:  Diagnosis Date  . Anemia   . No pertinent past medical history   . Preterm delivery   . Stillbirth   . UTI (lower urinary tract infection)     Past Surgical History:  Procedure Laterality Date  . NO PAST SURGERIES      Family History  Problem Relation Age of Onset  . Hypertension Mother     Social History  Substance Use Topics  . Smoking status: Never Smoker  . Smokeless tobacco: Never Used  . Alcohol use No    Allergies:  Allergies  Allergen Reactions  . Cherry Hives and Itching    Prescriptions Prior to Admission  Medication Sig Dispense Refill Last Dose  . cyclobenzaprine (FLEXERIL) 10 MG tablet Take 1 tablet (10 mg total) by mouth 3 (three) times daily as needed for muscle spasms. (Patient not taking: Reported on 04/07/2015) 30 tablet 0 Not Taking  . ibuprofen (ADVIL,MOTRIN) 600 MG tablet Take 1 tablet (600 mg total) by mouth every 6 (six) hours. (Patient not taking: Reported on 04/07/2015) 90 tablet 0 Not Taking  . levonorgestrel (PLAN B 1-STEP) 1.5 MG tablet Take 1 tablet (1.5 mg total) by mouth once. (Patient not taking: Reported on 04/07/2015) 1 tablet 0 Not Taking  . metroNIDAZOLE (METROGEL) 0.75 % vaginal gel Place 1  Applicatorful vaginally at bedtime. Apply one applicatorful to vagina at bedtime for 5 days 70 g 1   . Prenat-FeFum-FePo-FA-Omega 3 (CONCEPT DHA) 53.5-38-1 MG CAPS TAKE 1 TABLET BY MOUTH DAILY. 30 capsule 2     Review of Systems  Constitutional: Negative for chills and fever.  Gastrointestinal: Positive for abdominal pain. Negative for nausea and vomiting.  Genitourinary: Negative for dysuria, frequency, urgency, vaginal bleeding and vaginal discharge.   Physical Exam   Blood pressure 104/60, pulse 85, temperature 98.4 F (36.9 C), resp. rate 16, height 5\' 3"  (1.6 m), weight 94 lb (42.6 kg), SpO2 100 %, currently breastfeeding.  Physical Exam  Nursing note and vitals reviewed. Constitutional: She is oriented to person, place, and time. She appears well-developed and well-nourished. No distress.  HENT:  Head: Normocephalic.  Cardiovascular: Normal rate.   Respiratory: Effort normal.  GI: Soft. There is no tenderness. There is no rebound.  Neurological: She is alert and oriented to person, place, and time.  Skin: Skin is warm and dry.  Psychiatric: She has a normal mood and affect.   Results for orders placed or performed during the hospital encounter of 08/24/16 (from the past 24 hour(s))  Urinalysis, Routine w reflex microscopic     Status: Abnormal   Collection Time: 08/24/16  8:45 PM  Result Value Ref Range   Color, Urine YELLOW YELLOW   APPearance CLEAR CLEAR   Specific Gravity,  Urine 1.009 1.005 - 1.030   pH 6.0 5.0 - 8.0   Glucose, UA NEGATIVE NEGATIVE mg/dL   Hgb urine dipstick NEGATIVE NEGATIVE   Bilirubin Urine NEGATIVE NEGATIVE   Ketones, ur NEGATIVE NEGATIVE mg/dL   Protein, ur NEGATIVE NEGATIVE mg/dL   Nitrite NEGATIVE NEGATIVE   Leukocytes, UA TRACE (A) NEGATIVE   RBC / HPF 0-5 0 - 5 RBC/hpf   WBC, UA 0-5 0 - 5 WBC/hpf   Bacteria, UA RARE (A) NONE SEEN   Squamous Epithelial / LPF 0-5 (A) NONE SEEN   Mucous PRESENT   Pregnancy, urine POC     Status: None    Collection Time: 08/24/16  8:59 PM  Result Value Ref Range   Preg Test, Ur NEGATIVE NEGATIVE  CBC with Differential/Platelet     Status: None   Collection Time: 08/24/16 10:43 PM  Result Value Ref Range   WBC 10.4 4.0 - 10.5 K/uL   RBC 4.40 3.87 - 5.11 MIL/uL   Hemoglobin 12.3 12.0 - 15.0 g/dL   HCT 08.6 57.8 - 46.9 %   MCV 85.5 78.0 - 100.0 fL   MCH 28.0 26.0 - 34.0 pg   MCHC 32.7 30.0 - 36.0 g/dL   RDW 62.9 52.8 - 41.3 %   Platelets 255 150 - 400 K/uL   Neutrophils Relative % 73 %   Neutro Abs 7.5 1.7 - 7.7 K/uL   Lymphocytes Relative 23 %   Lymphs Abs 2.4 0.7 - 4.0 K/uL   Monocytes Relative 3 %   Monocytes Absolute 0.3 0.1 - 1.0 K/uL   Eosinophils Relative 1 %   Eosinophils Absolute 0.1 0.0 - 0.7 K/uL   Basophils Relative 0 %   Basophils Absolute 0.0 0.0 - 0.1 K/uL  Comprehensive metabolic panel     Status: Abnormal   Collection Time: 08/24/16 10:43 PM  Result Value Ref Range   Sodium 137 135 - 145 mmol/L   Potassium 3.6 3.5 - 5.1 mmol/L   Chloride 106 101 - 111 mmol/L   CO2 23 22 - 32 mmol/L   Glucose, Bld 100 (H) 65 - 99 mg/dL   BUN 10 6 - 20 mg/dL   Creatinine, Ser 2.44 0.44 - 1.00 mg/dL   Calcium 8.8 (L) 8.9 - 10.3 mg/dL   Total Protein 7.4 6.5 - 8.1 g/dL   Albumin 4.5 3.5 - 5.0 g/dL   AST 19 15 - 41 U/L   ALT 13 (L) 14 - 54 U/L   Alkaline Phosphatase 49 38 - 126 U/L   Total Bilirubin 1.8 (H) 0.3 - 1.2 mg/dL   GFR calc non Af Amer >60 >60 mL/min   GFR calc Af Amer >60 >60 mL/min   Anion gap 8 5 - 15  Amylase     Status: None   Collection Time: 08/24/16 10:43 PM  Result Value Ref Range   Amylase 60 28 - 100 U/L  Lipase, blood     Status: None   Collection Time: 08/24/16 10:43 PM  Result Value Ref Range   Lipase 21 11 - 51 U/L    MAU Course  Procedures  MDM Patient has had toradol for pain. She reports that her pain is better at this time.  Patient is eating and drinking without complaint.   Assessment and Plan   1. Periumbilical abdominal pain     DC home Comfort measures reviewed  RX: none  Return to MAU as needed  Follow-up Information    Department, [facility]  Follow up.   Contact information: [address] [place] [address] [phone]            Thressa ShellerHeather Marleena Shubert 08/24/2016, 10:41 PM

## 2016-08-25 ENCOUNTER — Other Ambulatory Visit (HOSPITAL_COMMUNITY): Payer: Self-pay | Admitting: Internal Medicine

## 2016-08-25 ENCOUNTER — Encounter: Payer: Self-pay | Admitting: Family Medicine

## 2016-09-13 ENCOUNTER — Encounter: Payer: Self-pay | Admitting: Family Medicine

## 2016-09-13 ENCOUNTER — Ambulatory Visit (INDEPENDENT_AMBULATORY_CARE_PROVIDER_SITE_OTHER): Payer: Medicaid Other | Admitting: Family Medicine

## 2016-09-13 ENCOUNTER — Other Ambulatory Visit (HOSPITAL_COMMUNITY)
Admission: RE | Admit: 2016-09-13 | Discharge: 2016-09-13 | Disposition: A | Discharge status: Home / Self Care | Payer: Medicaid Other | Source: Ambulatory Visit | Attending: Family Medicine | Admitting: Family Medicine

## 2016-09-13 VITALS — BP 104/67 | HR 71 | Ht 63.0 in | Wt 92.2 lb

## 2016-09-13 DIAGNOSIS — Z30432 Encounter for removal of intrauterine contraceptive device: Secondary | ICD-10-CM

## 2016-09-13 DIAGNOSIS — N898 Other specified noninflammatory disorders of vagina: Secondary | ICD-10-CM | POA: Insufficient documentation

## 2016-09-13 NOTE — Progress Notes (Signed)
Pt desiring IUD removal due to abdominal and pelvic pain. Desires condoms for contraception. Discussed how to use - advised use with spermacide.  IUD Removal  Patient was in the dorsal lithotomy position, normal external genitalia was noted.  A speculum was placed in the patient's vagina, normal discharge was noted, no lesions. The multiparous cervix was visualized, no lesions, no abnormal discharge,  and was swabbed with Betadine using scopettes.  The strings of the IUD was grasped and pulled using ring forceps.  The IUD was successfully removed in its entirety.  Patient tolerated the procedure well.

## 2016-09-14 ENCOUNTER — Encounter: Payer: Self-pay | Admitting: Family Medicine

## 2016-09-14 ENCOUNTER — Other Ambulatory Visit: Payer: Self-pay | Admitting: Family Medicine

## 2016-09-14 LAB — CERVICOVAGINAL ANCILLARY ONLY
BACTERIAL VAGINITIS: POSITIVE — AB
CHLAMYDIA, DNA PROBE: NEGATIVE
Candida vaginitis: POSITIVE — AB
Neisseria Gonorrhea: NEGATIVE
Trichomonas: NEGATIVE

## 2016-09-14 MED ORDER — MICONAZOLE NITRATE 2 % VA CREA: 1.0000 | TOPICAL_CREAM | Freq: Every day | VAGINAL | 2 refills | Status: DC

## 2016-09-14 MED ORDER — METRONIDAZOLE 500 MG PO TABS: 500.0000 mg | ORAL_TABLET | Freq: Two times a day (BID) | ORAL | 0 refills | Status: DC

## 2016-11-18 ENCOUNTER — Other Ambulatory Visit: Payer: Self-pay | Admitting: Family Medicine

## 2016-11-19 MED ORDER — METRONIDAZOLE 500 MG PO TABS: 500.0000 mg | ORAL_TABLET | Freq: Two times a day (BID) | ORAL | 0 refills | Status: DC

## 2016-11-29 ENCOUNTER — Ambulatory Visit: Payer: Self-pay

## 2017-01-01 ENCOUNTER — Encounter: Payer: Self-pay | Admitting: Family Medicine

## 2017-01-10 ENCOUNTER — Ambulatory Visit: Payer: Medicaid Other

## 2017-02-05 ENCOUNTER — Encounter: Payer: Self-pay | Admitting: Family Medicine

## 2017-03-08 NOTE — L&D Delivery Note (Signed)
Delivery Note 27 y.o. female [phone] at [redacted]w[redacted]d presenting for SROM  admitted for SOL  At 12:07 AM a viable female was delivered via Vaginal, Spontaneous (Presentation: vertex ; LOA  ).  APGAR: 7, 8; weight pending .   Placenta status: intact , central cord isnertion .  Cord: 3 vessel with the following complications: none.  Cord pH: N/A  Anesthesia:  None Episiotomy: None Lacerations: None Suture Repair: N/A Est. Blood Loss (mL): 150  Mom to postpartum.  Baby to Couplet care / Skin to Skin.  Felisa Bonier, MD, PGY Family Medicine - [facility] [place] 07/21/2017, 12:26 AM

## 2017-03-09 ENCOUNTER — Encounter: Payer: Self-pay | Admitting: Obstetrics and Gynecology

## 2017-03-09 ENCOUNTER — Ambulatory Visit: Payer: Medicaid Other | Admitting: *Deleted

## 2017-03-09 DIAGNOSIS — Z3201 Encounter for pregnancy test, result positive: Secondary | ICD-10-CM | POA: Diagnosis present

## 2017-03-09 DIAGNOSIS — Z3490 Encounter for supervision of normal pregnancy, unspecified, unspecified trimester: Secondary | ICD-10-CM

## 2017-03-09 DIAGNOSIS — Z3200 Encounter for pregnancy test, result unknown: Secondary | ICD-10-CM

## 2017-03-09 LAB — POCT PREGNANCY, URINE: PREG TEST UR: POSITIVE — AB

## 2017-03-09 NOTE — Progress Notes (Addendum)
Pt informed of +UPT.  LMP unknown.  Pt states she had IUD removed in July 2018 and never had a period.  She began having morning sickness during second week of October and had positive home UPT at that time. Now approximately 15-17 wks. Pt denies vaginal bleeding or other pregnancy related problems. Medication reconciliation completed.  US for dating and anatomy scheduled on 03/16/17. Pt will schedule New Ob appt.

## 2017-03-14 ENCOUNTER — Encounter (HOSPITAL_COMMUNITY): Payer: Self-pay | Admitting: Obstetrics and Gynecology

## 2017-03-16 ENCOUNTER — Ambulatory Visit (HOSPITAL_COMMUNITY): Payer: Medicaid Other

## 2017-03-22 ENCOUNTER — Encounter (HOSPITAL_COMMUNITY): Payer: Self-pay

## 2017-03-22 ENCOUNTER — Other Ambulatory Visit: Payer: Self-pay | Admitting: Obstetrics and Gynecology

## 2017-03-22 ENCOUNTER — Ambulatory Visit (HOSPITAL_COMMUNITY)
Admission: RE | Admit: 2017-03-22 | Discharge: 2017-03-22 | Disposition: A | Discharge status: Home / Self Care | Payer: Medicaid Other | Source: Ambulatory Visit | Attending: Obstetrics and Gynecology | Admitting: Obstetrics and Gynecology

## 2017-03-22 DIAGNOSIS — O09219 Supervision of pregnancy with history of pre-term labor, unspecified trimester: Secondary | ICD-10-CM | POA: Diagnosis not present

## 2017-03-22 DIAGNOSIS — Z3490 Encounter for supervision of normal pregnancy, unspecified, unspecified trimester: Secondary | ICD-10-CM

## 2017-03-22 DIAGNOSIS — Z3689 Encounter for other specified antenatal screening: Secondary | ICD-10-CM

## 2017-03-22 DIAGNOSIS — Z3A21 21 weeks gestation of pregnancy: Secondary | ICD-10-CM

## 2017-03-22 DIAGNOSIS — Z3687 Encounter for antenatal screening for uncertain dates: Secondary | ICD-10-CM | POA: Insufficient documentation

## 2017-03-23 ENCOUNTER — Encounter: Payer: Medicaid Other | Admitting: Obstetrics & Gynecology

## 2017-03-25 ENCOUNTER — Encounter: Payer: Self-pay | Admitting: Family Medicine

## 2017-04-04 ENCOUNTER — Encounter: Payer: Self-pay | Admitting: Obstetrics and Gynecology

## 2017-04-04 ENCOUNTER — Other Ambulatory Visit (HOSPITAL_COMMUNITY)
Admission: RE | Admit: 2017-04-04 | Discharge: 2017-04-04 | Disposition: A | Discharge status: Home / Self Care | Payer: Medicaid Other | Source: Ambulatory Visit | Attending: Obstetrics and Gynecology | Admitting: Obstetrics and Gynecology

## 2017-04-04 ENCOUNTER — Ambulatory Visit (INDEPENDENT_AMBULATORY_CARE_PROVIDER_SITE_OTHER): Payer: Medicaid Other | Admitting: Obstetrics and Gynecology

## 2017-04-04 DIAGNOSIS — O09899 Supervision of other high risk pregnancies, unspecified trimester: Secondary | ICD-10-CM | POA: Insufficient documentation

## 2017-04-04 DIAGNOSIS — O0990 Supervision of high risk pregnancy, unspecified, unspecified trimester: Secondary | ICD-10-CM

## 2017-04-04 DIAGNOSIS — O09219 Supervision of pregnancy with history of pre-term labor, unspecified trimester: Secondary | ICD-10-CM | POA: Insufficient documentation

## 2017-04-04 DIAGNOSIS — O0993 Supervision of high risk pregnancy, unspecified, third trimester: Secondary | ICD-10-CM

## 2017-04-04 DIAGNOSIS — O09213 Supervision of pregnancy with history of pre-term labor, third trimester: Secondary | ICD-10-CM

## 2017-04-04 HISTORY — DX: Supervision of other high risk pregnancies, unspecified trimester: O09.899

## 2017-04-04 LAB — OB RESULTS CONSOLE GC/CHLAMYDIA: Gonorrhea: NEGATIVE

## 2017-04-04 MED ORDER — PROGESTERONE MICRONIZED 200 MG PO CAPS: ORAL_CAPSULE | ORAL | 3 refills | Status: DC

## 2017-04-04 NOTE — Progress Notes (Signed)
INITIAL PRENATAL VISIT NOTE  Subjective:  [name] is a 27 y.o. [id number] at [redacted]w[redacted]d by [place] last week being seen today for her initial prenatal visit. This is an unplanned pregnancy. She was using IUD for birth control previously but was taken out 09/2016 for pain. She has an obstetric history significant for 3x pre-term SVD. She has a medical history significant for anemia.  Patient reports small amount of cramping.  Contractions: Not present. Vag. Bleeding: None.  Movement: Present. Denies leaking of fluid.   Past Medical History:  Diagnosis Date  . Anemia   . No pertinent past medical history   . Preterm delivery   . Stillbirth   . UTI (lower urinary tract infection)    Past Surgical History:  Procedure Laterality Date  . NO PAST SURGERIES     OB History  Gravida Para Term Preterm AB Living  5 3 0 3 1 3   SAB TAB Ectopic Multiple Live Births  1 0 0 0 3    # Outcome Date GA Lbr Len/2nd Weight Sex Delivery Anes PTL Lv  5 Current           4 Preterm 02/26/15 [redacted]w[redacted]d 11:50 / 00:12 5 lb 6.6 oz (2.455 kg) F Vag-Spont None  LIV  3 Preterm 09/11/13 [redacted]w[redacted]d 185:01 / 00:04 4 lb 5.8 oz (1.98 kg) M Vag-Spont None  LIV  2 Preterm 11/24/12 [redacted]w[redacted]d 04:55 / 00:45 3 lb 15.9 oz (1.81 kg) F Vag-Spont None  LIV  1 SAB  [redacted]w[redacted]d      N      Birth Comments: PPROM at 19 weeks 3 days; ??incomop cervix-did not labor at home, came in with membranes ruptured.     Social History   Socioeconomic History  . Marital status: Single    Spouse name: None  . Number of children: None  . Years of education: None  . Highest education level: None  Social Needs  . Financial resource strain: None  . Food insecurity - worry: None  . Food insecurity - inability: None  . Transportation needs - medical: None  . Transportation needs - non-medical: None  Occupational History  . None  Tobacco Use  . Smoking status: Never Smoker  . Smokeless tobacco: Never Used  Substance and Sexual Activity  . Alcohol use: No    . Drug use: No  . Sexual activity: Yes    Birth control/protection: None    Comment: last intercourse Feb 03 2015  Other Topics Concern  . None  Social History Narrative  . None    Family History  Problem Relation Age of Onset  . Hypertension Mother     (Not in a hospital admission)  Allergies  Allergen Reactions  . Cherry Hives and Itching   Review of Systems: Negative except for what is mentioned in HPI.  Objective:   Vitals:   04/04/17 1050  BP: 98/61  Pulse: 78  Weight: 110 lb 6.4 oz (50.1 kg)   Fetal Status: Fetal Heart Rate (bpm): 140   Movement: Present     Physical Exam: BP 98/61   Pulse 78   Wt 110 lb 6.4 oz (50.1 kg)   BMI 19.56 kg/m  CONSTITUTIONAL: Well-developed, well-nourished female in no acute distress.  NEUROLOGIC: Alert and oriented to person, place, and time. Normal reflexes, muscle tone coordination. No cranial nerve deficit noted. PSYCHIATRIC: Normal mood and affect. Normal behavior. Normal judgment and thought content. SKIN: Skin is warm and dry. No rash noted. Not  diaphoretic. No erythema. No pallor. HENT:  Normocephalic, atraumatic, External right and left ear normal. Oropharynx is clear and moist EYES: Conjunctivae and EOM are normal. Pupils are equal, round, and reactive to light. No scleral icterus.  NECK: Normal range of motion, supple, no masses CARDIOVASCULAR: Normal heart rate noted, regular rhythm RESPIRATORY: Effort and breath sounds normal, no problems with respiration noted BREASTS: symmetric, non-tender, no masses palpable ABDOMEN: Soft, nontender, nondistended, gravid. GU: normal appearing external female genitalia, multiparous normal appearing cervix, long and closed, scant white discharge in vagina, no lesions noted Bimanual: 23 weeks sized uterus, no adnexal tenderness or palpable lesions noted MUSCULOSKELETAL: Normal range of motion. EXT:  No edema and no tenderness. 2+ distal pulses.   Assessment and Plan:   Pregnancy: [id number] at 4038w5d by 21 week US. No complaints today  1. Supervision of high risk pregnancy, antepartum - Obstetric Panel, Including HIV - SMN1 COPY NUMBER ANALYSIS (SMA Carrier Screen) - Hemoglobinopathy Evaluation - Culture, OB Urine - Enroll Patient in Babyscripts - Cervicovaginal ancillary only - US MFM OB FOLLOW UP; Future  2. History of preterm delivery, currently pregnant, unspecified trimester 3 x SDV preterm Was on Makena in two prior pregnancies Normal CL on 03/22/17 Offered and accepted vaginal progesterone  3. Pap due 09/2017 Will complete post partum   Preterm labor symptoms and general obstetric precautions including but not limited to vaginal bleeding, contractions, leaking of fluid and fetal movement were reviewed in detail with the patient.  Please refer to After Visit Summary for other counseling recommendations.   Return in about 3 weeks (around 04/25/2017) for OB visit (MD), 2 hr GTT.  Conan BowensKelly M Jacky Dross 04/04/2017 11:34 AM

## 2017-04-04 NOTE — Progress Notes (Signed)
Here for new ob visit. Signed up for babyscripts app only. Declines flu shot today. Declines genetic testing. PMH form completed.

## 2017-04-05 ENCOUNTER — Encounter: Payer: Self-pay | Admitting: *Deleted

## 2017-04-05 LAB — CERVICOVAGINAL ANCILLARY ONLY
Chlamydia: NEGATIVE
NEISSERIA GONORRHEA: NEGATIVE

## 2017-04-06 LAB — OBSTETRIC PANEL, INCLUDING HIV
Antibody Screen: NEGATIVE
BASOS ABS: 0 10*3/uL (ref 0.0–0.2)
Basos: 0 %
EOS (ABSOLUTE): 0.2 10*3/uL (ref 0.0–0.4)
Eos: 2 %
HEMATOCRIT: 33.5 % — AB (ref 34.0–46.6)
HEP B S AG: NEGATIVE
HIV SCREEN 4TH GENERATION: NONREACTIVE
Hemoglobin: 10.8 g/dL — ABNORMAL LOW (ref 11.1–15.9)
IMMATURE GRANS (ABS): 0.1 10*3/uL (ref 0.0–0.1)
Immature Granulocytes: 1 %
LYMPHS: 11 %
Lymphocytes Absolute: 0.8 10*3/uL (ref 0.7–3.1)
MCH: 27.6 pg (ref 26.6–33.0)
MCHC: 32.2 g/dL (ref 31.5–35.7)
MCV: 86 fL (ref 79–97)
MONOCYTES: 7 %
Monocytes Absolute: 0.6 10*3/uL (ref 0.1–0.9)
NEUTROS ABS: 6.2 10*3/uL (ref 1.4–7.0)
Neutrophils: 79 %
PLATELETS: 270 10*3/uL (ref 150–379)
RBC: 3.92 x10E6/uL (ref 3.77–5.28)
RDW: 13.8 % (ref 12.3–15.4)
RPR: NONREACTIVE
RUBELLA: 1.64 {index} (ref >0.99)
Rh Factor: POSITIVE
WBC: 7.9 10*3/uL (ref 3.4–10.8)

## 2017-04-06 LAB — HEMOGLOBINOPATHY EVALUATION
Ferritin: 12 ng/mL — ABNORMAL LOW (ref 15–150)
HGB A: 98 % (ref 96.4–98.8)
HGB C: 0 %
HGB F QUANT: 0 % (ref 0.0–2.0)
HGB S: 0 %
HGB SOLUBILITY: NEGATIVE
Hgb A2 Quant: 2 % (ref 1.8–3.2)
Hgb Variant: 0 %

## 2017-04-09 LAB — URINE CULTURE, OB REFLEX

## 2017-04-09 LAB — CULTURE, OB URINE

## 2017-04-11 LAB — SMN1 COPY NUMBER ANALYSIS (SMA CARRIER SCREENING)

## 2017-04-14 ENCOUNTER — Encounter: Payer: Self-pay | Admitting: *Deleted

## 2017-04-14 ENCOUNTER — Encounter: Payer: Self-pay | Admitting: Obstetrics & Gynecology

## 2017-04-14 NOTE — Progress Notes (Signed)
Dr. Macon LargeAnyanwu would like for patient to get 17P.  Patient has Medicaid and is 25 weeks.  I think Medicaid will cover.  We do need the patient to come to the office to sign the application.  I have sent a My Chart message to the patient asking her to come to the office tomorrow or one day next week to sign the application.  Once we get the application signed, we can forward to request the medication.

## 2017-04-26 ENCOUNTER — Encounter: Payer: Medicaid Other | Admitting: Obstetrics and Gynecology

## 2017-04-30 ENCOUNTER — Encounter: Payer: Self-pay | Admitting: Obstetrics and Gynecology

## 2017-05-09 ENCOUNTER — Encounter: Payer: Self-pay | Admitting: *Deleted

## 2017-05-09 ENCOUNTER — Ambulatory Visit (HOSPITAL_COMMUNITY)
Admission: RE | Admit: 2017-05-09 | Discharge: 2017-05-09 | Disposition: A | Discharge status: Home / Self Care | Payer: Medicaid Other | Source: Ambulatory Visit | Attending: Obstetrics and Gynecology | Admitting: Obstetrics and Gynecology

## 2017-05-12 ENCOUNTER — Other Ambulatory Visit: Payer: Self-pay | Admitting: Obstetrics and Gynecology

## 2017-05-12 ENCOUNTER — Ambulatory Visit (INDEPENDENT_AMBULATORY_CARE_PROVIDER_SITE_OTHER): Payer: Medicaid Other | Admitting: Obstetrics & Gynecology

## 2017-05-12 ENCOUNTER — Encounter (HOSPITAL_COMMUNITY): Payer: Self-pay

## 2017-05-12 ENCOUNTER — Ambulatory Visit (HOSPITAL_COMMUNITY)
Admission: RE | Admit: 2017-05-12 | Discharge: 2017-05-12 | Disposition: A | Discharge status: Home / Self Care | Payer: Medicaid Other | Source: Ambulatory Visit | Attending: Obstetrics and Gynecology | Admitting: Obstetrics and Gynecology

## 2017-05-12 VITALS — BP 108/65 | HR 84 | Wt 117.1 lb

## 2017-05-12 DIAGNOSIS — Z362 Encounter for other antenatal screening follow-up: Secondary | ICD-10-CM

## 2017-05-12 DIAGNOSIS — O09213 Supervision of pregnancy with history of pre-term labor, third trimester: Secondary | ICD-10-CM | POA: Insufficient documentation

## 2017-05-12 DIAGNOSIS — O0993 Supervision of high risk pregnancy, unspecified, third trimester: Secondary | ICD-10-CM | POA: Insufficient documentation

## 2017-05-12 DIAGNOSIS — O0990 Supervision of high risk pregnancy, unspecified, unspecified trimester: Secondary | ICD-10-CM

## 2017-05-12 DIAGNOSIS — Z23 Encounter for immunization: Secondary | ICD-10-CM

## 2017-05-12 DIAGNOSIS — O09219 Supervision of pregnancy with history of pre-term labor, unspecified trimester: Secondary | ICD-10-CM

## 2017-05-12 DIAGNOSIS — Z3A29 29 weeks gestation of pregnancy: Secondary | ICD-10-CM

## 2017-05-12 DIAGNOSIS — O09899 Supervision of other high risk pregnancies, unspecified trimester: Secondary | ICD-10-CM

## 2017-05-12 NOTE — Patient Instructions (Addendum)

## 2017-05-12 NOTE — Addendum Note (Signed)
Encounter addended by: Melynda KellerVics, Elisha Mcgruder R, RDMS on: 05/12/2017 4:14 PM  Actions taken: Imaging Exam ended

## 2017-05-12 NOTE — Progress Notes (Signed)
   PRENATAL VISIT NOTE  Subjective:  [name] is a 27 y.o. [id number] at 5874w1d being seen today for ongoing prenatal care.  She is currently monitored for the following issues for this high-risk pregnancy and has Supervision of high risk pregnancy, antepartum and History of preterm delivery, currently pregnant, unspecified trimester on their problem list.  Patient reports no complaints.  Contractions: Irritability. Vag. Bleeding: None.  Movement: Present. Denies leaking of fluid.   The following portions of the patient's history were reviewed and updated as appropriate: allergies, current medications, past family history, past medical history, past social history, past surgical history and problem list. Problem list updated.  Objective:   Vitals:   05/12/17 1336  BP: 108/65  Pulse: 84  Weight: 117 lb 1.6 oz (53.1 kg)    Fetal Status: Fetal Heart Rate (bpm): 136   Movement: Present     General:  Alert, oriented and cooperative. Patient is in no acute distress.  Skin: Skin is warm and dry. No rash noted.   Cardiovascular: Normal heart rate noted  Respiratory: Normal respiratory effort, no problems with respiration noted  Abdomen: Soft, gravid, appropriate for gestational age.  Pain/Pressure: Absent     Pelvic: Cervical exam deferred        Extremities: Normal range of motion.  Edema: None  Mental Status:  Normal mood and affect. Normal behavior. Normal judgment and thought content.   Assessment and Plan:  Pregnancy: [id number] at 8574w1d  1. Supervision of high risk pregnancy, antepartum  - CBC - HIV antibody - RPR - Tdap vaccine greater than or equal to 7yo IM  2. History of preterm delivery, currently pregnant, unspecified trimester Using Prometrium  Preterm labor symptoms and general obstetric precautions including but not limited to vaginal bleeding, contractions, leaking of fluid and fetal movement were reviewed in detail with the patient. Please refer to After Visit  Summary for other counseling recommendations.  Return in about 2 weeks (around 05/26/2017) for 2 hr GTT.   Scheryl DarterJames Arnold, MD

## 2017-05-25 ENCOUNTER — Encounter: Payer: Medicaid Other | Admitting: Obstetrics and Gynecology

## 2017-05-25 ENCOUNTER — Other Ambulatory Visit: Payer: Medicaid Other

## 2017-05-26 ENCOUNTER — Encounter: Payer: Self-pay | Admitting: Obstetrics and Gynecology

## 2017-05-30 ENCOUNTER — Other Ambulatory Visit: Payer: Self-pay | Admitting: General Practice

## 2017-05-30 DIAGNOSIS — O219 Vomiting of pregnancy, unspecified: Secondary | ICD-10-CM

## 2017-05-30 MED ORDER — PRENATAL PLUS 27-1 MG PO TABS: 1.0000 | ORAL_TABLET | Freq: Every day | ORAL | 6 refills | Status: DC

## 2017-05-30 MED ORDER — ONDANSETRON HCL 4 MG PO TABS: 4.0000 mg | ORAL_TABLET | Freq: Three times a day (TID) | ORAL | 2 refills | Status: DC | PRN

## 2017-06-07 ENCOUNTER — Telehealth: Payer: Self-pay | Admitting: General Practice

## 2017-06-07 NOTE — Telephone Encounter (Signed)
Called patient to schedule f/u visit.  Patient missed last OB appt.  Left message on VM for patient to give our office a call back in regards to appt on 06/09/17 at 8:55am.

## 2017-06-07 NOTE — Telephone Encounter (Signed)
-----   Message from Kathee Deltonarrie L Hillman, RN sent at 05/30/2017  4:11 PM EDT ----- Patient no showed to appt on 3/20 & would like rescheduled. Thanks!

## 2017-06-09 ENCOUNTER — Encounter: Payer: Medicaid Other | Admitting: Family Medicine

## 2017-06-09 ENCOUNTER — Encounter: Payer: Self-pay | Admitting: Family Medicine

## 2017-06-09 NOTE — Progress Notes (Signed)
Patient did not keep appointment today. She will be called to reschedule.  

## 2017-06-17 ENCOUNTER — Other Ambulatory Visit: Payer: Self-pay | Admitting: *Deleted

## 2017-06-17 DIAGNOSIS — O0993 Supervision of high risk pregnancy, unspecified, third trimester: Secondary | ICD-10-CM

## 2017-06-20 ENCOUNTER — Other Ambulatory Visit: Payer: Medicaid Other

## 2017-06-22 ENCOUNTER — Other Ambulatory Visit: Payer: Self-pay

## 2017-06-22 ENCOUNTER — Encounter (HOSPITAL_COMMUNITY): Payer: Self-pay | Admitting: *Deleted

## 2017-06-22 ENCOUNTER — Inpatient Hospital Stay (HOSPITAL_COMMUNITY)
Admission: AD | Admit: 2017-06-22 | Discharge: 2017-06-23 | DRG: 833 | Disposition: A | Discharge status: Home / Self Care | Payer: Medicaid Other | Source: Ambulatory Visit | Attending: Obstetrics and Gynecology | Admitting: Obstetrics and Gynecology

## 2017-06-22 DIAGNOSIS — O09219 Supervision of pregnancy with history of pre-term labor, unspecified trimester: Secondary | ICD-10-CM

## 2017-06-22 DIAGNOSIS — O09899 Supervision of other high risk pregnancies, unspecified trimester: Secondary | ICD-10-CM

## 2017-06-22 DIAGNOSIS — Z3A35 35 weeks gestation of pregnancy: Secondary | ICD-10-CM | POA: Diagnosis not present

## 2017-06-22 DIAGNOSIS — O09893 Supervision of other high risk pregnancies, third trimester: Secondary | ICD-10-CM

## 2017-06-22 DIAGNOSIS — O0990 Supervision of high risk pregnancy, unspecified, unspecified trimester: Secondary | ICD-10-CM

## 2017-06-22 LAB — URINALYSIS, ROUTINE W REFLEX MICROSCOPIC
Bilirubin Urine: NEGATIVE
GLUCOSE, UA: NEGATIVE mg/dL
HGB URINE DIPSTICK: NEGATIVE
Ketones, ur: NEGATIVE mg/dL
NITRITE: NEGATIVE
PH: 6 (ref 5.0–8.0)
Protein, ur: NEGATIVE mg/dL
SPECIFIC GRAVITY, URINE: 1.004 — AB (ref 1.005–1.030)

## 2017-06-22 LAB — CBC
HCT: 30.5 % — ABNORMAL LOW (ref 36.0–46.0)
Hemoglobin: 9.6 g/dL — ABNORMAL LOW (ref 12.0–15.0)
MCH: 24.9 pg — ABNORMAL LOW (ref 26.0–34.0)
MCHC: 31.5 g/dL (ref 30.0–36.0)
MCV: 79.2 fL (ref 78.0–100.0)
PLATELETS: 271 10*3/uL (ref 150–400)
RBC: 3.85 MIL/uL — AB (ref 3.87–5.11)
RDW: 15.1 % (ref 11.5–15.5)
WBC: 8.9 10*3/uL (ref 4.0–10.5)

## 2017-06-22 LAB — TYPE AND SCREEN
ABO/RH(D): AB POS
Antibody Screen: NEGATIVE

## 2017-06-22 LAB — OB RESULTS CONSOLE GBS: STREP GROUP B AG: NEGATIVE

## 2017-06-22 MED ORDER — LACTATED RINGERS IV SOLN: 500.0000 mL | INTRAVENOUS | Status: DC | PRN

## 2017-06-22 MED ORDER — BUTORPHANOL TARTRATE 1 MG/ML IJ SOLN
2.0000 mg | Freq: Once | INTRAMUSCULAR | Status: AC
  Administered 2017-06-22: 2 mg via INTRAVENOUS
  Filled 2017-06-22: qty 2

## 2017-06-22 MED ORDER — BETAMETHASONE SOD PHOS & ACET 6 (3-3) MG/ML IJ SUSP
12.0000 mg | Freq: Two times a day (BID) | INTRAMUSCULAR | Status: DC
Start: 2017-06-22 — End: 2017-06-22

## 2017-06-22 MED ORDER — OXYTOCIN 40 UNITS IN LACTATED RINGERS INFUSION - SIMPLE MED: 2.5000 [IU]/h | INTRAVENOUS | Status: DC

## 2017-06-22 MED ORDER — OXYCODONE-ACETAMINOPHEN 5-325 MG PO TABS: 2.0000 | ORAL_TABLET | ORAL | Status: DC | PRN

## 2017-06-22 MED ORDER — BETAMETHASONE SOD PHOS & ACET 6 (3-3) MG/ML IJ SUSP
12.0000 mg | INTRAMUSCULAR | Status: DC
  Administered 2017-06-22: 12 mg via INTRAMUSCULAR
  Filled 2017-06-22 (×2): qty 2

## 2017-06-22 MED ORDER — OXYTOCIN BOLUS FROM INFUSION: 500.0000 mL | Freq: Once | INTRAVENOUS | Status: DC

## 2017-06-22 MED ORDER — LACTATED RINGERS IV SOLN
INTRAVENOUS | Status: DC
Start: 2017-06-22 — End: 2017-06-23
  Administered 2017-06-22: 09:00:00 via INTRAVENOUS

## 2017-06-22 MED ORDER — ONDANSETRON HCL 4 MG/2ML IJ SOLN: 4.0000 mg | Freq: Four times a day (QID) | INTRAMUSCULAR | Status: DC | PRN

## 2017-06-22 MED ORDER — PENICILLIN G POTASSIUM 5000000 UNITS IJ SOLR: 2.5000 10*6.[IU] | INTRAMUSCULAR | Status: DC

## 2017-06-22 MED ORDER — SOD CITRATE-CITRIC ACID 500-334 MG/5ML PO SOLN: 30.0000 mL | ORAL | Status: DC | PRN

## 2017-06-22 MED ORDER — PENICILLIN G POT IN DEXTROSE 60000 UNIT/ML IV SOLN
3.0000 10*6.[IU] | INTRAVENOUS | Status: DC
  Administered 2017-06-22 – 2017-06-23 (×3): 3 10*6.[IU] via INTRAVENOUS
  Filled 2017-06-22 (×5): qty 50

## 2017-06-22 MED ORDER — PENICILLIN G POTASSIUM 5000000 UNITS IJ SOLR
5.0000 10*6.[IU] | Freq: Once | INTRAMUSCULAR | Status: AC
  Administered 2017-06-22: 5 10*6.[IU] via INTRAVENOUS
  Filled 2017-06-22: qty 5

## 2017-06-22 MED ORDER — BETAMETHASONE SOD PHOS & ACET 6 (3-3) MG/ML IJ SUSP
12.0000 mg | Freq: Two times a day (BID) | INTRAMUSCULAR | Status: AC
  Administered 2017-06-22: 12 mg via INTRAMUSCULAR
  Filled 2017-06-22: qty 2

## 2017-06-22 MED ORDER — FENTANYL CITRATE (PF) 100 MCG/2ML IJ SOLN
100.0000 ug | INTRAMUSCULAR | Status: DC | PRN
  Administered 2017-06-22: 100 ug via INTRAVENOUS
  Filled 2017-06-22: qty 2

## 2017-06-22 MED ORDER — ACETAMINOPHEN 325 MG PO TABS
650.0000 mg | ORAL_TABLET | ORAL | Status: DC | PRN
Start: 2017-06-22 — End: 2017-06-23

## 2017-06-22 MED ORDER — LACTATED RINGERS IV SOLN
INTRAVENOUS | Status: DC
  Administered 2017-06-22: 09:00:00 via INTRAVENOUS

## 2017-06-22 MED ORDER — LIDOCAINE HCL (PF) 1 % IJ SOLN
30.0000 mL | INTRAMUSCULAR | Status: DC | PRN
  Filled 2017-06-22: qty 30

## 2017-06-22 MED ORDER — PENICILLIN G POT IN DEXTROSE 60000 UNIT/ML IV SOLN
3.0000 10*6.[IU] | INTRAVENOUS | Status: DC
  Administered 2017-06-22: 3 10*6.[IU] via INTRAVENOUS
  Filled 2017-06-22 (×4): qty 50

## 2017-06-22 MED ORDER — OXYCODONE-ACETAMINOPHEN 5-325 MG PO TABS: 1.0000 | ORAL_TABLET | ORAL | Status: DC | PRN

## 2017-06-22 NOTE — MAU Provider Note (Addendum)
History     CSN: [id number]  Arrival date and time: 06/22/17 0502   None     Chief Complaint  Patient presents with  . Contractions   HPI [name] is a 27yo [id number] @ 35.0 who presents for eval of ctx since 0400. Denies leaking or bldg. Her preg has been followed by the [facility] and has been remarkable for 1) preterm deliveries between 33-36wks  OB History    Gravida  5   Para  3   Term  0   Preterm  3   AB  1   Living  3     SAB  1   TAB  0   Ectopic  0   Multiple  0   Live Births  3           Past Medical History:  Diagnosis Date  . Anemia   . No pertinent past medical history   . Preterm delivery   . Stillbirth   . UTI (lower urinary tract infection)   . Vaginal wall cyst 01/02/2015   See photo in note from 01/01/15     Past Surgical History:  Procedure Laterality Date  . NO PAST SURGERIES      Family History  Problem Relation Age of Onset  . Hypertension Mother     Social History   Tobacco Use  . Smoking status: Never Smoker  . Smokeless tobacco: Never Used  Substance Use Topics  . Alcohol use: No  . Drug use: No    Allergies:  Allergies  Allergen Reactions  . Cherry Hives and Itching    Medications Prior to Admission  Medication Sig Dispense Refill Last Dose  . Multiple Vitamin (MULTIVITAMIN) tablet Take 1 tablet by mouth daily.   06/21/2017 at Unknown time  . NIFEdipine (PROCARDIA) 20 MG capsule Take 20 mg by mouth 3 (three) times daily.   06/21/2017 at Unknown time  . prenatal vitamin w/FE, FA (PRENATAL 1 + 1) 27-1 MG TABS tablet Take 1 tablet by mouth daily at 12 noon. 30 each 6 06/21/2017 at Unknown time  . ondansetron (ZOFRAN) 4 MG tablet Take 1 tablet (4 mg total) by mouth every 8 (eight) hours as needed for nausea or vomiting. 30 tablet 2 More than a month at Unknown time  . progesterone (PROMETRIUM) 200 MG capsule Place one capsule vaginally at bedtime 30 capsule 3 Taking    Review of Systems No other pertinents  other than what is listed in HPI  Physical Exam   Blood pressure 104/69, pulse 79, temperature 97.9 F (36.6 C), resp. rate 17, currently breastfeeding.  Physical Exam  Constitutional: She is oriented to person, place, and time. She appears well-developed.  HENT:  Head: Normocephalic.  Neck: Normal range of motion.  Cardiovascular: Normal rate.  Respiratory: Effort normal.  GI:  EFM initially with variables x 2 when monitor applied, now 140, +accels Ctx q 2-5 mins  Genitourinary:  Genitourinary Comments: Cx post/1/thick/vtx -3  Musculoskeletal: Normal range of motion.  Neurological: She is alert and oriented to person, place, and time.  Skin: Skin is warm and dry.  Psychiatric: She has a normal mood and affect. Her behavior is normal. Thought content normal.    MAU Course  Procedures  MDM UA pending  Assessment and Plan  IUP@ 35.0wks Preterm ctx  Care turned over to day shift provider  0815  Contractions continuing every 2-4 minutes - client unable to sleep - feeling contractions.  Cervix recheck -  now 2-3 cm, 50% effaced, -2 station, vertex - Will give betamethasone and admit. Report called to [facility], Resident.  Nolene Bernheim, RN, MSN, NP-BC Nurse Practitioner, [facility] for [employer], [facility] 06/22/2017 8:26 AM    Cam Hai CNM 06/22/2017, 5:25 AM

## 2017-06-22 NOTE — Progress Notes (Signed)
Patient ID: Mackenzie SpatzQuameca Lague, female   DOB: 04/29/1990, 27 y.o.   MRN: [id number]   Called to bedside by RN to evaluate pt report of increased back and abdominal pain and FHR decelerations.    S: Pt reports back pain is stronger than before, and she feels tightening in her abdomen that is the same as on admission.   O: Patient Vitals for the past 24 hrs:  BP Temp Temp src Pulse Resp Height Weight  06/22/17 1712 (!) 96/55 99.1 F (37.3 C) Axillary 70 16 - -  06/22/17 1533 (!) 105/57 (!) 97.4 F (36.3 C) Axillary 79 16 - -  06/22/17 1211 (!) 98/54 98.2 F (36.8 C) Oral 82 16 - -  06/22/17 0906 107/68 - - 69 16 - -  06/22/17 0851 (!) 93/46 98.5 F (36.9 C) Oral 75 16 5\' 3"  (1.6 m) 128 lb (58.1 kg)  06/22/17 0456 104/69 97.9 F (36.6 C) - 79 17 - -   VS reviewed, nursing note reviewed,  Constitutional: well developed, well nourished, no distress HEENT: normocephalic CV: normal rate Pulm/chest wall: normal effort Abdomen: soft Neuro: alert and oriented x 3 Skin: warm, dry Psych: affect normal  FHR tracing with baseline 130, moderate variability, positive accels, prolonged decel x 1, and variable x 1 in last 30 minutes Contractions every 2-6 minutes, mild to palpation  Cervix 4/50/-3, posterior on exam  A: Preterm labor with cervical change in last hour GBS unknown  P: [facility]  Restart PCN 2.5 millions units Q 4 hours for GBS prophylaxis Will continue to monitor closely  Sharen CounterLisa Leftwich-Kirby, CNM 6:44 PM

## 2017-06-22 NOTE — H&P (Addendum)
OBSTETRIC ADMISSION HISTORY AND PHYSICAL  [name] is a 27 y.o. female [phone] with IUP at [redacted]w[redacted]d by U/S presenting for preterm active labor. She reports +FMs, No LOF, no VB, no blurry vision, headaches or peripheral edema, and RUQ pain.  She plans on breast/Bottle feeding. She not sure what she wants to use for birth control. She received her prenatal care at [facility]   Dating: By U/S --->  Estimated Date of Delivery: 07/27/17  Sono:    @[redacted]w[redacted]d , CWD, normal anatomy, cephalic presentation, 1308g, 45% EFW   Prenatal History/Complications:  Past Medical History: Past Medical History:  Diagnosis Date  . Anemia   . No pertinent past medical history   . Preterm delivery   . Stillbirth   . UTI (lower urinary tract infection)   . Vaginal wall cyst 01/02/2015   See photo in note from 01/01/15     Past Surgical History: Past Surgical History:  Procedure Laterality Date  . NO PAST SURGERIES      Obstetrical History: OB History    Gravida  5   Para  3   Term  0   Preterm  3   AB  1   Living  3     SAB  1   TAB  0   Ectopic  0   Multiple  0   Live Births  3           Social History: Social History   Socioeconomic History  . Marital status: Single    Spouse name: Not on file  . Number of children: Not on file  . Years of education: Not on file  . Highest education level: Not on file  Occupational History  . Not on file  Social Needs  . Financial resource strain: Not on file  . Food insecurity:    Worry: Not on file    Inability: Not on file  . Transportation needs:    Medical: Not on file    Non-medical: Not on file  Tobacco Use  . Smoking status: Never Smoker  . Smokeless tobacco: Never Used  Substance and Sexual Activity  . Alcohol use: No  . Drug use: No  . Sexual activity: Yes    Birth control/protection: None    Comment: last intercourse Feb 03 2015  Lifestyle  . Physical activity:    Days per week: Not on file    Minutes per  session: Not on file  . Stress: Not on file  Relationships  . Social connections:    Talks on phone: Not on file    Gets together: Not on file    Attends religious service: Not on file    Active member of club or organization: Not on file    Attends meetings of clubs or organizations: Not on file    Relationship status: Not on file  Other Topics Concern  . Not on file  Social History Narrative  . Not on file    Family History: Family History  Problem Relation Age of Onset  . Hypertension Mother     Allergies: Allergies  Allergen Reactions  . Cherry Hives and Itching    Medications Prior to Admission  Medication Sig Dispense Refill Last Dose  . Multiple Vitamin (MULTIVITAMIN) tablet Take 1 tablet by mouth daily.   06/21/2017 at Unknown time  . NIFEdipine (PROCARDIA) 20 MG capsule Take 20 mg by mouth 3 (three) times daily.   06/21/2017 at Unknown time  . prenatal vitamin w/FE,  FA (PRENATAL 1 + 1) 27-1 MG TABS tablet Take 1 tablet by mouth daily at 12 noon. 30 each 6 06/21/2017 at Unknown time  . ondansetron (ZOFRAN) 4 MG tablet Take 1 tablet (4 mg total) by mouth every 8 (eight) hours as needed for nausea or vomiting. 30 tablet 2 More than a month at Unknown time  . progesterone (PROMETRIUM) 200 MG capsule Place one capsule vaginally at bedtime (Patient not taking: Reported on 06/22/2017) 30 capsule 3 Not Taking at Unknown time     Review of Systems   All systems reviewed and negative except as stated in HPI  Blood pressure 104/69, pulse 79, temperature 97.9 F (36.6 C), resp. rate 17, currently breastfeeding. General appearance: alert and cooperative Lungs: clear to auscultation bilaterally Heart: regular rate and rhythm Abdomen: soft, non-tender; bowel sounds normal Extremities: Homans sign is negative, no sign of DVT DTR's normal  Presentation: unsure Fetal monitoringBaseline: 115 bpm, Variability: Good {> 6 bpm), Accelerations: Reactive and Decelerations:  Absent Uterine activityFrequency: Every 2 minutes and Duration: 30 seconds Pelvic: Dilation: 2.5 Effacement (%): 50 Station: -2 Exam by:: Lilyan Punt Burleson NP   Prenatal labs: ABO, Rh: AB/Positive/-- (01/28 1146) Antibody: Negative (01/28 1146) Rubella: 1.64 (01/28 1146) RPR: Non Reactive (01/28 1146)  HBsAg: Negative (01/28 1146)  HIV: Non Reactive (01/28 1146)  GBS:  Unknown 1 hr Glucola Not done  Genetic screening  Declined  Anatomy US Normal   Prenatal Transfer Tool  Maternal Diabetes: Unknown Hgb A1c ordered  Genetic Screening: Declined Maternal Ultrasounds/Referrals: Normal Fetal Ultrasounds or other Referrals:  None Maternal Substance Abuse:  No Significant Maternal Medications:  None Significant Maternal Lab Results: None  Results for orders placed or performed during the hospital encounter of 06/22/17 (from the past 24 hour(s))  Urinalysis, Routine w reflex microscopic   Collection Time: 06/22/17  5:15 AM  Result Value Ref Range   Color, Urine YELLOW YELLOW   APPearance HAZY (A) CLEAR   Specific Gravity, Urine 1.004 (L) 1.005 - 1.030   pH 6.0 5.0 - 8.0   Glucose, UA NEGATIVE NEGATIVE mg/dL   Hgb urine dipstick NEGATIVE NEGATIVE   Bilirubin Urine NEGATIVE NEGATIVE   Ketones, ur NEGATIVE NEGATIVE mg/dL   Protein, ur NEGATIVE NEGATIVE mg/dL   Nitrite NEGATIVE NEGATIVE   Leukocytes, UA LARGE (A) NEGATIVE   RBC / HPF 0-5 0 - 5 RBC/hpf   WBC, UA TOO NUMEROUS TO COUNT 0 - 5 WBC/hpf   Bacteria, UA MANY (A) NONE SEEN   Squamous Epithelial / LPF 0-5 (A) NONE SEEN   Mucus PRESENT     Patient Active Problem List   Diagnosis Date Noted  . Preterm labor 06/22/2017  . Supervision of high risk pregnancy, antepartum 04/04/2017  . History of preterm delivery, currently pregnant, unspecified trimester 04/04/2017    Assessment/Plan:  [name] is a 27 y.o. [id number] at 7454w0d here for Preterm active labor.  #Labor: Preterm, spontaneous progressing well  #Pain: IV pain  meds #FWB: Category 1  #ID:  Unknown GBS #MOF: Breast and Bottle feeding  #MOC:Unsure    Beverly SessionsGrant G Davidson, Student-PA  06/22/2017, 9:06 AM   Midwife attestation: I have seen and examined this patient; I agree with above documentation in the PA student's note.   [name] is a 27 y.o. [id number] here for preterm labor with hx of preterm vaginal delivery x 3. She was late to care with this pregnancy and is not on 17-P.  PE: BP 107/68   Pulse 69  Temp 98.5 F (36.9 C) (Oral)   Resp 16   Ht 5\' 3"  (1.6 m)   Wt 128 lb (58.1 kg)   BMI 22.67 kg/m  Gen: calm comfortable, NAD Resp: normal effort, no distress Abd: gravid  ROS, labs, PMH reviewed  Plan: Admit to LD Labor: expectant management at 35 weeks, with the addition of IV fluids, BMZ on admission, second dose in 24 hours if undelivered FWB: Baseline 135 with moderate variability, positive accels, rare intermittent variables, but overall Category I ID: GBS unknown, prophylaxis with PCN since pt is preterm, GBS culture obtained on admission  Sharen Counter, CNM  06/22/2017, 10:02 AM

## 2017-06-22 NOTE — MAU Note (Signed)
Pt presents to MAU c/o Preterm ctxs that started yesterday afternoon they went away and then her ctxs came back around 0400. Pt reports taking a procardia before bed at 2330. No bleeding no leaking and +FM.

## 2017-06-22 NOTE — Anesthesia Pain Management Evaluation Note (Addendum)
  [facility] Pain Management Visit Note  Patient: [name], 27 y.o., female  "Hello I am a member of the anesthesia team at [facility]. We have an anesthesia team available at all times to provide care throughout the hospital, including epidural management and anesthesia for C-section. I don't know your plan for the delivery whether it a natural birth, water birth, IV sedation, nitrous supplementation, doula or epidural, but we want to meet your pain goals."   1.Was your pain managed to your expectations on prior hospitalizations?   yes 2.What is your expectation for pain management during this hospitalization?    IV pain medications  3.How can we help you reach that goal?Nursing intervinetions..  Record the patient's initial score and the patient's pain goal.   Pain: 8.  Pain Goal: 6 [facility] wants you to be able to say your pain was always managed very well.  [name] 06/22/2017

## 2017-06-22 NOTE — Progress Notes (Signed)
[name] is a 27 y.o. [id number] at 8880w0d by 21 week ultrasound admitted for Preterm labor  Subjective: Pt resting in bed, reports intermittent cramping, unchanged in intensity in last couple of hours.  Last dose of IV pain medication 3+ hours ago.  Objective: BP (!) 105/57   Pulse 79   Temp (!) 97.4 F (36.3 C) (Axillary)   Resp 16   Ht 5\' 3"  (1.6 m)   Wt 128 lb (58.1 kg)   BMI 22.67 kg/m  No intake/output data recorded. No intake/output data recorded.  FHT:  FHR: 125 bpm, variability: moderate,  accelerations:  Present,  decelerations:  Present isolated variables UC:   irregular, every 1-6 minutes, mild to palpation SVE:   Dilation: 3 Effacement (%): 50 Station: Ballotable Exam by:: L Leftwich Kirby  Labs: Lab Results  Component Value Date   WBC 8.9 06/22/2017   HGB 9.6 (L) 06/22/2017   HCT 30.5 (L) 06/22/2017   MCV 79.2 06/22/2017   PLT 271 06/22/2017    Assessment / Plan: Threatened preterm labor  GBS unknown   Labor: No evidence of cervical change in 5-6 hours.  With no active preterm labor, will transfer pt to HROB Unit for continued observation. Preeclampsia:  n/a Fetal Wellbeing:  Category I Pain Control:  Labor support without medications I/D:  GBS unknown on PCN Anticipated MOD:  NSVD  Sharen CounterLisa Leftwich-Kirby 06/22/2017, 4:13 PM

## 2017-06-22 NOTE — Progress Notes (Signed)
[name] is a 27 y.o. [id number] at 8233w0d by ultrasound admitted for Preterm labor  Subjective:  Patient's contractions have been more intense, but pain is well controlled on IV pain PRN. Patient has no complaints. Patient has family member in room for support.   Objective: BP (!) 98/54   Pulse 82   Temp 98.5 F (36.9 C) (Oral)   Resp 16   Ht 5\' 3"  (1.6 m)   Wt 58.1 kg (128 lb)   BMI 22.67 kg/m  No intake/output data recorded. No intake/output data recorded.  FHT:  FHR: 125 bpm, variability: moderate,  accelerations:  Abscent,  decelerations:  Absent UC:   regular, every 2 minutes SVE:   Dilation: 3 Effacement (%): 70 Station: -2 Exam by:: Lorn Junes. Goodman, RN  Labs: Lab Results  Component Value Date   WBC 8.9 06/22/2017   HGB 9.6 (L) 06/22/2017   HCT 30.5 (L) 06/22/2017   MCV 79.2 06/22/2017   PLT 271 06/22/2017    Assessment / Plan: Labor: expectant management at 35 weeks, with the addition of IV fluids, BMZ on admission, second dose in 24 hours if undelivered  Labor: Progressing normally Preeclampsia:  labs stable Fetal Wellbeing:  Category I Pain Control:  IV pain meds I/D:  GBS unknow, prophylaxis with PCN Anticipated MOD:  NSVD  Beverly SessionsGrant G Ollin Hochmuth 06/22/2017, 12:51 PM

## 2017-06-23 LAB — RPR: RPR: NONREACTIVE

## 2017-06-23 NOTE — Progress Notes (Signed)
Labor Progress Note  Magdalen SpatzQuameca Haas is a 27 y.o. [id number] at 1513w1d  admitted for Preterm labor  S: Resting comfortably s/p medicine.  O:  BP (!) 95/53   Pulse (!) 107   Temp 97.9 F (36.6 C) (Oral)   Resp 18   Ht 5\' 3"  (1.6 m)   Wt 54.2 kg (119 lb 6.4 oz)   BMI 21.15 kg/m   No intake/output data recorded.  FHT:  FHR: 125 bpm, variability: moderate,  accelerations:  Present,  decelerations:  Absent UC:  UI, occ contraction SVE:   Dilation: 4 Effacement (%): 50 Station: -3 Exam by:: L. Courtney ParisLeftwich Kirby, CNM  Labs: Lab Results  Component Value Date   WBC 8.9 06/22/2017   HGB 9.6 (L) 06/22/2017   HCT 30.5 (L) 06/22/2017   MCV 79.2 06/22/2017   PLT 271 06/22/2017    Assessment / Plan: 27 y.o. [id number] 2913w1d. Not in labor currently  Labor: not in labor currently. Continue to monitor. Recheck as indicated.  Fetal Wellbeing:  Category I Pain Control:  IV pain meds   Caryl AdaJazma Phelps, DO OB Fellow Center for [facility], [facility]

## 2017-06-23 NOTE — Discharge Summary (Signed)
DISCHARGE NOTE  [name] is a 27 y.o. [id number] at 5519w1d  admitted for Preterm labor on 06/22/2017. With cervical change from 3cm to 4cm. Hx of 3 PTD at 33,34, and 36 weeks.    Subjective: Patient reports being comfortable having some contractions every 7 minutes since being awake at 0700, she reports sleeping through the night.   Objective: BP (!) 98/57   Pulse 77   Temp 97.6 F (36.4 C) (Oral)   Resp 16   Ht 5\' 3"  (1.6 m)   Wt 119 lb 6.4 oz (54.2 kg)   BMI 21.15 kg/m  or  Vitals:   06/22/17 2333 06/23/17 0648 06/23/17 0741 06/23/17 0844  BP: (!) 95/53 (!) 84/41 105/64 (!) 98/57  Pulse: (!) 107 86 84 77  Resp: 18 16 16 16   Temp: 97.9 F (36.6 C) (!) 97.5 F (36.4 C) 97.6 F (36.4 C)   TempSrc: Oral Oral Oral   Weight:      Height:        Same dilation after rechecking after over 12 hours Dilation: 4 Effacement (%): 50 Cervical Position: Posterior Station: -2 Presentation: Vertex Exam by:: Leigha Olberding, cnm FHT: baseline rate 130, moderate varibility, +acel, no decel Toco: occasional mild contractions   Labs: Lab Results  Component Value Date   WBC 8.9 06/22/2017   HGB 9.6 (L) 06/22/2017   HCT 30.5 (L) 06/22/2017   MCV 79.2 06/22/2017   PLT 271 06/22/2017    Patient Active Problem List   Diagnosis Date Noted  . Preterm labor 06/22/2017  . Supervision of high risk pregnancy, antepartum 04/04/2017  . History of preterm delivery, currently pregnant, unspecified trimester 04/04/2017    Assessment / Plan: 27 y.o. [id number] at 2719w1d here for Preterm labor. Patient discharged in stable condition without cervical change overnight. Patient scheduled for high risk OB follow up in office. Discussed reasons to return to MAU with increased contractions, vaginal bleeding or rupture of membranes. Discussed with the patient and all questioned fully answered. She will call office or return to MAU if any problems arise.  Fetal Wellbeing:  Cat I  Follow-up Information     [facility] Schedule an appointment as soon as possible for a visit.   Specialty:  Obstetrics and Gynecology Why:  Make appointment to be seen Monday April 22nd  Contact information: [address] [place] [id number] [phone]          Allergies as of 06/23/2017      Reactions   Cherry Hives, Itching      Medication List    TAKE these medications   multivitamin tablet Take 1 tablet by mouth daily.   NIFEdipine 20 MG capsule Commonly known as:  PROCARDIA Take 20 mg by mouth 3 (three) times daily.   ondansetron 4 MG tablet Commonly known as:  ZOFRAN Take 1 tablet (4 mg total) by mouth every 8 (eight) hours as needed for nausea or vomiting.   prenatal vitamin w/FE, FA 27-1 MG Tabs tablet Take 1 tablet by mouth daily at 12 noon.   progesterone 200 MG capsule Commonly known as:  PROMETRIUM Place one capsule vaginally at bedtime       Sharyon CableRogers, Donabelle Molden C, CNM 06/23/2017, 9:12 AM

## 2017-06-24 LAB — CULTURE, BETA STREP (GROUP B ONLY)

## 2017-06-25 ENCOUNTER — Inpatient Hospital Stay (HOSPITAL_COMMUNITY)
Admission: AD | Admit: 2017-06-25 | Discharge: 2017-06-25 | Disposition: A | Discharge status: Home / Self Care | Payer: Medicaid Other | Source: Ambulatory Visit | Attending: Family Medicine | Admitting: Family Medicine

## 2017-06-25 ENCOUNTER — Other Ambulatory Visit: Payer: Self-pay

## 2017-06-25 ENCOUNTER — Encounter (HOSPITAL_COMMUNITY): Payer: Self-pay

## 2017-06-25 DIAGNOSIS — Z3A35 35 weeks gestation of pregnancy: Secondary | ICD-10-CM | POA: Diagnosis not present

## 2017-06-25 DIAGNOSIS — O2343 Unspecified infection of urinary tract in pregnancy, third trimester: Secondary | ICD-10-CM | POA: Insufficient documentation

## 2017-06-25 DIAGNOSIS — O26893 Other specified pregnancy related conditions, third trimester: Secondary | ICD-10-CM | POA: Diagnosis present

## 2017-06-25 DIAGNOSIS — O4703 False labor before 37 completed weeks of gestation, third trimester: Secondary | ICD-10-CM | POA: Diagnosis not present

## 2017-06-25 DIAGNOSIS — O09213 Supervision of pregnancy with history of pre-term labor, third trimester: Secondary | ICD-10-CM | POA: Diagnosis not present

## 2017-06-25 DIAGNOSIS — O364XX0 Maternal care for intrauterine death, not applicable or unspecified: Secondary | ICD-10-CM | POA: Insufficient documentation

## 2017-06-25 LAB — URINALYSIS, ROUTINE W REFLEX MICROSCOPIC
Bilirubin Urine: NEGATIVE
Glucose, UA: 50 mg/dL — AB
Hgb urine dipstick: NEGATIVE
KETONES UR: NEGATIVE mg/dL
Nitrite: POSITIVE — AB
PH: 8 (ref 5.0–8.0)
PROTEIN: NEGATIVE mg/dL
Specific Gravity, Urine: 1.008 (ref 1.005–1.030)

## 2017-06-25 MED ORDER — CEPHALEXIN 500 MG PO CAPS
500.0000 mg | ORAL_CAPSULE | Freq: Four times a day (QID) | ORAL | 0 refills | Status: DC
Start: 2017-06-25 — End: 2017-07-12

## 2017-06-25 MED ORDER — CEPHALEXIN 500 MG PO CAPS
500.0000 mg | ORAL_CAPSULE | Freq: Once | ORAL | Status: AC
  Administered 2017-06-25: 500 mg via ORAL
  Filled 2017-06-25: qty 1

## 2017-06-25 NOTE — MAU Note (Signed)
Pt presents to MAU c/o ctx 2-293min. No bleeding or leaking. +FM. Pt reports she was 4cm last time she was checked. Pt was previously admitted to  L&D for ctxs. Pt reports the pain 8/10.

## 2017-06-25 NOTE — Progress Notes (Addendum)
G5P3 @ 35.[redacted] wksga. Here for r/o labor. States been ctx since 1800 this evening. Denies LOF or bleeding.   EFM applied   2115: provider at bs assessing.  VE 4/70/-2 vertex  2205: new orders received for keflex. Pharmacy notified. Will tube med.   2210: relinquished care over to RN Traci.

## 2017-06-25 NOTE — Discharge Instructions (Signed)
Braxton Hicks Contractions °Contractions of the uterus can occur throughout pregnancy, but they are not always a sign that you are in labor. You may have practice contractions called Braxton Hicks contractions. These false labor contractions are sometimes confused with true labor. °What are Braxton Hicks contractions? °Braxton Hicks contractions are tightening movements that occur in the muscles of the uterus before labor. Unlike true labor contractions, these contractions do not result in opening (dilation) and thinning of the cervix. Toward the end of pregnancy (32-34 weeks), Braxton Hicks contractions can happen more often and may become stronger. These contractions are sometimes difficult to tell apart from true labor because they can be very uncomfortable. You should not feel embarrassed if you go to the hospital with false labor. °Sometimes, the only way to tell if you are in true labor is for your health care provider to look for changes in the cervix. The health care provider will do a physical exam and may monitor your contractions. If you are not in true labor, the exam should show that your cervix is not dilating and your water has not broken. °If there are other health problems associated with your pregnancy, it is completely safe for you to be sent home with false labor. You may continue to have Braxton Hicks contractions until you go into true labor. °How to tell the difference between true labor and false labor °True labor °· Contractions last 30-70 seconds. °· Contractions become very regular. °· Discomfort is usually felt in the top of the uterus, and it spreads to the lower abdomen and low back. °· Contractions do not go away with walking. °· Contractions usually become more intense and increase in frequency. °· The cervix dilates and gets thinner. °False labor °· Contractions are usually shorter and not as strong as true labor contractions. °· Contractions are usually irregular. °· Contractions  are often felt in the front of the lower abdomen and in the groin. °· Contractions may go away when you walk around or change positions while lying down. °· Contractions get weaker and are shorter-lasting as time goes on. °· The cervix usually does not dilate or become thin. °Follow these instructions at home: °· Take over-the-counter and prescription medicines only as told by your health care provider. °· Keep up with your usual exercises and follow other instructions from your health care provider. °· Eat and drink lightly if you think you are going into labor. °· If Braxton Hicks contractions are making you uncomfortable: °? Change your position from lying down or resting to walking, or change from walking to resting. °? Sit and rest in a tub of warm water. °? Drink enough fluid to keep your urine pale yellow. Dehydration may cause these contractions. °? Do slow and deep breathing several times an hour. °· Keep all follow-up prenatal visits as told by your health care provider. This is important. °Contact a health care provider if: °· You have a fever. °· You have continuous pain in your abdomen. °Get help right away if: °· Your contractions become stronger, more regular, and closer together. °· You have fluid leaking or gushing from your vagina. °· You pass blood-tinged mucus (bloody show). °· You have bleeding from your vagina. °· You have low back pain that you never had before. °· You feel your baby’s head pushing down and causing pelvic pressure. °· Your baby is not moving inside you as much as it used to. °Summary °· Contractions that occur before labor are called Braxton   Hicks contractions, false labor, or practice contractions.  Braxton Hicks contractions are usually shorter, weaker, farther apart, and less regular than true labor contractions. True labor contractions usually become progressively stronger and regular and they become more frequent.  [facility]age [facility] from [facility] by  changing position, resting in a warm bath, drinking plenty of water, or practicing deep breathing. This information is not intended to replace advice given to you by your health care provider. Make sure you discuss any questions you have with your health care provider. Document Released: 07/08/2016 Document Revised: 07/08/2016 Document Reviewed: 07/08/2016 Elsevier Interactive Patient Education  2018 Else[employer] Inc.  Pregnancy and Urinary Tract Infection What is a urinary tract infection? A urinary tract infection (UTI) is an infection of any part of the urinary tract. This includes the kidneys, the tubes that connect your kidneys to your bladder (ureters), the bladder, and the tube that carries urine out of your body (urethra). These organs make, store, and get rid of urine in the body. A UTI can be a bladder infection (cystitis) or a kidney infection (pyelonephritis). This infection may be caused by fungi, viruses, and bacteria. Bacteria are the most common cause of UTIs. You are more likely to develop a UTI during pregnancy because:  The physical and hormonal changes your body goes through can make it easier for bacteria to get into your urinary tract.  Your growing baby puts pressure on your uterus and can affect urine flow.  Does a UTI place my baby at risk? An untreated UTI during pregnancy could lead to a kidney infection, which can cause health problems that could affect your baby. Possible complications of an untreated UTI include:  Having your baby before 37 weeks of pregnancy (premature).  Having a baby with a low birth weight.  Developing high blood pressure during pregnancy (preeclampsia).  What are the symptoms of a UTI? Symptoms of a UTI include:  Fever.  Frequent urination or passing small amounts of urine frequently.  Needing to urinate urgently.  Pain or a burning sensation with urination.  Urine that smells bad or unusual.  Cloudy urine.  Pain in the lower  abdomen or back.  Trouble urinating.  Blood in the urine.  Vomiting or being less hungry than normal.  Diarrhea or abdominal pain.  Vaginal discharge.  What are the treatment options for a UTI during pregnancy? Treatment for this condition may include:  Antibiotic medicines that are safe to take during pregnancy.  Other medicines to treat less common causes of UTI.  How can I prevent a UTI?  To prevent a UTI:  Go to the bathroom as soon as you feel the need.  Always wipe from front to back.  Wash your genital area with soap and warm water daily.  Empty your bladder before and after sex.  Wear cotton underwear.  Limit your intake of high sugar foods or drinks, such as regular soda, juice, and sweets.  Drink 6-8 glasses of water daily.  Do not wear tight-fitting pants.  Do not douche or use deodorant sprays.  Do not drink alcohol, caffeine, or carbonated drinks. These can irritate the bladder.  Contact a health care provider if:  Your symptoms do not improve or get worse.  You have a fever after two days of treatment.  You have a rash.  You have abnormal vaginal discharge.  You have back or side pain.  You have chills.  You have nausea and vomiting. Get help right away if: Seek  immediate medical care if you are pregnant and:  You feel contractions in your uterus.  You have lower belly pain.  You have a gush of fluid from your vagina.  You have blood in your urine.  You are vomiting and cannot keep down any medicines or water.  This information is not intended to replace advice given to you by your health care provider. Make sure you discuss any questions you have with your health care provider. Document Released: 06/19/2010 Document Revised: 02/06/2016 Document Reviewed: 01/13/2015 Elsevier Interactive Patient Education  2017 [employer].

## 2017-06-25 NOTE — MAU Provider Note (Signed)
History     CSN: [id number]  Arrival date and time: 06/25/17 2046   First Provider Initiated Contact with Patient 06/25/17 2115      Chief Complaint  Patient presents with  . Contractions   HPI   Ms.[name] is a 27 y.o. female [phone] @ [redacted]w[redacted]d here in MAU with contractions. She has a significant history of preterm deliveries. She was here on 4/17 and was admitted for threatened preterm delivery. She was sent home on 4/18. Says tonight around 1800 her contractions became stronger and closer together. No bleeding or leaking of water, + fetal movement. Received betamethasone on 4/17 & 4/18.   OB History    Gravida  5   Para  3   Term  0   Preterm  3   AB  1   Living  3     SAB  1   TAB  0   Ectopic  0   Multiple  0   Live Births  3           Past Medical History:  Diagnosis Date  . Anemia    iron   . No pertinent past medical history   . Preterm delivery   . Stillbirth   . UTI (lower urinary tract infection)   . Vaginal wall cyst 01/02/2015   See photo in note from 01/01/15     Past Surgical History:  Procedure Laterality Date  . NO PAST SURGERIES      Family History  Problem Relation Age of Onset  . Hypertension Mother     Social History   Tobacco Use  . Smoking status: Never Smoker  . Smokeless tobacco: Never Used  Substance Use Topics  . Alcohol use: No  . Drug use: No    Allergies:  Allergies  Allergen Reactions  . Cherry Hives and Itching    Medications Prior to Admission  Medication Sig Dispense Refill Last Dose  . Multiple Vitamin (MULTIVITAMIN) tablet Take 1 tablet by mouth daily.   06/21/2017 at Unknown time  . NIFEdipine (PROCARDIA) 20 MG capsule Take 20 mg by mouth 3 (three) times daily.   06/21/2017 at Unknown time  . ondansetron (ZOFRAN) 4 MG tablet Take 1 tablet (4 mg total) by mouth every 8 (eight) hours as needed for nausea or vomiting. 30 tablet 2 More than a month at Unknown time  . prenatal vitamin w/FE, FA  (PRENATAL 1 + 1) 27-1 MG TABS tablet Take 1 tablet by mouth daily at 12 noon. 30 each 6 06/21/2017 at Unknown time  . progesterone (PROMETRIUM) 200 MG capsule Place one capsule vaginally at bedtime (Patient not taking: Reported on 06/22/2017) 30 capsule 3 Not Taking at Unknown time   Results for orders placed or performed during the hospital encounter of 06/25/17 (from the past 48 hour(s))  Urinalysis, Routine w reflex microscopic     Status: Abnormal   Collection Time: 06/25/17  8:46 PM  Result Value Ref Range   Color, Urine YELLOW YELLOW   APPearance HAZY (A) CLEAR   Specific Gravity, Urine 1.008 1.005 - 1.030   pH 8.0 5.0 - 8.0   Glucose, UA 50 (A) NEGATIVE mg/dL   Hgb urine dipstick NEGATIVE NEGATIVE   Bilirubin Urine NEGATIVE NEGATIVE   Ketones, ur NEGATIVE NEGATIVE mg/dL   Protein, ur NEGATIVE NEGATIVE mg/dL   Nitrite POSITIVE (A) NEGATIVE   Leukocytes, UA LARGE (A) NEGATIVE   RBC / HPF 0-5 0 - 5 RBC/hpf   WBC,  UA TOO NUMEROUS TO COUNT 0 - 5 WBC/hpf   Bacteria, UA FEW (A) NONE SEEN   Squamous Epithelial / LPF 0-5 (A) NONE SEEN    Comment: Performed at [facility], [address] [place], [place] [address]    Review of Systems  Gastrointestinal: Positive for abdominal pain. Negative for nausea and vomiting.  Genitourinary: Positive for frequency and urgency. Negative for dysuria and flank pain.  Musculoskeletal: Positive for back pain.   Physical Exam   Blood pressure 114/66, pulse 86, temperature 97.6 F (36.4 C), temperature source Oral, resp. rate 18, height 5\' 3"  (1.6 m), weight 123 lb 1.9 oz (55.8 kg), currently breastfeeding.  Physical Exam  Constitutional: She is oriented to person, place, and time. She appears well-developed and well-nourished. No distress.  HENT:  Head: Normocephalic.  Eyes: Pupils are equal, round, and reactive to light.  GI: Soft. Normal appearance. There is no tenderness. There is no CVA tenderness.  Genitourinary:  Genitourinary  Comments: Dilation: 4 Effacement (%): 70 Station: -2 Presentation: Vertex Exam by:: Shela CommonsJ. Warrene Kapfer NP  Musculoskeletal: Normal range of motion.  Neurological: She is alert and oriented to person, place, and time.  Skin: Skin is warm. She is not diaphoretic.  Psychiatric: Her behavior is normal.   Fetal Tracing: Baseline: 125 bpm Variability: Moderate  Accelerations: 15x15 Decelerations: None Toco: Irregular pattern   MAU Course  Procedures  None  MDM  UA with + nitrites, Keflex started in MAU 500 mg PO Urine culture sent  No CVA tenderness, no fever. Cervix unchanged after 1.5 hours. Patient is resting in the bed.   Assessment and Plan   A:  1. Threatened premature labor in third trimester   2. UTI in pregnancy, third trimester   3. [redacted] weeks gestation of pregnancy     P;  Discharge home with strict return precautions Pyelo precautions Rx: Keflex Return to MAU if symptoms worsen  Preterm labor precautions Pelvic rest  Adolphus Hanf, Harolyn RutherfordJennifer I, NP 06/26/2017 12:06 AM

## 2017-06-28 LAB — CULTURE, OB URINE: SPECIAL REQUESTS: NORMAL

## 2017-06-30 ENCOUNTER — Inpatient Hospital Stay (HOSPITAL_COMMUNITY)
Admission: AD | Admit: 2017-06-30 | Discharge: 2017-07-01 | Disposition: A | Discharge status: Home / Self Care | Payer: Medicaid Other | Source: Ambulatory Visit | Attending: Obstetrics & Gynecology | Admitting: Obstetrics & Gynecology

## 2017-06-30 ENCOUNTER — Encounter (HOSPITAL_COMMUNITY): Payer: Self-pay | Admitting: *Deleted

## 2017-06-30 DIAGNOSIS — O09899 Supervision of other high risk pregnancies, unspecified trimester: Secondary | ICD-10-CM

## 2017-06-30 DIAGNOSIS — O0990 Supervision of high risk pregnancy, unspecified, unspecified trimester: Secondary | ICD-10-CM

## 2017-06-30 DIAGNOSIS — O479 False labor, unspecified: Secondary | ICD-10-CM

## 2017-06-30 DIAGNOSIS — O471 False labor at or after 37 completed weeks of gestation: Secondary | ICD-10-CM | POA: Insufficient documentation

## 2017-06-30 DIAGNOSIS — Z3A37 37 weeks gestation of pregnancy: Secondary | ICD-10-CM | POA: Insufficient documentation

## 2017-06-30 DIAGNOSIS — O09219 Supervision of pregnancy with history of pre-term labor, unspecified trimester: Secondary | ICD-10-CM

## 2017-06-30 NOTE — MAU Note (Signed)
PT SAYS  SHE WAS HERE  Sunday- GOT ADMITTED - D/C HOME ON  MON/ Tuesday.   GOT 17-P SHOT.    [facility] STR[facility] SINCE 1010PM-  [facility]   LAST SEX-  2 WEEKS AGO.

## 2017-07-01 DIAGNOSIS — O471 False labor at or after 37 completed weeks of gestation: Secondary | ICD-10-CM | POA: Diagnosis not present

## 2017-07-01 DIAGNOSIS — Z3A37 37 weeks gestation of pregnancy: Secondary | ICD-10-CM | POA: Diagnosis not present

## 2017-07-01 NOTE — Discharge Instructions (Signed)
Braxton Hicks Contractions °Contractions of the uterus can occur throughout pregnancy, but they are not always a sign that you are in labor. You may have practice contractions called Braxton Hicks contractions. These false labor contractions are sometimes confused with true labor. °What are Braxton Hicks contractions? °Braxton Hicks contractions are tightening movements that occur in the muscles of the uterus before labor. Unlike true labor contractions, these contractions do not result in opening (dilation) and thinning of the cervix. Toward the end of pregnancy (32-34 weeks), Braxton Hicks contractions can happen more often and may become stronger. These contractions are sometimes difficult to tell apart from true labor because they can be very uncomfortable. You should not feel embarrassed if you go to the hospital with false labor. °Sometimes, the only way to tell if you are in true labor is for your health care provider to look for changes in the cervix. The health care provider will do a physical exam and may monitor your contractions. If you are not in true labor, the exam should show that your cervix is not dilating and your water has not broken. °If there are other health problems associated with your pregnancy, it is completely safe for you to be sent home with false labor. You may continue to have Braxton Hicks contractions until you go into true labor. °How to tell the difference between true labor and false labor °True labor °· Contractions last 30-70 seconds. °· Contractions become very regular. °· Discomfort is usually felt in the top of the uterus, and it spreads to the lower abdomen and low back. °· Contractions do not go away with walking. °· Contractions usually become more intense and increase in frequency. °· The cervix dilates and gets thinner. °False labor °· Contractions are usually shorter and not as strong as true labor contractions. °· Contractions are usually irregular. °· Contractions  are often felt in the front of the lower abdomen and in the groin. °· Contractions may go away when you walk around or change positions while lying down. °· Contractions get weaker and are shorter-lasting as time goes on. °· The cervix usually does not dilate or become thin. °Follow these instructions at home: °· Take over-the-counter and prescription medicines only as told by your health care provider. °· Keep up with your usual exercises and follow other instructions from your health care provider. °· Eat and drink lightly if you think you are going into labor. °· If Braxton Hicks contractions are making you uncomfortable: °? Change your position from lying down or resting to walking, or change from walking to resting. °? Sit and rest in a tub of warm water. °? Drink enough fluid to keep your urine pale yellow. Dehydration may cause these contractions. °? Do slow and deep breathing several times an hour. °· Keep all follow-up prenatal visits as told by your health care provider. This is important. °Contact a health care provider if: °· You have a fever. °· You have continuous pain in your abdomen. °Get help right away if: °· Your contractions become stronger, more regular, and closer together. °· You have fluid leaking or gushing from your vagina. °· You pass blood-tinged mucus (bloody show). °· You have bleeding from your vagina. °· You have low back pain that you never had before. °· You feel your baby’s head pushing down and causing pelvic pressure. °· Your baby is not moving inside you as much as it used to. °Summary °· Contractions that occur before labor are called Braxton   Hicks contractions, false labor, or practice contractions. °· Braxton Hicks contractions are usually shorter, weaker, farther apart, and less regular than true labor contractions. True labor contractions usually become progressively stronger and regular and they become more frequent. °· Manage discomfort from Braxton Hicks contractions by  changing position, resting in a warm bath, drinking plenty of water, or practicing deep breathing. °This information is not intended to replace advice given to you by your health care provider. Make sure you discuss any questions you have with your health care provider. °Document Released: 07/08/2016 Document Revised: 07/08/2016 Document Reviewed: 07/08/2016 °Elsevier Interactive Patient Education © 2018 Elsevier Inc. ° °

## 2017-07-01 NOTE — MAU Note (Signed)
I have communicated with Konrad FelixFran Cres-Dish, CNM  and reviewed vital signs:  Vitals:   06/30/17 2329 07/01/17 0034  BP: 105/62 (!) 99/59  Pulse: (!) 118 91  Resp: 20   Temp: 98.4 F (36.9 C)     Vaginal exam:  Dilation: 4 Effacement (%): 50 Station: -2 Presentation: Vertex Exam by:: [employer] Wealthy Danielski, RN,   Also reviewed contraction pattern and that non-stress test is reactive.  It has been documented that patient is contracting every 5-6 minutes with no cervical change from the pt's previous cervical exam not indicating active labor.  Patient denies any other complaints.  Based on this report provider has given order for discharge.  A discharge order and diagnosis entered by a provider.   Labor discharge instructions reviewed with patient.

## 2017-07-06 ENCOUNTER — Encounter: Payer: Self-pay | Admitting: Obstetrics and Gynecology

## 2017-07-06 ENCOUNTER — Ambulatory Visit (INDEPENDENT_AMBULATORY_CARE_PROVIDER_SITE_OTHER): Payer: Medicaid Other | Admitting: Obstetrics and Gynecology

## 2017-07-06 VITALS — BP 114/65 | HR 85 | Wt 117.9 lb

## 2017-07-06 DIAGNOSIS — O0930 Supervision of pregnancy with insufficient antenatal care, unspecified trimester: Secondary | ICD-10-CM | POA: Insufficient documentation

## 2017-07-06 DIAGNOSIS — O0933 Supervision of pregnancy with insufficient antenatal care, third trimester: Secondary | ICD-10-CM

## 2017-07-06 DIAGNOSIS — D649 Anemia, unspecified: Secondary | ICD-10-CM | POA: Insufficient documentation

## 2017-07-06 DIAGNOSIS — O0990 Supervision of high risk pregnancy, unspecified, unspecified trimester: Secondary | ICD-10-CM

## 2017-07-06 MED ORDER — FERROUS GLUCONATE 324 (38 FE) MG PO TABS: 324.0000 mg | ORAL_TABLET | Freq: Two times a day (BID) | ORAL | 1 refills | Status: DC

## 2017-07-06 NOTE — Progress Notes (Signed)
Prenatal Visit Note Date: 07/06/2017 Clinic: [facility]  Subjective:  [name] is a 27 y.o. [id number] at [redacted]w[redacted]d being seen today for ongoing prenatal care.  She is currently monitored for the following issues for this high-risk pregnancy and has UTI in pregnancy, antepartum, third trimester; Supervision of high risk pregnancy, antepartum; History of preterm delivery, currently pregnant, unspecified trimester; Preterm labor; Preterm labor in third trimester without delivery; and Anemia on their problem list.  Patient reports no complaints.   Contractions: Irregular. Vag. Bleeding: None.  Movement: Present. Denies leaking of fluid.   The following portions of the patient's history were reviewed and updated as appropriate: allergies, current medications, past family history, past medical history, past social history, past surgical history and problem list. Problem list updated.  Objective:   Vitals:   07/06/17 1451  BP: 114/65  Pulse: 85  Weight: 117 lb 14.4 oz (53.5 kg)    Fetal Status: Fetal Heart Rate (bpm): 132 Fundal Height: 36 cm Movement: Present  Presentation: Vertex  General:  Alert, oriented and cooperative. Patient is in no acute distress.  Skin: Skin is warm and dry. No rash noted.   Cardiovascular: Normal heart rate noted  Respiratory: Normal respiratory effort, no problems with respiration noted  Abdomen: Soft, gravid, appropriate for gestational age. Pain/Pressure: Present     Pelvic:  Cervical exam performed Dilation: 4 Effacement (%): 30 Station: Ballotable  Extremities: Normal range of motion.  Edema: Trace  Mental Status: Normal mood and affect. Normal behavior. Normal judgment and thought content.   Urinalysis:      Assessment and Plan:  Pregnancy: [id number] at [redacted]w[redacted]d  1. Supervision of high risk pregnancy, antepartum Routine care. To do 1hr GTT today. Depo provera. Iron rx given to patient. Pt confirms on keflex.  - Glucose tolerance, 1  hour  Term labor symptoms and general obstetric precautions including but not limited to vaginal bleeding, contractions, leaking of fluid and fetal movement were reviewed in detail with the patient. Please refer to After Visit Summary for other counseling recommendations.  Return in about 1 week (around 07/13/2017) for 7-10d low risk ob.   [place] Bing, MD

## 2017-07-07 LAB — GLUCOSE TOLERANCE, 1 HOUR: GLUCOSE, 1HR PP: 125 mg/dL (ref 65–199)

## 2017-07-11 ENCOUNTER — Inpatient Hospital Stay (HOSPITAL_COMMUNITY)
Admission: AD | Admit: 2017-07-11 | Discharge: 2017-07-12 | Disposition: A | Discharge status: Home / Self Care | Payer: Medicaid Other | Source: Ambulatory Visit | Attending: Obstetrics and Gynecology | Admitting: Obstetrics and Gynecology

## 2017-07-11 DIAGNOSIS — Z3A37 37 weeks gestation of pregnancy: Secondary | ICD-10-CM | POA: Diagnosis not present

## 2017-07-11 DIAGNOSIS — O368920 Maternal care for other specified fetal problems, second trimester, not applicable or unspecified: Secondary | ICD-10-CM | POA: Insufficient documentation

## 2017-07-11 DIAGNOSIS — O26892 Other specified pregnancy related conditions, second trimester: Secondary | ICD-10-CM | POA: Insufficient documentation

## 2017-07-11 DIAGNOSIS — O09213 Supervision of pregnancy with history of pre-term labor, third trimester: Secondary | ICD-10-CM | POA: Diagnosis present

## 2017-07-11 DIAGNOSIS — O471 False labor at or after 37 completed weeks of gestation: Secondary | ICD-10-CM

## 2017-07-11 DIAGNOSIS — O0990 Supervision of high risk pregnancy, unspecified, unspecified trimester: Secondary | ICD-10-CM

## 2017-07-11 DIAGNOSIS — O09219 Supervision of pregnancy with history of pre-term labor, unspecified trimester: Secondary | ICD-10-CM

## 2017-07-11 DIAGNOSIS — O09899 Supervision of other high risk pregnancies, unspecified trimester: Secondary | ICD-10-CM

## 2017-07-11 DIAGNOSIS — Z3689 Encounter for other specified antenatal screening: Secondary | ICD-10-CM

## 2017-07-11 NOTE — MAU Note (Signed)
Pt here with c/o contractions every 2-5 minutes; Was 4.5 cm in the office last Wednesday. Denies any bleeding; has had some "watery discharge." Report good fetal movement.

## 2017-07-12 ENCOUNTER — Inpatient Hospital Stay (HOSPITAL_COMMUNITY): Payer: Medicaid Other

## 2017-07-12 ENCOUNTER — Encounter (HOSPITAL_COMMUNITY): Payer: Self-pay | Admitting: *Deleted

## 2017-07-12 DIAGNOSIS — O09213 Supervision of pregnancy with history of pre-term labor, third trimester: Secondary | ICD-10-CM | POA: Diagnosis not present

## 2017-07-12 LAB — URINALYSIS, ROUTINE W REFLEX MICROSCOPIC
Bilirubin Urine: NEGATIVE
GLUCOSE, UA: 50 mg/dL — AB
HGB URINE DIPSTICK: NEGATIVE
KETONES UR: NEGATIVE mg/dL
Nitrite: POSITIVE — AB
PROTEIN: NEGATIVE mg/dL
Specific Gravity, Urine: 1.005 (ref 1.005–1.030)
pH: 6 (ref 5.0–8.0)

## 2017-07-12 MED ORDER — LACTATED RINGERS IV BOLUS
1000.0000 mL | Freq: Once | INTRAVENOUS | Status: AC
  Administered 2017-07-12: 1000 mL via INTRAVENOUS

## 2017-07-12 NOTE — MAU Note (Signed)
I have communicated with [name] and reviewed vital signs:  Vitals:   07/11/17 2352 07/12/17 0452  BP: (!) 119/56 108/61  Pulse: 98   Resp: 18   Temp: 98.5 F (36.9 C)   SpO2: 100%     Vaginal exam:  Dilation: 4.5 Effacement (%): 70 Cervical Position: Middle Station: -2 Presentation: Vertex Exam by:: B Jamee Keach RN,   Also reviewed contraction pattern and that BPP 8/8  It has been documented that patient is contracting every 2-5 minutes with no cervical change not indicating active labor.  Patient denies any other complaints.  Based on this report provider has given order for discharge.  A discharge order and diagnosis entered by a provider.   Labor discharge instructions reviewed with patient.

## 2017-07-12 NOTE — Progress Notes (Signed)
Labor Evaluation Provider Note:  Patient presents to MAU with complaints of contractions every 2-5 minutes. Patient was 4.5cm in the office last Wednesday. Notified by RN of of varying NST with variable decelerations. After extensive monitoring NST remained the same- BPP ordered.   [place] Mfm Fetal Bpp Wo Non Stress  Result Date: 07/12/2017 ----------------------------------------------------------------------  OBSTETRICS REPORT                      (Signed Final 07/12/2017 09:08 am) ---------------------------------------------------------------------- Patient Info  ID #:       [id number]                          D.O.B.:  29-Mar-1990 (26 yrs)  Name:       [name]                 Visit Date: 07/12/2017 04:15 am ---------------------------------------------------------------------- Performed By  Performed By:     Ellin Saba        Ref. Address:     [address]                                                             [place], [place]                                                             [address]  Attending:        Charlsie Merles MD         Secondary Phy.:   MAU Nursing-                                                             MAU/Triage  Referred By:      Billey Gosling                Location:         [facility] MD ---------------------------------------------------------------------- Orders   #  Description                                 Code   1  [place] MFM FETAL BPP WO NON STRESS  16109.60  ----------------------------------------------------------------------   #  Ordered By               Order #        Accession #    Episode #   1  Steward Drone          [id number]      [id number]     [id number]  ---------------------------------------------------------------------- Indications   [redacted] weeks gestation of pregnancy                Z3A.37   Variable fetal heart rate  decelerations,       O36.8990   antepartum   Poor obstetric history: Previous preterm       O09.219   delivery, antepartum (x3)  ---------------------------------------------------------------------- OB History  Blood Type:            Height:  5'3"   Weight (lb):  92        BMI:  16.3  Gravidity:    5         Prem:   3         SAB:   1  Living:       3 ---------------------------------------------------------------------- Fetal Evaluation  Num Of Fetuses:     1  Fetal Heart         145  Rate(bpm):  Cardiac Activity:   Observed  Presentation:       Cephalic  Amniotic Fluid  AFI FV:      Subjectively within normal limits  AFI Sum(cm)     %Tile       Largest Pocket(cm)  12.1            41          6.22  RUQ(cm)       RLQ(cm)       LUQ(cm)        LLQ(cm)  6.22          1.36          2.59           1.93 ---------------------------------------------------------------------- Biophysical Evaluation  Amniotic F.V:   Within normal limits       F. Tone:        Observed  F. Movement:    Observed                   Score:          8/8  F. Breathing:   Observed ---------------------------------------------------------------------- Gestational Age  Best:          37w 6d     Det. By:  U/S  (03/22/17)          EDD:   07/27/17 ---------------------------------------------------------------------- Anatomy  Stomach:               Appears normal, left   Bladder:                Appears normal                         sided ---------------------------------------------------------------------- Impression  Intrauterine pregnancy at 37+6 weeks with catagory 2 FHR  tracing  Normal amniotic fluid  BPP 8/8 ---------------------------------------------------------------------- Recommendations  Continue clinical evaluation and management. ----------------------------------------------------------------------                 Charlsie Merles, MD Electronically Signed Final Report   07/12/2017 09:08 am  ----------------------------------------------------------------------  Cervical exam: unchanged after over 2 hours  Dilation: 4.5 Effacement (%): 70 Cervical Position: Middle Station: -2 Presentation: Vertex Exam by:: B Mosca RN  FHR: 135/ moderate variability/ +accels/ no decelerations after BPP Toco: 2-4/ mild by palpation   BPP 8/8. Pt discharged. Follow up as scheduled for prenatal appointments. Return to MAU as needed for labor evaluation.   Sharyon Cable, CNM 07/13/17, 6:57 AM

## 2017-07-13 ENCOUNTER — Ambulatory Visit (INDEPENDENT_AMBULATORY_CARE_PROVIDER_SITE_OTHER): Payer: Medicaid Other | Admitting: Medical

## 2017-07-13 ENCOUNTER — Other Ambulatory Visit: Payer: Self-pay | Admitting: Obstetrics and Gynecology

## 2017-07-13 ENCOUNTER — Encounter: Payer: Self-pay | Admitting: Medical

## 2017-07-13 VITALS — BP 106/62 | HR 84 | Wt 119.2 lb

## 2017-07-13 DIAGNOSIS — O09212 Supervision of pregnancy with history of pre-term labor, second trimester: Secondary | ICD-10-CM | POA: Diagnosis not present

## 2017-07-13 DIAGNOSIS — O09219 Supervision of pregnancy with history of pre-term labor, unspecified trimester: Secondary | ICD-10-CM

## 2017-07-13 DIAGNOSIS — O0932 Supervision of pregnancy with insufficient antenatal care, second trimester: Secondary | ICD-10-CM

## 2017-07-13 DIAGNOSIS — O0992 Supervision of high risk pregnancy, unspecified, second trimester: Secondary | ICD-10-CM

## 2017-07-13 DIAGNOSIS — O09899 Supervision of other high risk pregnancies, unspecified trimester: Secondary | ICD-10-CM

## 2017-07-13 DIAGNOSIS — O0990 Supervision of high risk pregnancy, unspecified, unspecified trimester: Secondary | ICD-10-CM

## 2017-07-13 DIAGNOSIS — O0933 Supervision of pregnancy with insufficient antenatal care, third trimester: Secondary | ICD-10-CM

## 2017-07-13 NOTE — Patient Instructions (Signed)

## 2017-07-13 NOTE — Progress Notes (Signed)
   PRENATAL VISIT NOTE  Subjective:  [name] is a 27 y.o. [id number] at [redacted]w[redacted]d being seen today for ongoing prenatal care.  She is currently monitored for the following issues for this high-risk pregnancy and has UTI in pregnancy, antepartum, third trimester; Supervision of high risk pregnancy, antepartum; History of preterm delivery, currently pregnant, unspecified trimester; Anemia; and Insufficient prenatal care on their problem list.  Patient reports occasional contractions.  Contractions: Irregular. Vag. Bleeding: None.  Movement: Present. Denies leaking of fluid.   The following portions of the patient's history were reviewed and updated as appropriate: allergies, current medications, past family history, past medical history, past social history, past surgical history and problem list. Problem list updated.  Objective:   Vitals:   07/13/17 1543  BP: 106/62  Pulse: 84  Weight: 119 lb 3.2 oz (54.1 kg)    Fetal Status: Fetal Heart Rate (bpm): 143 Fundal Height: 37 cm Movement: Present  Presentation: Vertex  General:  Alert, oriented and cooperative. Patient is in no acute distress.  Skin: Skin is warm and dry. No rash noted.   Cardiovascular: Normal heart rate noted  Respiratory: Normal respiratory effort, no problems with respiration noted  Abdomen: Soft, gravid, appropriate for gestational age.  Pain/Pressure: Present     Pelvic: Cervical exam performed Dilation: 4.5 Effacement (%): 50 Station: -3  Extremities: Normal range of motion.  Edema: Trace  Mental Status: Normal mood and affect. Normal behavior. Normal judgment and thought content.   Assessment and Plan:  Pregnancy: [id number] at [redacted]w[redacted]d  1. Supervision of high risk pregnancy, antepartum - GBS negative, discussed - frequent, but irregular contractions, labor precautions reviewed   2. Insufficient prenatal care in third trimester  3. History of preterm delivery, currently pregnant, unspecified trimester   Term  labor symptoms and general obstetric precautions including but not limited to vaginal bleeding, contractions, leaking of fluid and fetal movement were reviewed in detail with the patient. Please refer to After Visit Summary for other counseling recommendations.  Return in about 1 week (around 07/20/2017) for LOB.  Vonzella Nipple, PA-C

## 2017-07-14 ENCOUNTER — Other Ambulatory Visit: Payer: Self-pay

## 2017-07-14 ENCOUNTER — Inpatient Hospital Stay (HOSPITAL_COMMUNITY)
Admission: AD | Admit: 2017-07-14 | Discharge: 2017-07-14 | Disposition: A | Discharge status: Home / Self Care | Payer: Medicaid Other | Source: Ambulatory Visit | Attending: Obstetrics and Gynecology | Admitting: Obstetrics and Gynecology

## 2017-07-14 ENCOUNTER — Encounter (HOSPITAL_COMMUNITY): Payer: Self-pay

## 2017-07-14 DIAGNOSIS — O479 False labor, unspecified: Secondary | ICD-10-CM

## 2017-07-14 DIAGNOSIS — Z3A38 38 weeks gestation of pregnancy: Secondary | ICD-10-CM | POA: Insufficient documentation

## 2017-07-14 LAB — POCT FERN TEST: POCT Fern Test: NEGATIVE

## 2017-07-14 NOTE — Discharge Instructions (Signed)
Braxton Hicks Contractions °Contractions of the uterus can occur throughout pregnancy, but they are not always a sign that you are in labor. You may have practice contractions called Braxton Hicks contractions. These false labor contractions are sometimes confused with true labor. °What are Braxton Hicks contractions? °Braxton Hicks contractions are tightening movements that occur in the muscles of the uterus before labor. Unlike true labor contractions, these contractions do not result in opening (dilation) and thinning of the cervix. Toward the end of pregnancy (32-34 weeks), Braxton Hicks contractions can happen more often and may become stronger. These contractions are sometimes difficult to tell apart from true labor because they can be very uncomfortable. You should not feel embarrassed if you go to the hospital with false labor. °Sometimes, the only way to tell if you are in true labor is for your health care provider to look for changes in the cervix. The health care provider will do a physical exam and may monitor your contractions. If you are not in true labor, the exam should show that your cervix is not dilating and your water has not broken. °If there are other health problems associated with your pregnancy, it is completely safe for you to be sent home with false labor. You may continue to have Braxton Hicks contractions until you go into true labor. °How to tell the difference between true labor and false labor °True labor °· Contractions last 30-70 seconds. °· Contractions become very regular. °· Discomfort is usually felt in the top of the uterus, and it spreads to the lower abdomen and low back. °· Contractions do not go away with walking. °· Contractions usually become more intense and increase in frequency. °· The cervix dilates and gets thinner. °False labor °· Contractions are usually shorter and not as strong as true labor contractions. °· Contractions are usually irregular. °· Contractions  are often felt in the front of the lower abdomen and in the groin. °· Contractions may go away when you walk around or change positions while lying down. °· Contractions get weaker and are shorter-lasting as time goes on. °· The cervix usually does not dilate or become thin. °Follow these instructions at home: °· Take over-the-counter and prescription medicines only as told by your health care provider. °· Keep up with your usual exercises and follow other instructions from your health care provider. °· Eat and drink lightly if you think you are going into labor. °· If Braxton Hicks contractions are making you uncomfortable: °? Change your position from lying down or resting to walking, or change from walking to resting. °? Sit and rest in a tub of warm water. °? Drink enough fluid to keep your urine pale yellow. Dehydration may cause these contractions. °? Do slow and deep breathing several times an hour. °· Keep all follow-up prenatal visits as told by your health care provider. This is important. °Contact a health care provider if: °· You have a fever. °· You have continuous pain in your abdomen. °Get help right away if: °· Your contractions become stronger, more regular, and closer together. °· You have fluid leaking or gushing from your vagina. °· You pass blood-tinged mucus (bloody show). °· You have bleeding from your vagina. °· You have low back pain that you never had before. °· You feel your baby’s head pushing down and causing pelvic pressure. °· Your baby is not moving inside you as much as it used to. °Summary °· Contractions that occur before labor are called Braxton   Hicks contractions, false labor, or practice contractions. °· Braxton Hicks contractions are usually shorter, weaker, farther apart, and less regular than true labor contractions. True labor contractions usually become progressively stronger and regular and they become more frequent. °· Manage discomfort from Braxton Hicks contractions by  changing position, resting in a warm bath, drinking plenty of water, or practicing deep breathing. °This information is not intended to replace advice given to you by your health care provider. Make sure you discuss any questions you have with your health care provider. °Document Released: 07/08/2016 Document Revised: 07/08/2016 Document Reviewed: 07/08/2016 °Elsevier Interactive Patient Education © 2018 Elsevier Inc. ° °

## 2017-07-14 NOTE — MAU Note (Signed)
Pt reports contractions ever 2-3 mins x 1 hours. Denies LOF or vag bleeding. Reports good fetal movement. Cervix wa s4.5cm on last exam.

## 2017-07-17 ENCOUNTER — Encounter (HOSPITAL_COMMUNITY): Payer: Self-pay | Admitting: *Deleted

## 2017-07-17 ENCOUNTER — Inpatient Hospital Stay (HOSPITAL_COMMUNITY)
Admission: AD | Admit: 2017-07-17 | Discharge: 2017-07-17 | Disposition: A | Discharge status: Home / Self Care | Payer: Medicaid Other | Source: Ambulatory Visit | Attending: Family Medicine | Admitting: Family Medicine

## 2017-07-17 DIAGNOSIS — O471 False labor at or after 37 completed weeks of gestation: Secondary | ICD-10-CM | POA: Diagnosis present

## 2017-07-17 DIAGNOSIS — Z3A38 38 weeks gestation of pregnancy: Secondary | ICD-10-CM | POA: Diagnosis not present

## 2017-07-17 DIAGNOSIS — O479 False labor, unspecified: Secondary | ICD-10-CM

## 2017-07-17 NOTE — Progress Notes (Signed)
Monitor FHT's for reactive tracing,

## 2017-07-17 NOTE — MAU Note (Signed)
I have communicated with Dr. Nira Retort and reviewed vital signs:  Vitals:   07/17/17 0539  BP: 101/64  Pulse: 87  Resp: 16  Temp: 98 F (36.7 C)  SpO2: 100%    Vaginal exam:  Dilation: 5 Effacement (%): 70 Cervical Position: Middle Station: -2 Presentation: Vertex Exam by:: B Hipolito Martinezlopez RN,   Also reviewed contraction pattern and that non-stress test is reactive.  It has been documented that patient is contracting every 7 minutes with no cervical change since last week not indicating active labor.  Patient denies any other complaints.  Based on this report provider has given order for discharge.  A discharge order and diagnosis entered by a provider.   Labor discharge instructions reviewed with patient.

## 2017-07-17 NOTE — MAU Note (Signed)
Pt reports contractions and mucus discharge.

## 2017-07-17 NOTE — Discharge Instructions (Signed)
Braxton Hicks Contractions °Contractions of the uterus can occur throughout pregnancy, but they are not always a sign that you are in labor. You may have practice contractions called Braxton Hicks contractions. These false labor contractions are sometimes confused with true labor. °What are Braxton Hicks contractions? °Braxton Hicks contractions are tightening movements that occur in the muscles of the uterus before labor. Unlike true labor contractions, these contractions do not result in opening (dilation) and thinning of the cervix. Toward the end of pregnancy (32-34 weeks), Braxton Hicks contractions can happen more often and may become stronger. These contractions are sometimes difficult to tell apart from true labor because they can be very uncomfortable. You should not feel embarrassed if you go to the hospital with false labor. °Sometimes, the only way to tell if you are in true labor is for your health care provider to look for changes in the cervix. The health care provider will do a physical exam and may monitor your contractions. If you are not in true labor, the exam should show that your cervix is not dilating and your water has not broken. °If there are other health problems associated with your pregnancy, it is completely safe for you to be sent home with false labor. You may continue to have Braxton Hicks contractions until you go into true labor. °How to tell the difference between true labor and false labor °True labor °· Contractions last 30-70 seconds. °· Contractions become very regular. °· Discomfort is usually felt in the top of the uterus, and it spreads to the lower abdomen and low back. °· Contractions do not go away with walking. °· Contractions usually become more intense and increase in frequency. °· The cervix dilates and gets thinner. °False labor °· Contractions are usually shorter and not as strong as true labor contractions. °· Contractions are usually irregular. °· Contractions  are often felt in the front of the lower abdomen and in the groin. °· Contractions may go away when you walk around or change positions while lying down. °· Contractions get weaker and are shorter-lasting as time goes on. °· The cervix usually does not dilate or become thin. °Follow these instructions at home: °· Take over-the-counter and prescription medicines only as told by your health care provider. °· Keep up with your usual exercises and follow other instructions from your health care provider. °· Eat and drink lightly if you think you are going into labor. °· If Braxton Hicks contractions are making you uncomfortable: °? Change your position from lying down or resting to walking, or change from walking to resting. °? Sit and rest in a tub of warm water. °? Drink enough fluid to keep your urine pale yellow. Dehydration may cause these contractions. °? Do slow and deep breathing several times an hour. °· Keep all follow-up prenatal visits as told by your health care provider. This is important. °Contact a health care provider if: °· You have a fever. °· You have continuous pain in your abdomen. °Get help right away if: °· Your contractions become stronger, more regular, and closer together. °· You have fluid leaking or gushing from your vagina. °· You pass blood-tinged mucus (bloody show). °· You have bleeding from your vagina. °· You have low back pain that you never had before. °· You feel your baby’s head pushing down and causing pelvic pressure. °· Your baby is not moving inside you as much as it used to. °Summary °· Contractions that occur before labor are called Braxton   Hicks contractions, false labor, or practice contractions. °· Braxton Hicks contractions are usually shorter, weaker, farther apart, and less regular than true labor contractions. True labor contractions usually become progressively stronger and regular and they become more frequent. °· Manage discomfort from Braxton Hicks contractions by  changing position, resting in a warm bath, drinking plenty of water, or practicing deep breathing. °This information is not intended to replace advice given to you by your health care provider. Make sure you discuss any questions you have with your health care provider. °Document Released: 07/08/2016 Document Revised: 07/08/2016 Document Reviewed: 07/08/2016 °Elsevier Interactive Patient Education © 2018 Elsevier Inc. ° °

## 2017-07-19 ENCOUNTER — Telehealth: Payer: Self-pay | Admitting: General Practice

## 2017-07-19 ENCOUNTER — Inpatient Hospital Stay (EMERGENCY_DEPARTMENT_HOSPITAL)
Admission: AD | Admit: 2017-07-19 | Discharge: 2017-07-19 | Disposition: A | Discharge status: Home / Self Care | Payer: Medicaid Other | Source: Ambulatory Visit | Attending: Obstetrics and Gynecology | Admitting: Obstetrics and Gynecology

## 2017-07-19 ENCOUNTER — Encounter (HOSPITAL_COMMUNITY): Payer: Self-pay

## 2017-07-19 ENCOUNTER — Encounter: Payer: Medicaid Other | Admitting: Medical

## 2017-07-19 DIAGNOSIS — Z3A38 38 weeks gestation of pregnancy: Secondary | ICD-10-CM

## 2017-07-19 DIAGNOSIS — N898 Other specified noninflammatory disorders of vagina: Secondary | ICD-10-CM | POA: Diagnosis not present

## 2017-07-19 DIAGNOSIS — O479 False labor, unspecified: Secondary | ICD-10-CM

## 2017-07-19 DIAGNOSIS — O26893 Other specified pregnancy related conditions, third trimester: Secondary | ICD-10-CM | POA: Diagnosis not present

## 2017-07-19 DIAGNOSIS — Z3689 Encounter for other specified antenatal screening: Secondary | ICD-10-CM

## 2017-07-19 LAB — POCT FERN TEST: POCT Fern Test: NEGATIVE

## 2017-07-19 NOTE — MAU Note (Signed)
Pt reports leaking clear fluid since 2am that keeps coming out with every contraction. Pt reports cnotractions every 2-3 mins. Denies vaginal bleeding. Reports good fetal movement. Cervix was 5cm on last exam

## 2017-07-19 NOTE — MAU Provider Note (Signed)
History   [id number]   Chief Complaint  Patient presents with  . Rupture of Membranes    HPI [name] is a 27 y.o. female  [phone] .6 wks here with report of leaking clear fluid since 0200.  Leaking of fluid has continued, only with ctx. No gush of fluid. Pt reports contractions q2-3 min. She denies vaginal bleeding. Last intercourse was not recently. She reports good fetal movement. All other systems negative.    No LMP recorded. Patient is pregnant.  OB History  Gravida Para Term Preterm AB Living  5 3 0 SAB TAB Ectopic Multiple Live Births  1 0 0 0 3    # Outcome Date GA Lbr Len/2nd Weight Sex Delivery Anes PTL Lv  5 Current           4 Preterm 02/26/15 [redacted]w[redacted]d 11:50 / 00:12 5 lb 6.6 oz (2.455 kg) F Vag-Spont None  LIV  3 Preterm 09/11/13 [redacted]w[redacted]d 185:01 / 00:04 4 lb 5.8 oz (1.98 kg) M Vag-Spont None  LIV  2 Preterm 11/24/12 [redacted]w[redacted]d 04:55 / 00:45 3 lb 15.9 oz (1.81 kg) F Vag-Spont None  LIV  1 SAB  [redacted]w[redacted]d      N      Birth Comments: PPROM at 19 weeks 3 days; ??incomop cervix-did not labor at home, came in with membranes ruptured.    Past Medical History:  Diagnosis Date  . Anemia    iron   . No pertinent past medical history   . Preterm delivery   . Stillbirth   . UTI (lower urinary tract infection)   . Vaginal wall cyst 01/02/2015   See photo in note from 01/01/15     Family History  Problem Relation Age of Onset  . Hypertension Mother     Social History   Socioeconomic History  . Marital status: Single    Spouse name: Not on file  . Number of children: Not on file  . Years of education: Not on file  . Highest education level: Not on file  Occupational History  . Not on file  Social Needs  . Financial resource strain: Not on file  . Food insecurity:    Worry: Not on file    Inability: Not on file  . Transportation needs:    Medical: Not on file    Non-medical: Not on file  Tobacco Use  . Smoking status: Never Smoker  . Smokeless  tobacco: Never Used  Substance and Sexual Activity  . Alcohol use: No  . Drug use: No  . Sexual activity: Not Currently    Birth control/protection: None    Comment: last sex a few weeks ago  Lifestyle  . Physical activity:    Days per week: Not on file    Minutes per session: Not on file  . Stress: Not on file  Relationships  . Social connections:    Talks on phone: Not on file    Gets together: Not on file    Attends religious service: Not on file    Active member of club or organization: Not on file    Attends meetings of clubs or organizations: Not on file    Relationship status: Not on file  Other Topics Concern  . Not on file  Social History Narrative  . Not on file    Allergies  Allergen Reactions  . Cherry Hives and Itching    No current facility-administered medications on file prior to encounter.  Current Outpatient Medications on File Prior to Encounter  Medication Sig Dispense Refill  . ferrous gluconate (FERGON) 324 MG tablet Take 1 tablet (324 mg total) by mouth 2 (two) times daily with a meal. 60 tablet 1  . prenatal vitamin w/FE, FA (PRENATAL 1 + 1) 27-1 MG TABS tablet Take 1 tablet by mouth daily at 12 noon. 30 each 6  . Multiple Vitamin (MULTIVITAMIN) tablet Take 1 tablet by mouth daily.    Marland Kitchen NIFEdipine (PROCARDIA) 20 MG capsule Take 20 mg by mouth 3 (three) times daily.     Review of Systems  Gastrointestinal: Positive for abdominal pain.  Genitourinary: Positive for vaginal discharge. Negative for vaginal bleeding.   Physical Exam   Vitals:   07/19/17 0405 07/19/17 0410  BP:  113/62  Pulse:  86  Resp:  16  Temp:  97.6 F (36.4 C)  SpO2:  98%  Weight: 121 lb (54.9 kg)   Height:  (1.6 m)     Physical Exam  Constitutional: She is oriented to person, place, and time. She appears well-developed and well-nourished. No distress.  HENT:  Head: Normocephalic and atraumatic.  Neck: Normal range of motion.  Respiratory: Effort normal. No  respiratory distress.  GI: Soft. She exhibits no distension. There is no tenderness.  gravid  Genitourinary:  Genitourinary Comments: SSE: +thin yellow discharge, no pool, fern neg SVE: 5/70/-2, vtx  Musculoskeletal: Normal range of motion.  Neurological: She is alert and oriented to person, place, and time.  Skin: Skin is warm and dry.  Psychiatric: She has a normal mood and affect.  EFM: 125 bpm, mod variability, + accels, no decels Toco: 1-5  MAU Course  Procedures  MDM Labs ordered and reviewed. No evidence of labor or SROM. No cervical change in recheck. Stable for discharge home.  Assessment and Plan   1. [redacted] weeks gestation of pregnancy   2. NST (non-stress test) reactive   3. Vaginal discharge during pregnancy in third trimester   4. False labor    Discharge home Follow up in OB office as scheduled Labor precautions  Allergies as of 07/19/2017      Reactions   Cherry Hives, Itching      Medication List    STOP taking these medications   multivitamin tablet   NIFEdipine 20 MG capsule Commonly known as:  PROCARDIA     TAKE these medications   ferrous gluconate 324 MG tablet Commonly known as:  FERGON Take 1 tablet (324 mg total) by mouth 2 (two) times daily with a meal.   prenatal vitamin w/FE, FA 27-1 MG Tabs tablet Take 1 tablet by mouth daily at 12 noon.      Donette Larry, CNM 07/19/2017 4:30 AM

## 2017-07-19 NOTE — Discharge Instructions (Signed)
Braxton Hicks Contractions °Contractions of the uterus can occur throughout pregnancy, but they are not always a sign that you are in labor. You may have practice contractions called Braxton Hicks contractions. These false labor contractions are sometimes confused with true labor. °What are Braxton Hicks contractions? °Braxton Hicks contractions are tightening movements that occur in the muscles of the uterus before labor. Unlike true labor contractions, these contractions do not result in opening (dilation) and thinning of the cervix. Toward the end of pregnancy (32-34 weeks), Braxton Hicks contractions can happen more often and may become stronger. These contractions are sometimes difficult to tell apart from true labor because they can be very uncomfortable. You should not feel embarrassed if you go to the hospital with false labor. °Sometimes, the only way to tell if you are in true labor is for your health care provider to look for changes in the cervix. The health care provider will do a physical exam and may monitor your contractions. If you are not in true labor, the exam should show that your cervix is not dilating and your water has not broken. °If there are other health problems associated with your pregnancy, it is completely safe for you to be sent home with false labor. You may continue to have Braxton Hicks contractions until you go into true labor. °How to tell the difference between true labor and false labor °True labor °· Contractions last 30-70 seconds. °· Contractions become very regular. °· Discomfort is usually felt in the top of the uterus, and it spreads to the lower abdomen and low back. °· Contractions do not go away with walking. °· Contractions usually become more intense and increase in frequency. °· The cervix dilates and gets thinner. °False labor °· Contractions are usually shorter and not as strong as true labor contractions. °· Contractions are usually irregular. °· Contractions  are often felt in the front of the lower abdomen and in the groin. °· Contractions may go away when you walk around or change positions while lying down. °· Contractions get weaker and are shorter-lasting as time goes on. °· The cervix usually does not dilate or become thin. °Follow these instructions at home: °· Take over-the-counter and prescription medicines only as told by your health care provider. °· Keep up with your usual exercises and follow other instructions from your health care provider. °· Eat and drink lightly if you think you are going into labor. °· If Braxton Hicks contractions are making you uncomfortable: °? Change your position from lying down or resting to walking, or change from walking to resting. °? Sit and rest in a tub of warm water. °? Drink enough fluid to keep your urine pale yellow. Dehydration may cause these contractions. °? Do slow and deep breathing several times an hour. °· Keep all follow-up prenatal visits as told by your health care provider. This is important. °Contact a health care provider if: °· You have a fever. °· You have continuous pain in your abdomen. °Get help right away if: °· Your contractions become stronger, more regular, and closer together. °· You have fluid leaking or gushing from your vagina. °· You pass blood-tinged mucus (bloody show). °· You have bleeding from your vagina. °· You have low back pain that you never had before. °· You feel your baby’s head pushing down and causing pelvic pressure. °· Your baby is not moving inside you as much as it used to. °Summary °· Contractions that occur before labor are called Braxton   Hicks contractions, false labor, or practice contractions. °· Braxton Hicks contractions are usually shorter, weaker, farther apart, and less regular than true labor contractions. True labor contractions usually become progressively stronger and regular and they become more frequent. °· Manage discomfort from Braxton Hicks contractions by  changing position, resting in a warm bath, drinking plenty of water, or practicing deep breathing. °This information is not intended to replace advice given to you by your health care provider. Make sure you discuss any questions you have with your health care provider. °Document Released: 07/08/2016 Document Revised: 07/08/2016 Document Reviewed: 07/08/2016 °Elsevier Interactive Patient Education © 2018 Elsevier Inc. ° °

## 2017-07-19 NOTE — Telephone Encounter (Signed)
Patient missed her appointment this morning.  Tried calling patient to reschedule, but no answer.  Left message on VM for patient to give our office a call back to reschedule.

## 2017-07-19 NOTE — MAU Note (Signed)
Pt not in lobby.  

## 2017-07-20 ENCOUNTER — Other Ambulatory Visit: Payer: Self-pay

## 2017-07-20 ENCOUNTER — Inpatient Hospital Stay (HOSPITAL_COMMUNITY)
Admission: AD | Admit: 2017-07-20 | Discharge: 2017-07-23 | DRG: 807 | Disposition: A | Discharge status: Home / Self Care | Payer: Medicaid Other | Source: Ambulatory Visit | Attending: Obstetrics and Gynecology | Admitting: Obstetrics and Gynecology

## 2017-07-20 ENCOUNTER — Encounter (HOSPITAL_COMMUNITY): Payer: Self-pay

## 2017-07-20 DIAGNOSIS — O9902 Anemia complicating childbirth: Secondary | ICD-10-CM | POA: Diagnosis present

## 2017-07-20 DIAGNOSIS — O09899 Supervision of other high risk pregnancies, unspecified trimester: Secondary | ICD-10-CM

## 2017-07-20 DIAGNOSIS — Z3A39 39 weeks gestation of pregnancy: Secondary | ICD-10-CM

## 2017-07-20 DIAGNOSIS — O43123 Velamentous insertion of umbilical cord, third trimester: Secondary | ICD-10-CM | POA: Diagnosis present

## 2017-07-20 DIAGNOSIS — O09219 Supervision of pregnancy with history of pre-term labor, unspecified trimester: Secondary | ICD-10-CM

## 2017-07-20 DIAGNOSIS — Z3483 Encounter for supervision of other normal pregnancy, third trimester: Secondary | ICD-10-CM | POA: Diagnosis present

## 2017-07-20 DIAGNOSIS — O0930 Supervision of pregnancy with insufficient antenatal care, unspecified trimester: Secondary | ICD-10-CM

## 2017-07-20 DIAGNOSIS — D649 Anemia, unspecified: Secondary | ICD-10-CM | POA: Diagnosis present

## 2017-07-20 DIAGNOSIS — O2343 Unspecified infection of urinary tract in pregnancy, third trimester: Secondary | ICD-10-CM | POA: Diagnosis present

## 2017-07-20 DIAGNOSIS — O0990 Supervision of high risk pregnancy, unspecified, unspecified trimester: Secondary | ICD-10-CM

## 2017-07-20 LAB — CBC
HEMATOCRIT: 30.5 % — AB (ref 36.0–46.0)
HEMOGLOBIN: 9.4 g/dL — AB (ref 12.0–15.0)
MCH: 24.2 pg — AB (ref 26.0–34.0)
MCHC: 30.8 g/dL (ref 30.0–36.0)
MCV: 78.4 fL (ref 78.0–100.0)
Platelets: 316 10*3/uL (ref 150–400)
RBC: 3.89 MIL/uL (ref 3.87–5.11)
RDW: 16.8 % — AB (ref 11.5–15.5)
WBC: 9.4 10*3/uL (ref 4.0–10.5)

## 2017-07-20 LAB — POCT FERN TEST: POCT FERN TEST: POSITIVE

## 2017-07-20 MED ORDER — OXYTOCIN 40 UNITS IN LACTATED RINGERS INFUSION - SIMPLE MED
2.5000 [IU]/h | INTRAVENOUS | Status: DC
  Filled 2017-07-20: qty 1000

## 2017-07-20 MED ORDER — FENTANYL CITRATE (PF) 100 MCG/2ML IJ SOLN
100.0000 ug | INTRAMUSCULAR | Status: DC | PRN
  Filled 2017-07-20: qty 2

## 2017-07-20 MED ORDER — OXYTOCIN BOLUS FROM INFUSION: 500.0000 mL | Freq: Once | INTRAVENOUS | Status: DC

## 2017-07-20 MED ORDER — OXYCODONE-ACETAMINOPHEN 5-325 MG PO TABS: 2.0000 | ORAL_TABLET | ORAL | Status: DC | PRN

## 2017-07-20 MED ORDER — LACTATED RINGERS IV SOLN
500.0000 mL | INTRAVENOUS | Status: DC | PRN
  Administered 2017-07-20: 500 mL via INTRAVENOUS

## 2017-07-20 MED ORDER — LACTATED RINGERS IV SOLN
INTRAVENOUS | Status: DC
  Administered 2017-07-20: 23:00:00 via INTRAVENOUS

## 2017-07-20 MED ORDER — LIDOCAINE HCL (PF) 1 % IJ SOLN
30.0000 mL | INTRAMUSCULAR | Status: DC | PRN
  Filled 2017-07-20: qty 30

## 2017-07-20 MED ORDER — FENTANYL 2.5 MCG/ML BUPIVACAINE 1/10 % EPIDURAL INFUSION (WH - ANES)
INTRAMUSCULAR | Status: AC
  Filled 2017-07-20: qty 100

## 2017-07-20 MED ORDER — ONDANSETRON HCL 4 MG/2ML IJ SOLN: 4.0000 mg | Freq: Four times a day (QID) | INTRAMUSCULAR | Status: DC | PRN

## 2017-07-20 MED ORDER — OXYCODONE-ACETAMINOPHEN 5-325 MG PO TABS: 1.0000 | ORAL_TABLET | ORAL | Status: DC | PRN

## 2017-07-20 MED ORDER — ACETAMINOPHEN 325 MG PO TABS
650.0000 mg | ORAL_TABLET | ORAL | Status: DC | PRN
  Filled 2017-07-20: qty 2

## 2017-07-20 MED ORDER — SOD CITRATE-CITRIC ACID 500-334 MG/5ML PO SOLN: 30.0000 mL | ORAL | Status: DC | PRN

## 2017-07-20 NOTE — H&P (Addendum)
[name] is a 27 y.o. female [phone] @ 39.0wks by 21wk U/S presenting for SROM . Her preg has been followed by the [facility] service and has been remarkable for 1) closely spaced preg 2) PTD x 3 3) anemia 4) GBS neg  OB History    Gravida  5   Para  3   Term  0   Preterm  3   AB  1   Living  3     SAB  1   TAB  0   Ectopic  0   Multiple  0   Live Births  3          Past Medical History:  Diagnosis Date  . Anemia    iron   . No pertinent past medical history   . Preterm delivery   . Stillbirth   . UTI (lower urinary tract infection)   . Vaginal wall cyst 01/02/2015   See photo in note from 01/01/15    Past Surgical History:  Procedure Laterality Date  . NO PAST SURGERIES     Family History: family history includes Hypertension in her mother. Social History:  reports that she has never smoked. She has never used smokeless tobacco. She reports that she does not drink alcohol or use drugs.     Maternal Diabetes: No Genetic Screening: Normal Maternal Ultrasounds/Referrals: Normal Fetal Ultrasounds or other Referrals:  None Maternal Substance Abuse:  No Significant Maternal Medications:  None Significant Maternal Lab Results:  Lab values include: Group B Strep negative Other Comments:  None  Review of Systems  Constitutional: Negative.   Genitourinary:       One episode of bloody discharge   Maternal Medical History:  Reason for admission: Rupture of membranes and contractions.   Contractions: Onset was 1-2 hours ago.   Frequency: irregular.   Perceived severity is moderate.    Fetal activity: Perceived fetal activity is normal.   Last perceived fetal movement was within the past hour.    Prenatal complications: no prenatal complications Prenatal Complications - Diabetes: none.    Dilation: 5 Effacement (%): 70 Station: -1 Exam by:: Philipp Deputy, CNM Blood pressure 110/68, pulse 95, temperature 98.6 F (37 C), temperature source Oral,  resp. rate 16, height  (1.6 m), weight 54.9 kg (121 lb), SpO2 98 %, currently breastfeeding. Exam Physical Exam  Constitutional: She is oriented to person, place, and time. She appears well-developed and well-nourished. No distress.  Eyes: EOM are normal.  Respiratory: Effort normal.  GI:  EFM 140s, +accels, no decels, Cat 1 Ctx q 3-4 mins  Genitourinary:  Genitourinary Comments: SSE: +pool, +fern  Neurological: She is alert and oriented to person, place, and time.  Skin: She is not diaphoretic.  Psychiatric: She has a normal mood and affect. Her behavior is normal.    Prenatal labs: ABO, Rh: --/--/AB POS (04/17 0547) Antibody: NEG (04/17 0547) Rubella: 1.64 (01/28 1146) RPR: Non Reactive (04/17 0547)  HBsAg: Negative (01/28 1146)  HIV: Non Reactive (01/28 1146)  GBS:   Negative  Assessment/Plan: 27 y.o. [id number] at [redacted]w[redacted]d being admitted for SROM @ 2144 confirmed by SSE + ferning at 2300  1-Labor - expectant management 2-Pain control - IV pain meds 3-ID: GBS negative 4-Contraception - Depo shot 5-Baby plan - girl, breast/bottle    Mackenzie Bonier, MD, PGY-1 Family Medicine - [facility] 07/20/2017, 11:11 PM  CNM attestation:  I have seen and examined this patient; I agree with above documentation in  the resident's note.   [name] is a 27 y.o. [id number] here for SROM/labor  PE: BP (!) 96/55 (BP Location: Left Arm)   Pulse 77   Temp 98.4 F (36.9 C) (Oral)   Resp 20   Ht  (1.6 m)   Wt 55.3 kg (122 lb)   SpO2 98%   BMI 21.61 kg/m   Resp: normal effort, no distress Abd: gravid  ROS, labs, PMH reviewed  Plan: Admit to [facility] Admission Hgb 9.4- stable Expectant management Anticipate SVD  Cam Hai CNM 07/21/2017, 12:33 AM

## 2017-07-20 NOTE — MAU Note (Signed)
Pt reports contractions every 2-3 mins x2 hours. Had some vaginal bleeding earlier today, but none now. Denies LOF. Reports good fetal movement. Cervix was 5cm on Monday.

## 2017-07-21 ENCOUNTER — Encounter (HOSPITAL_COMMUNITY): Payer: Self-pay

## 2017-07-21 DIAGNOSIS — Z3A39 39 weeks gestation of pregnancy: Secondary | ICD-10-CM

## 2017-07-21 LAB — TYPE AND SCREEN
ABO/RH(D): AB POS
ANTIBODY SCREEN: NEGATIVE

## 2017-07-21 LAB — RPR: RPR Ser Ql: NONREACTIVE

## 2017-07-21 MED ORDER — COCONUT OIL OIL: 1.0000 "application " | TOPICAL_OIL | Status: DC | PRN

## 2017-07-21 MED ORDER — LACTATED RINGERS IV SOLN: INTRAVENOUS | Status: DC

## 2017-07-21 MED ORDER — PRENATAL MULTIVITAMIN CH
1.0000 | ORAL_TABLET | Freq: Every day | ORAL | Status: DC
  Administered 2017-07-21: 1 via ORAL
  Filled 2017-07-21: qty 1

## 2017-07-21 MED ORDER — ONDANSETRON HCL 4 MG PO TABS: 4.0000 mg | ORAL_TABLET | ORAL | Status: DC | PRN

## 2017-07-21 MED ORDER — PRENATAL PLUS 27-1 MG PO TABS
1.0000 | ORAL_TABLET | Freq: Every day | ORAL | Status: DC
Start: 2017-07-21 — End: 2017-07-23
  Administered 2017-07-22 – 2017-07-23 (×2): 1 via ORAL
  Filled 2017-07-21 (×3): qty 1

## 2017-07-21 MED ORDER — BENZOCAINE-MENTHOL 20-0.5 % EX AERO
1.0000 "application " | INHALATION_SPRAY | CUTANEOUS | Status: DC | PRN
  Administered 2017-07-21: 1 via TOPICAL
  Filled 2017-07-21: qty 56

## 2017-07-21 MED ORDER — OXYCODONE-ACETAMINOPHEN 5-325 MG PO TABS: 1.0000 | ORAL_TABLET | ORAL | Status: DC | PRN

## 2017-07-21 MED ORDER — OXYCODONE-ACETAMINOPHEN 5-325 MG PO TABS: 2.0000 | ORAL_TABLET | ORAL | Status: DC | PRN

## 2017-07-21 MED ORDER — SOD CITRATE-CITRIC ACID 500-334 MG/5ML PO SOLN: 30.0000 mL | ORAL | Status: DC | PRN

## 2017-07-21 MED ORDER — WITCH HAZEL-GLYCERIN EX PADS: 1.0000 "application " | MEDICATED_PAD | CUTANEOUS | Status: DC | PRN

## 2017-07-21 MED ORDER — SIMETHICONE 80 MG PO CHEW: 80.0000 mg | CHEWABLE_TABLET | ORAL | Status: DC | PRN

## 2017-07-21 MED ORDER — FERROUS GLUCONATE 324 (38 FE) MG PO TABS
324.0000 mg | ORAL_TABLET | Freq: Two times a day (BID) | ORAL | Status: DC
  Administered 2017-07-21 – 2017-07-23 (×4): 324 mg via ORAL
  Filled 2017-07-21 (×7): qty 1

## 2017-07-21 MED ORDER — LACTATED RINGERS IV SOLN: 500.0000 mL | INTRAVENOUS | Status: DC | PRN

## 2017-07-21 MED ORDER — ZOLPIDEM TARTRATE 5 MG PO TABS: 5.0000 mg | ORAL_TABLET | Freq: Every evening | ORAL | Status: DC | PRN

## 2017-07-21 MED ORDER — DIBUCAINE 1 % RE OINT: 1.0000 "application " | TOPICAL_OINTMENT | RECTAL | Status: DC | PRN

## 2017-07-21 MED ORDER — ONDANSETRON HCL 4 MG/2ML IJ SOLN: 4.0000 mg | Freq: Four times a day (QID) | INTRAMUSCULAR | Status: DC | PRN

## 2017-07-21 MED ORDER — DIPHENHYDRAMINE HCL 25 MG PO CAPS: 25.0000 mg | ORAL_CAPSULE | Freq: Four times a day (QID) | ORAL | Status: DC | PRN

## 2017-07-21 MED ORDER — LIDOCAINE HCL (PF) 1 % IJ SOLN
30.0000 mL | INTRAMUSCULAR | Status: DC | PRN
  Filled 2017-07-21: qty 30

## 2017-07-21 MED ORDER — IBUPROFEN 600 MG PO TABS
600.0000 mg | ORAL_TABLET | Freq: Four times a day (QID) | ORAL | Status: DC
  Administered 2017-07-21 – 2017-07-23 (×11): 600 mg via ORAL
  Filled 2017-07-21 (×12): qty 1

## 2017-07-21 MED ORDER — OXYTOCIN 10 UNIT/ML IJ SOLN
10.0000 [IU] | Freq: Once | INTRAMUSCULAR | Status: AC
  Administered 2017-07-21: 10 [IU] via INTRAMUSCULAR

## 2017-07-21 MED ORDER — OXYTOCIN 40 UNITS IN LACTATED RINGERS INFUSION - SIMPLE MED: 2.5000 [IU]/h | INTRAVENOUS | Status: DC

## 2017-07-21 MED ORDER — OXYTOCIN BOLUS FROM INFUSION: 500.0000 mL | Freq: Once | INTRAVENOUS | Status: DC

## 2017-07-21 MED ORDER — SENNOSIDES-DOCUSATE SODIUM 8.6-50 MG PO TABS
2.0000 | ORAL_TABLET | ORAL | Status: DC
  Administered 2017-07-21 – 2017-07-22 (×2): 2 via ORAL
  Filled 2017-07-21 (×2): qty 2

## 2017-07-21 MED ORDER — ONDANSETRON HCL 4 MG/2ML IJ SOLN: 4.0000 mg | INTRAMUSCULAR | Status: DC | PRN

## 2017-07-21 MED ORDER — TETANUS-DIPHTH-ACELL PERTUSSIS 5-2.5-18.5 LF-MCG/0.5 IM SUSP: 0.5000 mL | Freq: Once | INTRAMUSCULAR | Status: DC

## 2017-07-21 MED ORDER — ACETAMINOPHEN 325 MG PO TABS
650.0000 mg | ORAL_TABLET | ORAL | Status: DC | PRN
  Administered 2017-07-21 – 2017-07-23 (×7): 650 mg via ORAL
  Filled 2017-07-21 (×3): qty 2

## 2017-07-21 MED ORDER — ACETAMINOPHEN 325 MG PO TABS
650.0000 mg | ORAL_TABLET | ORAL | Status: DC | PRN
  Administered 2017-07-21 – 2017-07-22 (×2): 650 mg via ORAL
  Filled 2017-07-21 (×4): qty 2

## 2017-07-21 MED ORDER — FENTANYL CITRATE (PF) 100 MCG/2ML IJ SOLN: 100.0000 ug | INTRAMUSCULAR | Status: DC | PRN

## 2017-07-22 MED ORDER — MEDROXYPROGESTERONE ACETATE 150 MG/ML IM SUSP
150.0000 mg | Freq: Once | INTRAMUSCULAR | Status: AC
  Administered 2017-07-23: 150 mg via INTRAMUSCULAR
  Filled 2017-07-22: qty 1

## 2017-07-22 NOTE — Progress Notes (Addendum)
Post Partum Day 1 Subjective: no complaints, up ad lib, voiding, tolerating PO and + flatus  Objective: Blood pressure (!) 95/58, pulse 71, temperature 98.1 F (36.7 C), temperature source Oral, resp. rate 18, height '5\' 3"'  (1.6 m), weight 55.3 kg (122 lb), SpO2 100 %, unknown if currently breastfeeding.  Physical Exam:  General: alert, cooperative and no distress Lochia: appropriate Uterine Fundus: firm DVT Evaluation: No evidence of DVT seen on physical exam. Negative Homan's sign. No cords or calf tenderness. No significant calf/ankle edema.  Recent Labs    07/20/17 2315  HGB 9.4*  HCT 30.5*    Assessment/Plan: Plan for discharge tomorrow   LOS: 2 days   [name] 07/22/2017, 9:26 AM   I have spoken with and examined this patient and agree with resident/PA-S/Med-S/SNM's note and plan of care. VSS, HRR&R, Resp unlabored, Legs neg.  Nigel Berthold, CNM 07/26/2017 9:13 AM

## 2017-07-22 NOTE — Progress Notes (Signed)
CSW received consult for Late and limited PNC.  CSW reviewed chart and is screening out consult as it does not meet criteria for automatic CSW consult and infant drug screening.  MOB started care prior to 28 weeks and had more than 3 visits.  She also had admissions to the hospital for care.  Please consult CSW if current concerns arise or by MOB's request. 

## 2017-07-23 MED ORDER — IBUPROFEN 600 MG PO TABS: 600.0000 mg | ORAL_TABLET | Freq: Four times a day (QID) | ORAL | 0 refills | Status: DC | PRN

## 2017-07-23 NOTE — Discharge Instructions (Signed)
Vaginal Delivery, Care After °Refer to this sheet in the next few weeks. These instructions provide you with information about caring for yourself after vaginal delivery. Your health care provider may also give you more specific instructions. Your treatment has been planned according to current medical practices, but problems sometimes occur. Call your health care provider if you have any problems or questions. °What can I expect after the procedure? °After vaginal delivery, it is common to have: °· Some bleeding from your vagina. °· Soreness in your abdomen, your vagina, and the area of skin between your vaginal opening and your anus (perineum). °· Pelvic cramps. °· Fatigue. ° °Follow these instructions at home: °Medicines °· Take over-the-counter and prescription medicines only as told by your health care provider. °· If you were prescribed an antibiotic medicine, take it as told by your health care provider. Do not stop taking the antibiotic until it is finished. °Driving ° °· Do not drive or operate heavy machinery while taking prescription pain medicine. °· Do not drive for 24 hours if you received a sedative. °Lifestyle °· Do not drink alcohol. This is especially important if you are breastfeeding or taking medicine to relieve pain. °· Do not use tobacco products, including cigarettes, chewing tobacco, or e-cigarettes. If you need help quitting, ask your health care provider. °Eating and drinking °· Drink at least 8 eight-ounce glasses of water every day unless you are told not to by your health care provider. If you choose to breastfeed your baby, you may need to drink more water than this. °· Eat high-fiber foods every day. These foods may help prevent or relieve constipation. High-fiber foods include: °? Whole grain cereals and breads. °? Brown rice. °? Beans. °? Fresh fruits and vegetables. °Activity °· Return to your normal activities as told by your health care provider. Ask your health care provider  what activities are safe for you. °· Rest as much as possible. Try to rest or take a nap when your baby is sleeping. °· Do not lift anything that is heavier than your baby or 10 lb (4.5 kg) until your health care provider says that it is safe. °· Talk with your health care provider about when you can engage in sexual activity. This may depend on your: °? Risk of infection. °? Rate of healing. °? Comfort and desire to engage in sexual activity. °Vaginal Care °· If you have an episiotomy or a vaginal tear, check the area every day for signs of infection. Check for: °? More redness, swelling, or pain. °? More fluid or blood. °? Warmth. °? Pus or a bad smell. °· Do not use tampons or douches until your health care provider says this is safe. °· Watch for any blood clots that may pass from your vagina. These may look like clumps of dark red, brown, or black discharge. °General instructions °· Keep your perineum clean and dry as told by your health care provider. °· Wear loose, comfortable clothing. °· Wipe from front to back when you use the toilet. °· Ask your health care provider if you can shower or take a bath. If you had an episiotomy or a perineal tear during labor and delivery, your health care provider may tell you not to take baths for a certain length of time. °· Wear a bra that supports your breasts and fits you well. °· If possible, have someone help you with household activities and help care for your baby for at least a few days after   you leave the hospital. °· Keep all follow-up visits for you and your baby as told by your health care provider. This is important. °Contact a health care provider if: °· You have: °? Vaginal discharge that has a bad smell. °? Difficulty urinating. °? Pain when urinating. °? A sudden increase or decrease in the frequency of your bowel movements. °? More redness, swelling, or pain around your episiotomy or vaginal tear. °? More fluid or blood coming from your episiotomy or  vaginal tear. °? Pus or a bad smell coming from your episiotomy or vaginal tear. °? A fever. °? A rash. °? Little or no interest in activities you used to enjoy. °? Questions about caring for yourself or your baby. °· Your episiotomy or vaginal tear feels warm to the touch. °· Your episiotomy or vaginal tear is separating or does not appear to be healing. °· Your breasts are painful, hard, or turn red. °· You feel unusually sad or worried. °· You feel nauseous or you vomit. °· You pass large blood clots from your vagina. If you pass a blood clot from your vagina, save it to show to your health care provider. Do not flush blood clots down the toilet without having your health care provider look at them. °· You urinate more than usual. °· You are dizzy or light-headed. °· You have not breastfed at all and you have not had a menstrual period for 12 weeks after delivery. °· You have stopped breastfeeding and you have not had a menstrual period for 12 weeks after you stopped breastfeeding. °Get help right away if: °· You have: °? Pain that does not go away or does not get better with medicine. °? Chest pain. °? Difficulty breathing. °? Blurred vision or spots in your vision. °? Thoughts about hurting yourself or your baby. °· You develop pain in your abdomen or in one of your legs. °· You develop a severe headache. °· You faint. °· You bleed from your vagina so much that you fill two sanitary pads in one hour. °This information is not intended to replace advice given to you by your health care provider. Make sure you discuss any questions you have with your health care provider. °Document Released: 02/20/2000 Document Revised: 08/06/2015 Document Reviewed: 03/09/2015 °Elsevier Interactive Patient Education © 2018 Elsevier Inc. ° °

## 2017-07-23 NOTE — Discharge Summary (Addendum)
OB Discharge Summary     Patient Name: [name] DOB: 1990-07-06 MRN: [id number]  Date of admission: 07/20/2017 Delivering MD: Cam Hai D   Date of discharge: 07/23/2017  Admitting diagnosis: 39 wks ctx Intrauterine pregnancy: [redacted]w[redacted]d     Secondary diagnosis:  Principal Problem:   Supervision of high risk pregnancy, antepartum Active Problems:   UTI in pregnancy, antepartum, third trimester   History of preterm delivery, currently pregnant, unspecified trimester   Anemia   Insufficient prenatal care   Normal labor   SVD (spontaneous vaginal delivery)  Additional problems: none     Discharge diagnosis: Term Pregnancy Delivered                                                                                                Post partum procedures:none  Augmentation: none  Complications: None  Hospital course:  Onset of Labor With Vaginal Delivery     27 y.o. yo [id number] at [redacted]w[redacted]d was admitted in Active Labor on 07/20/2017. Patient had an uncomplicated labor course as follows:  Membrane Rupture Time/Date: 12:05 AM ,07/21/2017   Intrapartum Procedures: Episiotomy: None [1]                                         Lacerations:  None [1]  Patient had a precipitous delivery of a Viable infant. 07/21/2017  Information for the patient's newborn:  Adiyah, Lame [[id number]]  Delivery Method: Vag-Spont    Pateint had an uncomplicated postpartum course. She was seen by SW inpt and there are no barriers to discharge. She is ambulating, tolerating a regular diet, passing flatus, and urinating well. Patient is discharged home in stable condition on 07/23/17.   Physical exam  Vitals:   07/21/17 1852 07/22/17 0555 07/22/17 1757 07/23/17 0623  BP: (!) 92/53 (!) 95/58 104/76 102/67  Pulse: 66 71 95 82  Resp: Temp: 98.3 F (36.8 C) 98.1 F (36.7 C) 99.1 F (37.3 C) 97.8 F (36.6 C)  TempSrc: Oral Oral Oral Oral  SpO2:    100%  Weight:      Height:        General: alert, cooperative and no distress Lochia: appropriate Uterine Fundus: firm Incision: N/A DVT Evaluation: No evidence of DVT seen on physical exam. Labs: Lab Results  Component Value Date   WBC 9.4 07/20/2017   HGB 9.4 (L) 07/20/2017   HCT 30.5 (L) 07/20/2017   MCV 78.4 07/20/2017   PLT 316 07/20/2017   CMP Latest Ref Rng & Units 08/24/2016  Glucose 65 - 99 mg/dL 696(E)  BUN 6 - 20 mg/dL 10  Creatinine 9.52 - 8.41 mg/dL 3.24  Sodium 401 - 027 mmol/L 137  Potassium 3.5 - 5.1 mmol/L 3.6  Chloride 101 - 111 mmol/L 106  CO2 22 - 32 mmol/L 23  Calcium 8.9 - 10.3 mg/dL 2.5(D)  Total Protein 6.5 - 8.1 g/dL 7.4  Total Bilirubin 0.3 - 1.2 mg/dL 6.6(Y)  Alkaline Phos 38 -  126 U/L 49  AST 15 - 41 U/L 19  ALT 14 - 54 U/L 13(L)    Discharge instruction: per After Visit Summary and "Baby and Me Booklet".  After visit meds:  Allergies as of 07/23/2017      Reactions   Cherry Hives, Itching      Medication List    TAKE these medications   ferrous gluconate 324 MG tablet Commonly known as:  FERGON Take 1 tablet (324 mg total) by mouth 2 (two) times daily with a meal.   ibuprofen 600 MG tablet Commonly known as:  ADVIL,MOTRIN Take 1 tablet (600 mg total) by mouth every 6 (six) hours as needed.   prenatal vitamin w/FE, FA 27-1 MG Tabs tablet Take 1 tablet by mouth daily at 12 noon.       Diet: routine diet  Activity: Advance as tolerated. Pelvic rest for 6 weeks.   Outpatient follow up:6 weeks Follow up Appt: Future Appointments  Date Time Provider Department Center  09/02/2017  2:15 PM Burleson, Brand Males, NP [facility]   Follow up Visit:No follow-ups on file.  Postpartum contraception: Depo Provera  Newborn Data: Live born female  Birth Weight: 6 lb 12.8 oz (3084 g) APGAR: 8, 8  Newborn Delivery   Birth date/time:  07/21/2017 00:07:00 Delivery type:  Vaginal, Spontaneous     Baby Feeding: Bottle and Breast Disposition:home with  mother   07/23/2017 Felisa Bonier, MD, PGY-1 Family Medicine- [facility] [place]  CNM attestation I have seen and examined this patient and agree with above documentation in the resident's note.   [name] is a 27 y.o. [id number] s/p SVD.   Pain is well controlled.  Plan for birth control is Depo-Provera.  Method of Feeding: both  PE:  BP 102/67 (BP Location: Left Arm)   Pulse 82   Temp 97.8 F (36.6 C) (Oral)   Resp 18   Ht  (1.6 m)   Wt 55.3 kg (122 lb)   SpO2 100%   Breastfeeding? Unknown   BMI 21.61 kg/m  Fundus firm  Recent Labs    07/20/17 2315  HGB 9.4*  HCT 30.5*     Plan: discharge today - postpartum care discussed - f/u clinic in 6 weeks for postpartum visit   Cam Hai, CNM 10:54 AM 07/23/2017

## 2017-09-02 ENCOUNTER — Encounter: Payer: Self-pay | Admitting: Nurse Practitioner

## 2017-09-02 ENCOUNTER — Ambulatory Visit (INDEPENDENT_AMBULATORY_CARE_PROVIDER_SITE_OTHER): Payer: Medicaid Other | Admitting: Nurse Practitioner

## 2017-09-02 ENCOUNTER — Other Ambulatory Visit: Payer: Self-pay

## 2017-09-02 DIAGNOSIS — Z1389 Encounter for screening for other disorder: Secondary | ICD-10-CM

## 2017-09-02 DIAGNOSIS — Z3042 Encounter for surveillance of injectable contraceptive: Secondary | ICD-10-CM | POA: Insufficient documentation

## 2017-09-02 NOTE — Patient Instructions (Signed)
Etonogestrel implant What is this medicine? ETONOGESTREL (et oh noe JES trel) is a contraceptive (birth control) device. It is used to prevent pregnancy. It can be used for up to 3 years. This medicine may be used for other purposes; ask your health care provider or pharmacist if you have questions. COMMON BRAND NAME(S): Implanon, Nexplanon What should I tell my health care provider before I take this medicine? They need to know if you have any of these conditions: -abnormal vaginal bleeding -blood vessel disease or blood clots -cancer of the breast, cervix, or liver -depression -diabetes -gallbladder disease -headaches -heart disease or recent heart attack -high blood pressure -high cholesterol -kidney disease -liver disease -renal disease -seizures -tobacco smoker -an unusual or allergic reaction to etonogestrel, other hormones, anesthetics or antiseptics, medicines, foods, dyes, or preservatives -pregnant or trying to get pregnant -breast-feeding How should I use this medicine? This device is inserted just under the skin on the inner side of your upper arm by a health care professional. Talk to your pediatrician regarding the use of this medicine in children. Special care may be needed. Overdosage: If you think you have taken too much of this medicine contact a poison control center or emergency room at once. NOTE: This medicine is only for you. Do not share this medicine with others. What if I miss a dose? This does not apply. What may interact with this medicine? Do not take this medicine with any of the following medications: -amprenavir -bosentan -fosamprenavir This medicine may also interact with the following medications: -barbiturate medicines for inducing sleep or treating seizures -certain medicines for fungal infections like ketoconazole and itraconazole -grapefruit juice -griseofulvin -medicines to treat seizures like carbamazepine, felbamate, oxcarbazepine,  phenytoin, topiramate -modafinil -phenylbutazone -rifampin -rufinamide -some medicines to treat HIV infection like atazanavir, indinavir, lopinavir, nelfinavir, tipranavir, ritonavir -St. John's wort This list may not describe all possible interactions. Give your health care provider a list of all the medicines, herbs, non-prescription drugs, or dietary supplements you use. Also tell them if you smoke, drink alcohol, or use illegal drugs. Some items may interact with your medicine. What should I watch for while using this medicine? This product does not protect you against HIV infection (AIDS) or other sexually transmitted diseases. You should be able to feel the implant by pressing your fingertips over the skin where it was inserted. Contact your doctor if you cannot feel the implant, and use a non-hormonal birth control method (such as condoms) until your doctor confirms that the implant is in place. If you feel that the implant may have broken or become bent while in your arm, contact your healthcare provider. What side effects may I notice from receiving this medicine? Side effects that you should report to your doctor or health care professional as soon as possible: -allergic reactions like skin rash, itching or hives, swelling of the face, lips, or tongue -breast lumps -changes in emotions or moods -depressed mood -heavy or prolonged menstrual bleeding -pain, irritation, swelling, or bruising at the insertion site -scar at site of insertion -signs of infection at the insertion site such as fever, and skin redness, pain or discharge -signs of pregnancy -signs and symptoms of a blood clot such as breathing problems; changes in vision; chest pain; severe, sudden headache; pain, swelling, warmth in the leg; trouble speaking; sudden numbness or weakness of the face, arm or leg -signs and symptoms of liver injury like dark yellow or brown urine; general ill feeling or flu-like symptoms;  light-colored   stools; loss of appetite; nausea; right upper belly pain; unusually weak or tired; yellowing of the eyes or skin -unusual vaginal bleeding, discharge -signs and symptoms of a stroke like changes in vision; confusion; trouble speaking or understanding; severe headaches; sudden numbness or weakness of the face, arm or leg; trouble walking; dizziness; loss of balance or coordination Side effects that usually do not require medical attention (report to your doctor or health care professional if they continue or are bothersome): -acne -back pain -breast pain -changes in weight -dizziness -general ill feeling or flu-like symptoms -headache -irregular menstrual bleeding -nausea -sore throat -vaginal irritation or inflammation This list may not describe all possible side effects. Call your doctor for medical advice about side effects. You may report side effects to FDA at 1-800-FDA-1088. Where should I keep my medicine? This drug is given in a hospital or clinic and will not be stored at home. NOTE: This sheet is a summary. It may not cover all possible information. If you have questions about this medicine, talk to your doctor, pharmacist, or health care provider.  2018 Elsevier/Gold Standard (2015-09-11 11:19:22)  

## 2017-09-02 NOTE — Progress Notes (Signed)
Post Partum Exam  [name] is a 27 y.o. [phone] female who presents for a postpartum visit. She is 6 week postpartum following a spontaneous vaginal delivery. I have fully reviewed the prenatal and intrapartum course. The delivery was at 39 gestational weeks.  Anesthesia: none. Postpartum course has been normal. Baby's course has been great. Baby is feeding by both breast and bottle - Gerber gentle. Pumps breastmilk also. Bleeding no bleeding. Bowel function is normal. Bladder function is normal. Patient is not sexually active. Contraception method is Depo-Provera injections.  Received one injection in the immediate postpartum hospitalization. Postpartum depression screening:neg  The following portions of the patient's history were reviewed and updated as appropriate: allergies, current medications, past family history, past medical history, past social history, past surgical history and problem list. pap smear due 09-2017   Review of Systems Pertinent items are noted in HPI.    Objective:  Blood pressure 111/69, pulse 72, weight 105 lb (47.6 kg), unknown if currently breastfeeding.  General:  alert, cooperative and no distress   Breasts:  inspection negative, no nipple discharge or bleeding, no masses or nodularity palpable  Lungs: clear to auscultation bilaterally  Heart:  regular rate and rhythm, S1, S2 normal, no murmur, click, rub or gallop  Abdomen: soft, non-tender; bowel sounds normal; no masses,  no organomegaly   Vulva:  not evaluated  Vagina: not evaluated  Cervix:  not evaluated  Corpus: not examined  Adnexa:  not evaluated  Rectal Exam: Not performed.        Assessment:    Normal postpartum exam. Pap smear not done at today's visit.  Anemia during pregnancy  Plan:   1. Contraception: Depo-Provera injections - first one on 07-23-17 2. Discussed getting Nexplanon 2 weeks prior to Depo being due so Depo can serve as her backup method when Nexplanon is inserted.  She is  doing well with Depo and would predict she would do well with Nexplanon. Written information given on Nexplanon. 3. Follow up in: August - dates put in AVS for next Depo injection or insertion of Nexplanon or as needed.

## 2017-09-02 NOTE — Progress Notes (Deleted)
Post Partum Exam  [name] Mackenzie Spatzis a 27 y.o. [phone] female who presents for a postpartum visit. She is {1-10:13787} {time; units:18646} postpartum following a {delivery:12449}. I have fully reviewed the prenatal and intrapartum course. The delivery was at *** gestational weeks.  Anesthesia: {anesthesia types:812}. Postpartum course has been ***. Baby's course has been ***. Baby is feeding by {breast/bottle:69}. Bleeding {vag bleed:12292}. Bowel function is {normal:32111}. Bladder function is {normal:32111}. Patient {is/is not:9024} sexually active. Contraception method is {contraceptive method:5051}. Postpartum depression screening:neg  {Common ambulatory SmartLinks:19316} Last pap smear done *** and was {Desc; normal/abnormal:11317::"Normal"}  Review of Systems {ros; complete:30496}    Objective:  unknown if currently breastfeeding.  General:  {gen appearance:16600}   Breasts:  {breast exam:1202::"inspection negative, no nipple discharge or bleeding, no masses or nodularity palpable"}  Lungs: {lung exam:16931}  Heart:  {heart exam:5510}  Abdomen: {abdomen exam:16834}   Vulva:  {labia exam:12198}  Vagina: {vagina exam:12200}  Cervix:  {cervix exam:14595}  Corpus: {uterus exam:12215}  Adnexa:  {adnexa exam:12223}  Rectal Exam: {rectal/vaginal exam:12274}        Assessment:    *** postpartum exam. Pap smear {done:10129} at today's visit.   Plan:   1. Contraception: {method:5051} 2. *** 3. Follow up in: {1-10:13787} {time; units:19136} or as needed.

## 2017-10-18 ENCOUNTER — Ambulatory Visit: Payer: Medicaid Other

## 2018-03-08 NOTE — L&D Delivery Note (Signed)
OB/GYN Faculty Practice Delivery Note  Rosezetta Balderston is a 28 y.o. [id number] s/p SVD at [redacted]w[redacted]d. She was admitted for SOL.   ROM: 0h 54m with clear fluid GBS Status: Negative/-- (09/23 1143) Maximum Maternal Temperature: 98.9F  Labor Progress: . Patient presented to L&D for SOL. Initial SVE: 5/50/-3. She received AROM at 7 cm and then progressed to complete within minutes.    Delivery Date/Time: 9/30 @ 0528 Delivery: Head delivered in ROA position. No nuchal cord present. Shoulder and body delivered in usual fashion. Infant with spontaneous cry, placed on mother's abdomen, dried and stimulated. Cord clamped x 2 after 1-minute delay, and cut by FOB. Cord blood drawn. Placenta delivered spontaneously with gentle cord traction. Fundus firm with massage and Pitocin. Labia, perineum, vagina, and cervix inspected inspected with no lacerations.  Baby Weight: pending  Placenta: Sent to L&D Complications: None Lacerations: None EBL: 100 mL Analgesia: None  Infant: APGAR (1 MIN): 9   APGAR (5 MINS): 9   APGAR (10 MINS):     Barrington Ellison, MD [facility] Family Medicine Fellow, [facility] 12/06/2018, 5:51 AM

## 2018-08-04 ENCOUNTER — Other Ambulatory Visit: Payer: Self-pay

## 2018-08-04 ENCOUNTER — Ambulatory Visit (INDEPENDENT_AMBULATORY_CARE_PROVIDER_SITE_OTHER): Payer: Medicaid Other | Admitting: *Deleted

## 2018-08-04 ENCOUNTER — Encounter: Payer: Self-pay | Admitting: Obstetrics & Gynecology

## 2018-08-04 VITALS — BP 108/62 | HR 77 | Temp 98.5°F | Ht 63.0 in | Wt 112.1 lb

## 2018-08-04 DIAGNOSIS — Z3201 Encounter for pregnancy test, result positive: Secondary | ICD-10-CM

## 2018-08-04 LAB — POCT PREGNANCY, URINE: Preg Test, Ur: POSITIVE — AB

## 2018-08-04 NOTE — Progress Notes (Signed)
Pt present for pregnancy test.  Pregnancy test resulted positive.   LMP: 04/23/18 EDD:01/28/19 GA:[redacted]w[redacted]d  Allergies and medications reviewed.  Pt states she is taking prenatal vitamins.  Pt advised to begin prenatal care.

## 2018-08-07 ENCOUNTER — Other Ambulatory Visit: Payer: Self-pay

## 2018-08-07 ENCOUNTER — Telehealth (INDEPENDENT_AMBULATORY_CARE_PROVIDER_SITE_OTHER): Payer: Medicaid Other | Admitting: *Deleted

## 2018-08-07 DIAGNOSIS — O0990 Supervision of high risk pregnancy, unspecified, unspecified trimester: Secondary | ICD-10-CM

## 2018-08-07 NOTE — Progress Notes (Signed)
6834 I called Arlette mobile and then home numbers and left messages that I am calling for her virtual visit and will call again in a few minutes- please be available by phone.  Linda,RN  I connected with  Magdalen Spatz on 08/07/18 at 8:30 by telephone and verified that I am speaking with the correct person using two identifiers.   I discussed the limitations, risks, security and privacy concerns of performing an evaluation and management service by telephone and virtually and the availability of in person appointments. I also discussed with the patient that there may be a patient responsible charge related to this service. The patient expressed understanding and agreed to proceed.Due to issues with accessing camera I informed her I will need to switch computers and call her back.  Linda,RN 08/07/2018  8:31 AM    8:45 I called J[facility] home and mobile number twice each and no answer- left message that since I cannot reach her will need to reschedule her visit - please call our office. I did see that it appears she connected virtually while I was changing computers but had left virtual room. Legrand Como

## 2018-08-08 ENCOUNTER — Ambulatory Visit (INDEPENDENT_AMBULATORY_CARE_PROVIDER_SITE_OTHER): Payer: Medicaid Other | Admitting: *Deleted

## 2018-08-08 ENCOUNTER — Other Ambulatory Visit: Payer: Self-pay

## 2018-08-08 DIAGNOSIS — O0990 Supervision of high risk pregnancy, unspecified, unspecified trimester: Secondary | ICD-10-CM

## 2018-08-08 DIAGNOSIS — D649 Anemia, unspecified: Secondary | ICD-10-CM

## 2018-08-08 DIAGNOSIS — O09899 Supervision of other high risk pregnancies, unspecified trimester: Secondary | ICD-10-CM | POA: Insufficient documentation

## 2018-08-08 HISTORY — DX: Supervision of other high risk pregnancies, unspecified trimester: O09.899

## 2018-08-08 HISTORY — DX: Supervision of high risk pregnancy, unspecified, unspecified trimester: O09.90

## 2018-08-08 MED ORDER — AMBULATORY NON FORMULARY MEDICATION: 0 refills | Status: DC

## 2018-08-08 NOTE — Progress Notes (Signed)
I connected with  [name] on 08/08/18 at  1:15 PM EDT by telephone and verified that I am speaking with the correct person using two identifiers.   I discussed the limitations, risks, security and privacy concerns of performing an evaluation and management service by telephone and the availability of in person appointments. I also discussed with the patient that there may be a patient responsible charge related to this service. The patient expressed understanding and agreed to proceed.  New Ob intake completed. Pt advised that her prenatal care will consist of a combination of virtual visits as well as face to face visits in office. Pt has Hx of PTL & PTD x3. Last Pap 2016. Pt agreed to register for BabyRx App and to enter BP information weekly. BP cuff ordered and will be sent to pt. [place] for anatomy scheduled on 6/29 @ 1045. Pt voiced understanding of all information and instructions given.   Jeryn Bertoni, [name], RN 08/08/2018  3:35 PM

## 2018-08-10 NOTE — Progress Notes (Signed)
I have reviewed this chart and agree with the RN/CMA assessment and management.    Addylynn Balin C Lachina Salsberry, MD, FACOG Attending Physician, Faculty Practice [facility]  

## 2018-08-10 NOTE — Progress Notes (Signed)
I have reviewed this chart and agree with the RN/CMA assessment and management.    Blanchard Willhite C Tamyia Minich, MD, FACOG Attending Physician, Faculty Practice [facility]  

## 2018-08-12 ENCOUNTER — Other Ambulatory Visit: Payer: Self-pay

## 2018-08-14 ENCOUNTER — Encounter: Payer: Self-pay | Admitting: Emergency Medicine

## 2018-08-14 ENCOUNTER — Telehealth: Payer: Self-pay | Admitting: Emergency Medicine

## 2018-08-14 NOTE — Telephone Encounter (Signed)
Phone call made to pt today regarding refill request for blood pressure cuff. Attempted to call pt on both numbers provided and left voice mails informing pt of attempt to reach regarding her request. Pt was also encouraged to call with any further questions or comments. Mychart message sent regarding request.

## 2018-08-16 ENCOUNTER — Other Ambulatory Visit (HOSPITAL_COMMUNITY)
Admission: RE | Admit: 2018-08-16 | Discharge: 2018-08-16 | Disposition: A | Discharge status: Home / Self Care | Payer: Medicaid Other | Source: Ambulatory Visit | Attending: Obstetrics & Gynecology | Admitting: Obstetrics & Gynecology

## 2018-08-16 ENCOUNTER — Other Ambulatory Visit: Payer: Self-pay

## 2018-08-16 ENCOUNTER — Ambulatory Visit (INDEPENDENT_AMBULATORY_CARE_PROVIDER_SITE_OTHER): Payer: Medicaid Other | Admitting: Obstetrics & Gynecology

## 2018-08-16 ENCOUNTER — Encounter (HOSPITAL_COMMUNITY): Payer: Self-pay | Admitting: Obstetrics and Gynecology

## 2018-08-16 ENCOUNTER — Telehealth: Payer: Self-pay | Admitting: Obstetrics & Gynecology

## 2018-08-16 VITALS — BP 95/62 | HR 86 | Wt 111.2 lb

## 2018-08-16 DIAGNOSIS — O0992 Supervision of high risk pregnancy, unspecified, second trimester: Secondary | ICD-10-CM

## 2018-08-16 DIAGNOSIS — Z3A16 16 weeks gestation of pregnancy: Secondary | ICD-10-CM

## 2018-08-16 DIAGNOSIS — Z3482 Encounter for supervision of other normal pregnancy, second trimester: Secondary | ICD-10-CM | POA: Diagnosis not present

## 2018-08-16 DIAGNOSIS — O09899 Supervision of other high risk pregnancies, unspecified trimester: Secondary | ICD-10-CM

## 2018-08-16 DIAGNOSIS — O0990 Supervision of high risk pregnancy, unspecified, unspecified trimester: Secondary | ICD-10-CM | POA: Diagnosis not present

## 2018-08-16 DIAGNOSIS — O09212 Supervision of pregnancy with history of pre-term labor, second trimester: Secondary | ICD-10-CM | POA: Diagnosis not present

## 2018-08-16 DIAGNOSIS — N898 Other specified noninflammatory disorders of vagina: Secondary | ICD-10-CM | POA: Diagnosis not present

## 2018-08-16 LAB — POCT URINALYSIS DIP (DEVICE)
Bilirubin Urine: NEGATIVE
Glucose, UA: NEGATIVE mg/dL
Ketones, ur: NEGATIVE mg/dL
Nitrite: POSITIVE — AB
Protein, ur: NEGATIVE mg/dL
Specific Gravity, Urine: 1.015 (ref 1.005–1.030)
Urobilinogen, UA: 1 mg/dL (ref 0.0–1.0)
pH: 7 (ref 5.0–8.0)

## 2018-08-16 MED ORDER — METRONIDAZOLE 500 MG PO TABS: 500.0000 mg | ORAL_TABLET | Freq: Two times a day (BID) | ORAL | 0 refills | Status: DC

## 2018-08-16 NOTE — Telephone Encounter (Signed)
Patient was called and given the information for her visit on today.

## 2018-08-16 NOTE — Progress Notes (Signed)
  Subjective:    [name] is a single P4 (5,4, 3, and 26 yo kids) being seen today for her first obstetrical visit.  This is not a planned pregnancy. She is at [redacted]w[redacted]d gestation. Her obstetrical history is significant for h/o PTD 3, last baby at 39 weeks with 17 P. Relationship with FOB: significant other, living together. FOB same as other kids.  Patient does intend to breast feed. She beastfed the other children. Pregnancy history fully reviewed.  Patient reports no complaints. She felt FM for about a week.  Review of Systems:   Review of Systems  Homemaker  Objective:     BP 95/62   Pulse 86   Wt 111 lb 3.2 oz (50.4 kg)   LMP 04/23/2018 (Exact Date)   BMI 19.70 kg/m  Physical Exam  Exam  Breathing, conversing, and ambulating normally Well nourished, well hydrated Black female, no apparent distress Heart- rrr Lungs- CTAB Abd- benign, gravid FH-24 Frothy vaginal discharge   Assessment:    Pregnancy: [id number] Patient Active Problem List   Diagnosis Date Noted  . Supervision of high risk pregnancy, antepartum 08/08/2018  . History of preterm delivery, currently pregnant 08/08/2018  . Anemia 07/06/2017       Plan:     Initial labs drawn. Prenatal vitamins. Problem list reviewed and updated. NIPS today Role of ultrasound in pregnancy discussed; fetal survey: scheduled for first available 17 P ordered Follow up in 4 weeks, virtual BP cuff to be mailed to her. She has a scale at home. Flagyl ordred Wet prep sent Babyscripts Pap done today Come back for 2 hour GTT   Dalaya Suppa C Nainika Newlun 08/16/2018

## 2018-08-16 NOTE — Progress Notes (Signed)
Educated patient how to use BP cuff, report high readings & how to log in babyscripts. Also explained how to self inject 17p, hand hygiene, proper cleaning & disposal. Patient verbalized understanding.

## 2018-08-18 ENCOUNTER — Other Ambulatory Visit (HOSPITAL_COMMUNITY): Payer: Self-pay | Admitting: *Deleted

## 2018-08-18 DIAGNOSIS — O09212 Supervision of pregnancy with history of pre-term labor, second trimester: Secondary | ICD-10-CM

## 2018-08-18 LAB — CERVICOVAGINAL ANCILLARY ONLY
Bacterial vaginitis: POSITIVE — AB
Candida vaginitis: NEGATIVE
Trichomonas: NEGATIVE

## 2018-08-18 LAB — CYTOLOGY - PAP
Chlamydia: NEGATIVE
Diagnosis: NEGATIVE
Neisseria Gonorrhea: NEGATIVE

## 2018-08-20 LAB — CULTURE, OB URINE

## 2018-08-20 LAB — URINE CULTURE, OB REFLEX

## 2018-08-21 ENCOUNTER — Encounter (HOSPITAL_COMMUNITY): Payer: Self-pay

## 2018-08-21 ENCOUNTER — Ambulatory Visit (HOSPITAL_COMMUNITY): Payer: Medicaid Other

## 2018-08-21 ENCOUNTER — Other Ambulatory Visit: Payer: Self-pay | Admitting: Obstetrics & Gynecology

## 2018-08-21 MED ORDER — SULFAMETHOXAZOLE-TRIMETHOPRIM 800-160 MG PO TABS: 1.0000 | ORAL_TABLET | Freq: Two times a day (BID) | ORAL | 0 refills | Status: DC

## 2018-08-21 NOTE — Progress Notes (Unsigned)
Bactrim ds prescribed for Ecoli UTI.

## 2018-08-25 LAB — OBSTETRIC PANEL, INCLUDING HIV
Antibody Screen: NEGATIVE
Basophils Absolute: 0 10*3/uL (ref 0.0–0.2)
Basos: 1 %
EOS (ABSOLUTE): 0.1 10*3/uL (ref 0.0–0.4)
Eos: 1 %
HIV Screen 4th Generation wRfx: NONREACTIVE
Hematocrit: 31.2 % — ABNORMAL LOW (ref 34.0–46.6)
Hemoglobin: 10.5 g/dL — ABNORMAL LOW (ref 11.1–15.9)
Hepatitis B Surface Ag: NEGATIVE
Immature Grans (Abs): 0.1 10*3/uL (ref 0.0–0.1)
Immature Granulocytes: 1 %
Lymphocytes Absolute: 1.5 10*3/uL (ref 0.7–3.1)
Lymphs: 18 %
MCH: 27.4 pg (ref 26.6–33.0)
MCHC: 33.7 g/dL (ref 31.5–35.7)
MCV: 82 fL (ref 79–97)
Monocytes Absolute: 0.4 10*3/uL (ref 0.1–0.9)
Monocytes: 5 %
Neutrophils Absolute: 6.1 10*3/uL (ref 1.4–7.0)
Neutrophils: 74 %
Platelets: 259 10*3/uL (ref 150–450)
RBC: 3.83 x10E6/uL (ref 3.77–5.28)
RDW: 12.6 % (ref 11.7–15.4)
RPR Ser Ql: NONREACTIVE
Rh Factor: POSITIVE
Rubella Antibodies, IGG: 1.51 index (ref >0.99)
WBC: 8.2 10*3/uL (ref 3.4–10.8)

## 2018-08-25 LAB — CYSTIC FIBROSIS MUTATION 97: Interpretation: NOT DETECTED

## 2018-08-28 ENCOUNTER — Ambulatory Visit (INDEPENDENT_AMBULATORY_CARE_PROVIDER_SITE_OTHER)
Admission: RE | Admit: 2018-08-28 | Discharge: 2018-08-28 | Disposition: A | Discharge status: Home / Self Care | Payer: Medicaid Other | Source: Ambulatory Visit

## 2018-08-28 DIAGNOSIS — T8089XA Other complications following infusion, transfusion and therapeutic injection, initial encounter: Secondary | ICD-10-CM

## 2018-08-28 DIAGNOSIS — M79603 Pain in arm, unspecified: Secondary | ICD-10-CM | POA: Diagnosis not present

## 2018-08-28 DIAGNOSIS — M7989 Other specified soft tissue disorders: Secondary | ICD-10-CM

## 2018-08-28 DIAGNOSIS — R2230 Localized swelling, mass and lump, unspecified upper limb: Secondary | ICD-10-CM

## 2018-08-28 NOTE — Discharge Instructions (Addendum)
Instructed to put ice on the area a few times a day for the swelling Reassured that this could last for a few weeks For continued or worsening symptoms she will need to be seen in person.

## 2018-08-28 NOTE — ED Provider Notes (Signed)
Virtual Visit via Video Note:  [name]  initiated request for Telemedicine visit with [facility]gent [facility] team. I connected with Emmit Pomfret  on 08/28/2018 at 3:23 PM  for a synchronized telemedicine visit using a video enabled HIPPA compliant telemedicine application. I verified that I am speaking with Emmit Pomfret  using two identifiers. Orvan July, NP  was physically located in a [facility] Urgent care site and Latrenda Irani was located at a different location.   The limitations of evaluation and management by telemedicine as well as the availability of in-person appointments were discussed. Patient was informed that she  may incur a bill ( including co-pay) for this virtual visit encounter. Emmit Pomfret  expressed understanding and gave verbal consent to proceed with virtual visit.     History of Present Illness:[name]  is a 28 y.o. female presents with reaction to progesterone injection.  She received the injection a week ago and has since had swelling to the arm and mild discomfort. She notes some redness. She is able to move the arm.  No fever.     Past Medical History:  Diagnosis Date  . Anemia    iron   . History of preterm delivery, currently pregnant, unspecified trimester 04/04/2017    too late for [facility], started prometrium  . No pertinent past medical history   . Preterm delivery   . Stillbirth   . UTI (lower urinary tract infection)   . Vaginal wall cyst 01/02/2015   See photo in note from 01/01/15     Allergies  Allergen Reactions  . Cherry Hives and Itching        Observations/Objective:GENERAL APPEARANCE: Well developed, well nourished, alert and cooperative, and appears to be in no acute distress. HEAD: normocephalic. Non labored breathing, no dyspnea or distress Skin: Skin normal color, notable swelling to arm over E visit.  Patient able to do range of motion with the arm without difficulty.  Patient smiling during entire  visit PSYCHIATRIC: The mental examination revealed the patient was oriented to person, place, and time. The patient was able to demonstrate good judgement and reason, without hallucinations, abnormal affect or abnormal behaviors during the examination. Patient is not suicidal     Assessment and Plan: Localized reaction to injection.  Reassured patient that this sort of reaction can be normal after an injection.  Symptoms he could take a few weeks to a month before the area heals.  Recommended using ice a few times a day swelling and inflammation. If symptoms continue or worsen she will need to be seen in person for evaluation   Follow Up Instructions: Follow up as needed for continued or worsening symptoms     I discussed the assessment and treatment plan with the patient. The patient was provided an opportunity to ask questions and all were answered. The patient agreed with the plan and demonstrated an understanding of the instructions.   The patient was advised to call back or seek an in-person evaluation if the symptoms worsen or if the condition fails to improve as anticipated.      Orvan July, NP  08/28/2018 3:23 PM         Orvan July, NP 08/28/18 1523

## 2018-08-29 ENCOUNTER — Encounter: Payer: Self-pay | Admitting: *Deleted

## 2018-09-04 ENCOUNTER — Ambulatory Visit (HOSPITAL_COMMUNITY): Payer: Medicaid Other

## 2018-09-04 ENCOUNTER — Other Ambulatory Visit (HOSPITAL_COMMUNITY): Payer: Medicaid Other

## 2018-09-04 DIAGNOSIS — O0990 Supervision of high risk pregnancy, unspecified, unspecified trimester: Secondary | ICD-10-CM | POA: Diagnosis not present

## 2018-09-07 ENCOUNTER — Ambulatory Visit (HOSPITAL_COMMUNITY): Payer: Medicaid Other

## 2018-09-07 ENCOUNTER — Ambulatory Visit (HOSPITAL_COMMUNITY)
Admission: RE | Admit: 2018-09-07 | Discharge: 2018-09-07 | Disposition: A | Discharge status: Home / Self Care | Payer: Medicaid Other | Source: Ambulatory Visit | Attending: Obstetrics and Gynecology | Admitting: Obstetrics and Gynecology

## 2018-09-11 ENCOUNTER — Other Ambulatory Visit: Payer: Self-pay | Admitting: *Deleted

## 2018-09-11 DIAGNOSIS — O0990 Supervision of high risk pregnancy, unspecified, unspecified trimester: Secondary | ICD-10-CM

## 2018-09-13 ENCOUNTER — Telehealth: Payer: Self-pay | Admitting: Obstetrics & Gynecology

## 2018-09-13 NOTE — Telephone Encounter (Signed)
Attempted to call patient about her appointment on 7/9 @ 8:50. No answer, left voicemail instructing patient to wear a face mask for the entire appointment and no visitors are allowed during the visit. Patient instructed not to attend the appointment if she is experiencing any symptoms. Symptom list and office number left.

## 2018-09-14 ENCOUNTER — Other Ambulatory Visit: Payer: Medicaid Other

## 2018-09-14 ENCOUNTER — Encounter: Payer: Medicaid Other | Admitting: Obstetrics & Gynecology

## 2018-09-19 ENCOUNTER — Ambulatory Visit (HOSPITAL_COMMUNITY)
Admission: RE | Admit: 2018-09-19 | Discharge: 2018-09-19 | Disposition: A | Discharge status: Home / Self Care | Payer: Medicaid Other | Source: Ambulatory Visit | Attending: Obstetrics and Gynecology | Admitting: Obstetrics and Gynecology

## 2018-09-19 ENCOUNTER — Other Ambulatory Visit (HOSPITAL_COMMUNITY): Payer: Self-pay | Admitting: *Deleted

## 2018-09-19 ENCOUNTER — Ambulatory Visit (HOSPITAL_COMMUNITY): Payer: Medicaid Other | Admitting: *Deleted

## 2018-09-19 ENCOUNTER — Encounter (HOSPITAL_COMMUNITY): Payer: Self-pay

## 2018-09-19 ENCOUNTER — Other Ambulatory Visit: Payer: Self-pay

## 2018-09-19 VITALS — BP 105/63 | HR 107 | Temp 98.8°F

## 2018-09-19 DIAGNOSIS — O09212 Supervision of pregnancy with history of pre-term labor, second trimester: Secondary | ICD-10-CM

## 2018-09-19 DIAGNOSIS — O0990 Supervision of high risk pregnancy, unspecified, unspecified trimester: Secondary | ICD-10-CM | POA: Insufficient documentation

## 2018-09-19 DIAGNOSIS — O09899 Supervision of other high risk pregnancies, unspecified trimester: Secondary | ICD-10-CM

## 2018-09-19 DIAGNOSIS — Z3A26 26 weeks gestation of pregnancy: Secondary | ICD-10-CM

## 2018-09-19 DIAGNOSIS — O09219 Supervision of pregnancy with history of pre-term labor, unspecified trimester: Secondary | ICD-10-CM | POA: Insufficient documentation

## 2018-09-19 DIAGNOSIS — Z8751 Personal history of pre-term labor: Secondary | ICD-10-CM

## 2018-10-18 ENCOUNTER — Encounter (HOSPITAL_COMMUNITY): Payer: Self-pay

## 2018-10-18 ENCOUNTER — Ambulatory Visit (HOSPITAL_COMMUNITY): Payer: Medicaid Other | Admitting: *Deleted

## 2018-10-18 ENCOUNTER — Other Ambulatory Visit: Payer: Self-pay

## 2018-10-18 ENCOUNTER — Ambulatory Visit (HOSPITAL_COMMUNITY)
Admission: RE | Admit: 2018-10-18 | Discharge: 2018-10-18 | Disposition: A | Discharge status: Home / Self Care | Payer: Medicaid Other | Source: Ambulatory Visit | Attending: Obstetrics and Gynecology | Admitting: Obstetrics and Gynecology

## 2018-10-18 DIAGNOSIS — O0990 Supervision of high risk pregnancy, unspecified, unspecified trimester: Secondary | ICD-10-CM

## 2018-10-18 DIAGNOSIS — Z362 Encounter for other antenatal screening follow-up: Secondary | ICD-10-CM | POA: Diagnosis not present

## 2018-10-18 DIAGNOSIS — O09213 Supervision of pregnancy with history of pre-term labor, third trimester: Secondary | ICD-10-CM

## 2018-10-18 DIAGNOSIS — O09219 Supervision of pregnancy with history of pre-term labor, unspecified trimester: Secondary | ICD-10-CM | POA: Diagnosis not present

## 2018-10-18 DIAGNOSIS — O09899 Supervision of other high risk pregnancies, unspecified trimester: Secondary | ICD-10-CM

## 2018-10-18 DIAGNOSIS — Z3A30 30 weeks gestation of pregnancy: Secondary | ICD-10-CM | POA: Diagnosis not present

## 2018-10-18 DIAGNOSIS — Z8751 Personal history of pre-term labor: Secondary | ICD-10-CM | POA: Diagnosis not present

## 2018-11-20 ENCOUNTER — Other Ambulatory Visit: Payer: Self-pay

## 2018-11-20 ENCOUNTER — Inpatient Hospital Stay (HOSPITAL_COMMUNITY)
Admission: AD | Admit: 2018-11-20 | Discharge: 2018-11-20 | Disposition: A | Discharge status: Home / Self Care | Payer: Medicaid Other | Attending: Obstetrics & Gynecology | Admitting: Obstetrics & Gynecology

## 2018-11-20 ENCOUNTER — Encounter (HOSPITAL_COMMUNITY): Payer: Self-pay

## 2018-11-20 DIAGNOSIS — Z3A35 35 weeks gestation of pregnancy: Secondary | ICD-10-CM

## 2018-11-20 DIAGNOSIS — O09899 Supervision of other high risk pregnancies, unspecified trimester: Secondary | ICD-10-CM

## 2018-11-20 DIAGNOSIS — O4703 False labor before 37 completed weeks of gestation, third trimester: Secondary | ICD-10-CM

## 2018-11-20 DIAGNOSIS — O09213 Supervision of pregnancy with history of pre-term labor, third trimester: Secondary | ICD-10-CM | POA: Diagnosis not present

## 2018-11-20 DIAGNOSIS — O0993 Supervision of high risk pregnancy, unspecified, third trimester: Secondary | ICD-10-CM

## 2018-11-20 DIAGNOSIS — O0990 Supervision of high risk pregnancy, unspecified, unspecified trimester: Secondary | ICD-10-CM

## 2018-11-20 LAB — URINALYSIS, ROUTINE W REFLEX MICROSCOPIC
Bilirubin Urine: NEGATIVE
Glucose, UA: NEGATIVE mg/dL
Hgb urine dipstick: NEGATIVE
Ketones, ur: NEGATIVE mg/dL
Nitrite: NEGATIVE
Protein, ur: NEGATIVE mg/dL
Specific Gravity, Urine: 1.003 — ABNORMAL LOW (ref 1.005–1.030)
pH: 7 (ref 5.0–8.0)

## 2018-11-20 MED ORDER — NIFEDIPINE 10 MG PO CAPS
10.0000 mg | ORAL_CAPSULE | ORAL | Status: DC
  Administered 2018-11-20 (×2): 10 mg via ORAL
  Filled 2018-11-20 (×3): qty 1

## 2018-11-20 MED ORDER — TERBUTALINE SULFATE 1 MG/ML IJ SOLN
0.2500 mg | Freq: Once | INTRAMUSCULAR | Status: AC
Start: 2018-11-20 — End: 2018-11-20
  Administered 2018-11-20: 0.25 mg via SUBCUTANEOUS
  Filled 2018-11-20: qty 1

## 2018-11-20 MED ORDER — BETAMETHASONE SOD PHOS & ACET 6 (3-3) MG/ML IJ SUSP
12.0000 mg | INTRAMUSCULAR | Status: DC
  Administered 2018-11-20: 12 mg via INTRAMUSCULAR
  Filled 2018-11-20: qty 2

## 2018-11-20 MED ORDER — NIFEDIPINE 10 MG PO CAPS: 10.0000 mg | ORAL_CAPSULE | Freq: Three times a day (TID) | ORAL | 0 refills | Status: DC

## 2018-11-20 NOTE — MAU Note (Signed)
Contractions are 4 min apart. Regular since about 2. No bleeding or water leaking.  Hx of preterm deliveries.

## 2018-11-20 NOTE — Discharge Instructions (Signed)
Please return to MAU at 6:00 PM on 11/21/2018 for your 2nd Betamethasone injection.

## 2018-11-20 NOTE — Progress Notes (Signed)
Repositioned cardio

## 2018-11-20 NOTE — MAU Provider Note (Signed)
History     CSN: [id number]  Arrival date and time: 11/20/18 1808   First Provider Initiated Contact with Patient 11/20/18 1938      Chief Complaint  Patient presents with  . Contractions   HPI  Ms.  [name] is a 28 y.o. year old G6P1314 female at [redacted]w[redacted]d weeks gestation who presents to MAU reporting she started having regular UC's every 4 mins since 1400 today. She has a h/o 1 term delivery and 3 PTDs (33 wk, 34 wks & 35 wks). She denies any recent SI. She denies VB or LOF. She receives HROB care at [facility]. She receives weekly 17-P injections for h/o PTD. FOB present for history taking, assessment and exam.  Past Medical History:  Diagnosis Date  . Anemia    iron   . History of preterm delivery, currently pregnant, unspecified trimester 04/04/2017    too late for [facility], started prometrium  . No pertinent past medical history   . Preterm delivery   . Stillbirth   . UTI (lower urinary tract infection)   . Vaginal wall cyst 01/02/2015   See photo in note from 01/01/15     Past Surgical History:  Procedure Laterality Date  . NO PAST SURGERIES      Family History  Problem Relation Age of Onset  . Hypertension Mother     Social History   Tobacco Use  . Smoking status: Never Smoker  . Smokeless tobacco: Never Used  Substance Use Topics  . Alcohol use: No  . Drug use: No    Allergies:  Allergies  Allergen Reactions  . Cherry Hives and Itching    Medications Prior to Admission  Medication Sig Dispense Refill Last Dose  . AMBULATORY NON FORMULARY MEDICATION Blood pressure cuff/Monitored regularly at home.  ICD 10: O09.90 1 kit 0 Past Week at Unknown time  . ferrous gluconate (FERGON) 324 MG tablet Take 1 tablet (324 mg total) by mouth 2 (two) times daily with a meal. 60 tablet 1 11/19/2018 at 1300  . prenatal vitamin w/FE, FA (PRENATAL 1 + 1) 27-1 MG TABS tablet Take 1 tablet by mouth daily at 12 noon. 30 each 6 11/20/2018 at 1300  . metroNIDAZOLE (FLAGYL)  500 MG tablet Take 1 tablet (500 mg total) by mouth 2 (two) times daily. (Patient not taking: Reported on 09/19/2018) 14 tablet 0   . sulfamethoxazole-trimethoprim (BACTRIM DS) 800-160 MG tablet Take 1 tablet by mouth 2 (two) times daily. (Patient not taking: Reported on 09/19/2018) 14 tablet 0     Review of Systems  Constitutional: Negative.   HENT: Negative.   Eyes: Negative.   Respiratory: Negative.   Cardiovascular: Negative.   Gastrointestinal: Negative.   Endocrine: Negative.   Genitourinary: Positive for pelvic pain (regular UC's every 4 mins since 1400 today).  Musculoskeletal: Negative.   Skin: Negative.   Allergic/Immunologic: Negative.   Neurological: Negative.   Hematological: Negative.   Psychiatric/Behavioral: Negative.    Physical Exam   Blood pressure 113/65, pulse 95, temperature 98.4 F (36.9 C), temperature source Oral, resp. rate 16, height 5' 3" (1.6 m), weight 56.4 kg, last menstrual period 04/23/2018, SpO2 100 %.  Physical Exam  Nursing note and vitals reviewed. Constitutional: She is oriented to person, place, and time. She appears well-developed and well-nourished.  HENT:  Head: Normocephalic and atraumatic.  Eyes: Pupils are equal, round, and reactive to light.  Neck: Normal range of motion.  Cardiovascular: Normal rate.  Respiratory: Effort normal.  GI: Soft.    Genitourinary:    Genitourinary Comments: Dilation: 3 Effacement (%): 70 Cervical Position: Middle Station: -1 Presentation: Vertex Exam by: Rollita Dawson CNM    Musculoskeletal: Normal range of motion.  Neurological: She is alert and oriented to person, place, and time.  Skin: Skin is warm and dry.  Psychiatric: She has a normal mood and affect. Her behavior is normal. Judgment and thought content normal.   Cervical recheck @ 2114: unchanged Cervical recheck @ 2213: unchanged  MAU Course  Procedures  MDM CCUA UCx --pending CEFM Procardia 10 mg every 20 mins x 2  doses Terbutaline 0.25 mg SQ  *Consult with Dr. Eure @ 2120 - notified of patient's complaints, assessments, lab & NST results, recommended tx plan observe x 1 hour and recheck cervix; if no cervical change, d/c home with Rx for Procardia 10 mg TID; return to MAU in 24 hrs for 2nd BMZ injection   Results for orders placed or performed during the hospital encounter of 11/20/18 (from the past 24 hour(s))  Urinalysis, Routine w reflex microscopic     Status: Abnormal   Collection Time: 11/20/18  6:56 PM  Result Value Ref Range   Color, Urine YELLOW YELLOW   APPearance CLEAR CLEAR   Specific Gravity, Urine 1.003 (L) 1.005 - 1.030   pH 7.0 5.0 - 8.0   Glucose, UA NEGATIVE NEGATIVE mg/dL   Hgb urine dipstick NEGATIVE NEGATIVE   Bilirubin Urine NEGATIVE NEGATIVE   Ketones, ur NEGATIVE NEGATIVE mg/dL   Protein, ur NEGATIVE NEGATIVE mg/dL   Nitrite NEGATIVE NEGATIVE   Leukocytes,Ua LARGE (A) NEGATIVE   RBC / HPF 0-5 0 - 5 RBC/hpf   WBC, UA 21-50 0 - 5 WBC/hpf   Bacteria, UA MANY (A) NONE SEEN   Squamous Epithelial / LPF 0-5 0 - 5   WBC Clumps PRESENT     Assessment and Plan  Preterm uterine contractions in third trimester, antepartum  - Rx Procardia 10 mg TID - Information provided on PTL - Return to MAU on 11/21/2018 @ 6:00 PM for 2nd BMZ injection - Advised to return to MAU for >/=6 contractions in 1 hour  History of preterm delivery, currently pregnant - Continue 17-P injections - Needs appt ASAP  msg sent to [facility] to get pt scheduled - Discharge patient - Patient verbalized an understanding of the plan of care and agrees.   Rolitta Dawson, MSN, CNM 11/20/2018, 7:38 PM  

## 2018-11-20 NOTE — Progress Notes (Signed)
This note also relates to the following rows which could not be included: SpO2 - Cannot attach notes to unvalidated device data  Pt sitting straight up cardio fell

## 2018-11-21 ENCOUNTER — Inpatient Hospital Stay (HOSPITAL_COMMUNITY)
Admission: AD | Admit: 2018-11-21 | Discharge: 2018-11-21 | Disposition: A | Discharge status: Home / Self Care | Payer: Medicaid Other | Attending: Family Medicine | Admitting: Family Medicine

## 2018-11-21 DIAGNOSIS — O09899 Supervision of other high risk pregnancies, unspecified trimester: Secondary | ICD-10-CM

## 2018-11-21 DIAGNOSIS — Z3A35 35 weeks gestation of pregnancy: Secondary | ICD-10-CM | POA: Diagnosis not present

## 2018-11-21 DIAGNOSIS — O09213 Supervision of pregnancy with history of pre-term labor, third trimester: Secondary | ICD-10-CM | POA: Insufficient documentation

## 2018-11-21 DIAGNOSIS — O0990 Supervision of high risk pregnancy, unspecified, unspecified trimester: Secondary | ICD-10-CM

## 2018-11-21 MED ORDER — BETAMETHASONE SOD PHOS & ACET 6 (3-3) MG/ML IJ SUSP
12.0000 mg | Freq: Once | INTRAMUSCULAR | Status: AC
  Administered 2018-11-21: 12 mg via INTRAMUSCULAR
  Filled 2018-11-21: qty 2

## 2018-11-21 NOTE — MAU Note (Signed)
Pt here for second betamethasone. Stated she is still having mild ctx about 10 min apart. Taking procardia.

## 2018-11-21 NOTE — MAU Note (Signed)
Pharmacy called regarding order for Betamethasone.

## 2018-11-23 LAB — CULTURE, OB URINE: Culture: >=100000 — AB

## 2018-11-24 ENCOUNTER — Telehealth: Payer: Self-pay | Admitting: Student

## 2018-11-24 ENCOUNTER — Encounter: Payer: Self-pay | Admitting: Student

## 2018-11-24 DIAGNOSIS — O2343 Unspecified infection of urinary tract in pregnancy, third trimester: Secondary | ICD-10-CM | POA: Insufficient documentation

## 2018-11-24 NOTE — Telephone Encounter (Signed)
Left voicemail. Pt needs to be treated for UTI.  Jorje Guild, NP

## 2018-11-26 ENCOUNTER — Telehealth: Payer: Self-pay | Admitting: Student

## 2018-11-26 DIAGNOSIS — O2343 Unspecified infection of urinary tract in pregnancy, third trimester: Secondary | ICD-10-CM

## 2018-11-26 MED ORDER — CEPHALEXIN 500 MG PO CAPS
500.0000 mg | ORAL_CAPSULE | Freq: Four times a day (QID) | ORAL | 0 refills | Status: DC
Start: 2018-11-26 — End: 2018-12-08

## 2018-11-26 NOTE — Telephone Encounter (Signed)
Patient called and verified her identity via birth date and last 38 of her SSN.  Patient agreeable to results via phone and was informed of positive urine culture. Verified allergies. Will send keflex to her pharmacy. Discussed reasons to seek further treatment. Pt had no further questions.     Jorje Guild, NP

## 2018-11-28 ENCOUNTER — Telehealth: Payer: Self-pay | Admitting: Obstetrics and Gynecology

## 2018-11-28 NOTE — Telephone Encounter (Signed)
Spoke to patient about her appointment on 9/23 @ 10:35. Patient instructed to wear a face mask for the entire appointment and no visitors are allowed with her during the visit. Patient screened for covid symptoms and denied having any

## 2018-11-29 ENCOUNTER — Other Ambulatory Visit: Payer: Self-pay | Admitting: Obstetrics and Gynecology

## 2018-11-29 ENCOUNTER — Other Ambulatory Visit: Payer: Self-pay

## 2018-11-29 ENCOUNTER — Ambulatory Visit (INDEPENDENT_AMBULATORY_CARE_PROVIDER_SITE_OTHER): Payer: Medicaid Other | Admitting: Obstetrics and Gynecology

## 2018-11-29 ENCOUNTER — Other Ambulatory Visit (HOSPITAL_COMMUNITY)
Admission: RE | Admit: 2018-11-29 | Discharge: 2018-11-29 | Disposition: A | Discharge status: Home / Self Care | Payer: Medicaid Other | Source: Ambulatory Visit | Attending: Obstetrics and Gynecology | Admitting: Obstetrics and Gynecology

## 2018-11-29 VITALS — BP 106/67 | HR 96 | Temp 97.9°F | Wt 124.3 lb

## 2018-11-29 DIAGNOSIS — O99013 Anemia complicating pregnancy, third trimester: Secondary | ICD-10-CM | POA: Diagnosis not present

## 2018-11-29 DIAGNOSIS — O0933 Supervision of pregnancy with insufficient antenatal care, third trimester: Secondary | ICD-10-CM | POA: Diagnosis not present

## 2018-11-29 DIAGNOSIS — O09213 Supervision of pregnancy with history of pre-term labor, third trimester: Secondary | ICD-10-CM

## 2018-11-29 DIAGNOSIS — D649 Anemia, unspecified: Secondary | ICD-10-CM

## 2018-11-29 DIAGNOSIS — O2343 Unspecified infection of urinary tract in pregnancy, third trimester: Secondary | ICD-10-CM

## 2018-11-29 DIAGNOSIS — O09899 Supervision of other high risk pregnancies, unspecified trimester: Secondary | ICD-10-CM

## 2018-11-29 DIAGNOSIS — O26843 Uterine size-date discrepancy, third trimester: Secondary | ICD-10-CM

## 2018-11-29 DIAGNOSIS — O0990 Supervision of high risk pregnancy, unspecified, unspecified trimester: Secondary | ICD-10-CM

## 2018-11-29 DIAGNOSIS — Z3A36 36 weeks gestation of pregnancy: Secondary | ICD-10-CM

## 2018-11-29 DIAGNOSIS — O0993 Supervision of high risk pregnancy, unspecified, third trimester: Secondary | ICD-10-CM | POA: Diagnosis not present

## 2018-11-29 NOTE — Progress Notes (Signed)
Prenatal Visit Note Date: 11/29/2018 Clinic: [facility]  Subjective:  [name] is a 28 y.o. [id number] at [redacted]w[redacted]d being seen today for ongoing prenatal care.  She is currently monitored for the following issues for this high-risk pregnancy and has Anemia; Supervision of high risk pregnancy, antepartum; History of preterm delivery, currently pregnant; Preterm uterine contractions in third trimester, antepartum; UTI (urinary tract infection) during pregnancy, third trimester; and Insufficient prenatal care in third trimester on their problem list.  Patient reports no complaints.   Contractions: Irritability. Vag. Bleeding: None.  Movement: Present. Denies leaking of fluid.   The following portions of the patient's history were reviewed and updated as appropriate: allergies, current medications, past family history, past medical history, past social history, past surgical history and problem list. Problem list updated.  Objective:   Vitals:   11/29/18 1059  BP: 106/67  Pulse: 96  Temp: 97.9 F (36.6 C)  Weight: 124 lb 4.8 oz (56.4 kg)    Fetal Status: Fetal Heart Rate (bpm): 129 Fundal Height: 33 cm Movement: Present  Presentation: Vertex  General:  Alert, oriented and cooperative. Patient is in no acute distress.  Skin: Skin is warm and dry. No rash noted.   Cardiovascular: Normal heart rate noted  Respiratory: Normal respiratory effort, no problems with respiration noted  Abdomen: Soft, gravid, appropriate for gestational age. Pain/Pressure: Present     Pelvic:  Cervical exam deferred        Extremities: Normal range of motion.  Edema: Trace  Mental Status: Normal mood and affect. Normal behavior. Normal judgment and thought content.   Urinalysis:      Assessment and Plan:  Pregnancy: [id number] at [redacted]w[redacted]d  1. Supervision of high risk pregnancy, antepartum Routine care.  BTL papers signed today. D/w her that if delivers before 30 days that can't tie and  have to do at later date - GC/Chlamydia probe amp ([place])not at [facility] - Culture, beta strep (group b only) - [place] MFM OB FOLLOW UP; Future  2. UTI (urinary tract infection) during pregnancy, third trimester toc one month  3. History of preterm delivery, currently pregnant Took last 17p dose yesterday at home  4. Insufficient prenatal care in third trimester   6. Fundal height low for dates in third trimester Borderline. fkc precautions given. Rpt growth ordered  Preterm labor symptoms and general obstetric precautions including but not limited to vaginal bleeding, contractions, leaking of fluid and fetal movement were reviewed in detail with the patient. Please refer to After Visit Summary for other counseling recommendations.  Return in about 1 week (around 12/06/2018).   Aletha Halim, MD

## 2018-11-30 ENCOUNTER — Inpatient Hospital Stay (HOSPITAL_COMMUNITY)
Admission: AD | Admit: 2018-11-30 | Discharge: 2018-11-30 | Disposition: A | Discharge status: Home / Self Care | Payer: Medicaid Other | Attending: Obstetrics & Gynecology | Admitting: Obstetrics & Gynecology

## 2018-11-30 ENCOUNTER — Encounter (HOSPITAL_COMMUNITY): Payer: Self-pay

## 2018-11-30 DIAGNOSIS — O479 False labor, unspecified: Secondary | ICD-10-CM

## 2018-11-30 DIAGNOSIS — Z3A36 36 weeks gestation of pregnancy: Secondary | ICD-10-CM | POA: Diagnosis not present

## 2018-11-30 DIAGNOSIS — O4703 False labor before 37 completed weeks of gestation, third trimester: Secondary | ICD-10-CM | POA: Insufficient documentation

## 2018-11-30 LAB — CERVICOVAGINAL ANCILLARY ONLY
Chlamydia: NEGATIVE
Neisseria Gonorrhea: NEGATIVE

## 2018-11-30 NOTE — Discharge Instructions (Signed)
Braxton Hicks Contractions Contractions of the uterus can occur throughout pregnancy, but they are not always a sign that you are in labor. You may have practice contractions called Braxton Hicks contractions. These false labor contractions are sometimes confused with true labor. What are Braxton Hicks contractions? Braxton Hicks contractions are tightening movements that occur in the muscles of the uterus before labor. Unlike true labor contractions, these contractions do not result in opening (dilation) and thinning of the cervix. Toward the end of pregnancy (32-34 weeks), Braxton Hicks contractions can happen more often and may become stronger. These contractions are sometimes difficult to tell apart from true labor because they can be very uncomfortable. You should not feel embarrassed if you go to the hospital with false labor. Sometimes, the only way to tell if you are in true labor is for your health care provider to look for changes in the cervix. The health care provider will do a physical exam and may monitor your contractions. If you are not in true labor, the exam should show that your cervix is not dilating and your water has not broken. If there are no other health problems associated with your pregnancy, it is completely safe for you to be sent home with false labor. You may continue to have Braxton Hicks contractions until you go into true labor. How to tell the difference between true labor and false labor True labor  Contractions last 30-70 seconds.  Contractions become very regular.  Discomfort is usually felt in the top of the uterus, and it spreads to the lower abdomen and low back.  Contractions do not go away with walking.  Contractions usually become more intense and increase in frequency.  The cervix dilates and gets thinner. False labor  Contractions are usually shorter and not as strong as true labor contractions.  Contractions are usually irregular.  Contractions  are often felt in the front of the lower abdomen and in the groin.  Contractions may go away when you walk around or change positions while lying down.  Contractions get weaker and are shorter-lasting as time goes on.  The cervix usually does not dilate or become thin. Follow these instructions at home:   Take over-the-counter and prescription medicines only as told by your health care provider.  Keep up with your usual exercises and follow other instructions from your health care provider.  Eat and drink lightly if you think you are going into labor.  If Braxton Hicks contractions are making you uncomfortable: ? Change your position from lying down or resting to walking, or change from walking to resting. ? Sit and rest in a tub of warm water. ? Drink enough fluid to keep your urine pale yellow. Dehydration may cause these contractions. ? Do slow and deep breathing several times an hour.  Keep all follow-up prenatal visits as told by your health care provider. This is important. Contact a health care provider if:  You have a fever.  You have continuous pain in your abdomen. Get help right away if:  Your contractions become stronger, more regular, and closer together.  You have fluid leaking or gushing from your vagina.  You pass blood-tinged mucus (bloody show).  You have bleeding from your vagina.  You have low back pain that you never had before.  You feel your baby's head pushing down and causing pelvic pressure.  Your baby is not moving inside you as much as it used to. Summary  Contractions that occur before labor are   called Braxton Hicks contractions, false labor, or practice contractions.  Braxton Hicks contractions are usually shorter, weaker, farther apart, and less regular than true labor contractions. True labor contractions usually become progressively stronger and regular, and they become more frequent.  Manage discomfort from Braxton Hicks contractions  by changing position, resting in a warm bath, drinking plenty of water, or practicing deep breathing. This information is not intended to replace advice given to you by your health care provider. Make sure you discuss any questions you have with your health care provider. Document Released: 07/08/2016 Document Revised: 02/04/2017 Document Reviewed: 07/08/2016 Elsevier Patient Education  2020 Elsevier Inc[employer]  

## 2018-11-30 NOTE — MAU Provider Note (Signed)
S: [name] is a 28 y.o. [id number] at [redacted]w[redacted]d  who presents to MAU today for labor evaluation.     Cervical exam by RN:  Dilation: 3 Effacement (%): 70 Cervical Position: Posterior Station: -2 Presentation: Vertex Exam by:: Esau Grew, RN- unchanged after 1 hr  Fetal Monitoring: Baseline: 130s Variability: avg ltv Accelerations: pos Decelerations: neg, but occ variables Contractions: irreg 2-6 mins  MDM Discussed patient with RN. NST reviewed.   A: [facility]UP at [redacted]w[redacted]d  False labor  P: Discharge home Labor precautions and kick counts included in AVS Patient to [facility] as scheduled  Patient may return to MAU as needed or when in labor   Myrtis Ser, CNM 11/30/2018 5:30 AM

## 2018-11-30 NOTE — MAU Note (Signed)
CTX 4 minutes apart.  No LOF/VB.  + FM.  3 cm on last exam.

## 2018-12-03 LAB — CULTURE, BETA STREP (GROUP B ONLY): Strep Gp B Culture: NEGATIVE

## 2018-12-05 ENCOUNTER — Telehealth: Payer: Self-pay | Admitting: Family Medicine

## 2018-12-05 NOTE — Telephone Encounter (Signed)
Attempted to call patient about her appointment on 9/30 @ 9:15. No answer left voicemail instructing patient to wear a face mask for the entire appointment and no visitors are allowed during the visit. Patient instructed not to attend the appointment if she was any symptoms. Symptom list and office number left.

## 2018-12-06 ENCOUNTER — Other Ambulatory Visit: Payer: Self-pay

## 2018-12-06 ENCOUNTER — Encounter (HOSPITAL_COMMUNITY): Payer: Self-pay | Admitting: *Deleted

## 2018-12-06 ENCOUNTER — Inpatient Hospital Stay (HOSPITAL_COMMUNITY)
Admission: AD | Admit: 2018-12-06 | Discharge: 2018-12-08 | DRG: 807 | Disposition: A | Discharge status: Home / Self Care | Payer: Medicaid Other | Attending: Family Medicine | Admitting: Family Medicine

## 2018-12-06 ENCOUNTER — Encounter: Payer: Medicaid Other | Admitting: Family Medicine

## 2018-12-06 ENCOUNTER — Ambulatory Visit (HOSPITAL_COMMUNITY): Payer: Medicaid Other

## 2018-12-06 DIAGNOSIS — O26893 Other specified pregnancy related conditions, third trimester: Secondary | ICD-10-CM | POA: Diagnosis present

## 2018-12-06 DIAGNOSIS — Z23 Encounter for immunization: Secondary | ICD-10-CM | POA: Diagnosis not present

## 2018-12-06 DIAGNOSIS — Z3A37 37 weeks gestation of pregnancy: Secondary | ICD-10-CM

## 2018-12-06 DIAGNOSIS — Z20828 Contact with and (suspected) exposure to other viral communicable diseases: Secondary | ICD-10-CM | POA: Diagnosis present

## 2018-12-06 LAB — TYPE AND SCREEN
ABO/RH(D): AB POS
Antibody Screen: NEGATIVE

## 2018-12-06 LAB — CBC
HCT: 29.4 % — ABNORMAL LOW (ref 36.0–46.0)
Hemoglobin: 9.1 g/dL — ABNORMAL LOW (ref 12.0–15.0)
MCH: 25.2 pg — ABNORMAL LOW (ref 26.0–34.0)
MCHC: 31 g/dL (ref 30.0–36.0)
MCV: 81.4 fL (ref 80.0–100.0)
Platelets: 290 10*3/uL (ref 150–400)
RBC: 3.61 MIL/uL — ABNORMAL LOW (ref 3.87–5.11)
RDW: 17.4 % — ABNORMAL HIGH (ref 11.5–15.5)
WBC: 12.5 10*3/uL — ABNORMAL HIGH (ref 4.0–10.5)
nRBC: 0 % (ref 0.0–0.2)

## 2018-12-06 LAB — ABO/RH: ABO/RH(D): AB POS

## 2018-12-06 LAB — SARS CORONAVIRUS 2 BY RT PCR (HOSPITAL ORDER, PERFORMED IN ~~LOC~~ HOSPITAL LAB): SARS Coronavirus 2: NEGATIVE

## 2018-12-06 MED ORDER — ONDANSETRON HCL 4 MG/2ML IJ SOLN: 4.0000 mg | Freq: Four times a day (QID) | INTRAMUSCULAR | Status: DC | PRN

## 2018-12-06 MED ORDER — LIDOCAINE HCL (PF) 1 % IJ SOLN
30.0000 mL | INTRAMUSCULAR | Status: DC | PRN
  Filled 2018-12-06: qty 30

## 2018-12-06 MED ORDER — WITCH HAZEL-GLYCERIN EX PADS: 1.0000 "application " | MEDICATED_PAD | CUTANEOUS | Status: DC | PRN

## 2018-12-06 MED ORDER — OXYTOCIN 40 UNITS IN NORMAL SALINE INFUSION - SIMPLE MED
2.5000 [IU]/h | INTRAVENOUS | Status: DC
  Filled 2018-12-06: qty 1000

## 2018-12-06 MED ORDER — OXYCODONE-ACETAMINOPHEN 5-325 MG PO TABS: 1.0000 | ORAL_TABLET | ORAL | Status: DC | PRN

## 2018-12-06 MED ORDER — LACTATED RINGERS IV SOLN
INTRAVENOUS | Status: DC
  Administered 2018-12-06: 04:00:00 via INTRAVENOUS

## 2018-12-06 MED ORDER — FENTANYL CITRATE (PF) 100 MCG/2ML IJ SOLN
50.0000 ug | Freq: Once | INTRAMUSCULAR | Status: AC
  Administered 2018-12-06: 50 ug via INTRAVENOUS
  Filled 2018-12-06: qty 2

## 2018-12-06 MED ORDER — DIPHENHYDRAMINE HCL 25 MG PO CAPS: 25.0000 mg | ORAL_CAPSULE | Freq: Four times a day (QID) | ORAL | Status: DC | PRN

## 2018-12-06 MED ORDER — ACETAMINOPHEN 325 MG PO TABS
650.0000 mg | ORAL_TABLET | ORAL | Status: DC | PRN
  Administered 2018-12-06: 650 mg via ORAL
  Filled 2018-12-06: qty 2

## 2018-12-06 MED ORDER — PRENATAL MULTIVITAMIN CH
1.0000 | ORAL_TABLET | Freq: Every day | ORAL | Status: DC
  Administered 2018-12-06 – 2018-12-07 (×2): 1 via ORAL
  Filled 2018-12-06 (×2): qty 1

## 2018-12-06 MED ORDER — COCONUT OIL OIL: 1.0000 "application " | TOPICAL_OIL | Status: DC | PRN

## 2018-12-06 MED ORDER — FLEET ENEMA 7-19 GM/118ML RE ENEM: 1.0000 | ENEMA | RECTAL | Status: DC | PRN

## 2018-12-06 MED ORDER — OXYCODONE HCL 5 MG PO TABS: 5.0000 mg | ORAL_TABLET | ORAL | Status: DC | PRN

## 2018-12-06 MED ORDER — OXYCODONE-ACETAMINOPHEN 5-325 MG PO TABS: 2.0000 | ORAL_TABLET | ORAL | Status: DC | PRN

## 2018-12-06 MED ORDER — ZOLPIDEM TARTRATE 5 MG PO TABS: 5.0000 mg | ORAL_TABLET | Freq: Every evening | ORAL | Status: DC | PRN

## 2018-12-06 MED ORDER — IBUPROFEN 600 MG PO TABS
600.0000 mg | ORAL_TABLET | Freq: Four times a day (QID) | ORAL | Status: DC
  Administered 2018-12-06 – 2018-12-08 (×8): 600 mg via ORAL
  Filled 2018-12-06 (×8): qty 1

## 2018-12-06 MED ORDER — BENZOCAINE-MENTHOL 20-0.5 % EX AERO
1.0000 "application " | INHALATION_SPRAY | CUTANEOUS | Status: DC | PRN
  Administered 2018-12-06: 1 via TOPICAL
  Filled 2018-12-06: qty 56

## 2018-12-06 MED ORDER — SIMETHICONE 80 MG PO CHEW: 80.0000 mg | CHEWABLE_TABLET | ORAL | Status: DC | PRN

## 2018-12-06 MED ORDER — SOD CITRATE-CITRIC ACID 500-334 MG/5ML PO SOLN: 30.0000 mL | ORAL | Status: DC | PRN

## 2018-12-06 MED ORDER — DIBUCAINE (PERIANAL) 1 % EX OINT: 1.0000 "application " | TOPICAL_OINTMENT | CUTANEOUS | Status: DC | PRN

## 2018-12-06 MED ORDER — OXYTOCIN BOLUS FROM INFUSION
500.0000 mL | Freq: Once | INTRAVENOUS | Status: AC
  Administered 2018-12-06: 06:00:00 500 mL via INTRAVENOUS

## 2018-12-06 MED ORDER — ACETAMINOPHEN 325 MG PO TABS
650.0000 mg | ORAL_TABLET | ORAL | Status: DC | PRN
  Administered 2018-12-06 – 2018-12-07 (×2): 650 mg via ORAL
  Filled 2018-12-06 (×2): qty 2

## 2018-12-06 MED ORDER — INFLUENZA VAC SPLIT QUAD 0.5 ML IM SUSY
0.5000 mL | PREFILLED_SYRINGE | INTRAMUSCULAR | Status: AC
  Administered 2018-12-07: 10:00:00 0.5 mL via INTRAMUSCULAR
  Filled 2018-12-06: qty 0.5

## 2018-12-06 MED ORDER — LACTATED RINGERS IV SOLN: 500.0000 mL | INTRAVENOUS | Status: DC | PRN

## 2018-12-06 MED ORDER — ONDANSETRON HCL 4 MG/2ML IJ SOLN: 4.0000 mg | INTRAMUSCULAR | Status: DC | PRN

## 2018-12-06 MED ORDER — ONDANSETRON HCL 4 MG PO TABS: 4.0000 mg | ORAL_TABLET | ORAL | Status: DC | PRN

## 2018-12-06 MED ORDER — SENNOSIDES-DOCUSATE SODIUM 8.6-50 MG PO TABS
2.0000 | ORAL_TABLET | ORAL | Status: DC
  Filled 2018-12-06 (×2): qty 2

## 2018-12-06 NOTE — MAU Note (Signed)
Pt reports to MAU c/o possible LOF that started around 2300 clear fluid. +FM. Pt reports ctx every 2 min lasting 45+ seconds.

## 2018-12-06 NOTE — MAU Provider Note (Signed)
S: Ms. Breanne Olvera is a 28 y.o. [phone] at [redacted]w[redacted]d  who presents to MAU today complaining of leaking of fluid since 11pm. However, she reports sexual intercourse today at 5pm. She denies vaginal bleeding. She endorses contractions. She reports normal fetal movement.    O: BP 104/71   Pulse 95   Temp 98 F (36.7 C) (Oral)   Resp 18   LMP 04/23/2018 (Exact Date)  GENERAL: Well-developed, well-nourished female in no acute distress.  HEAD: Normocephalic, atraumatic.  CHEST: Normal effort of breathing, regular heart rate ABDOMEN: Soft, nontender, gravid PELVIC: Normal external female genitalia. Vagina is pink and rugated. Cervix with normal contour, no lesions. Normal discharge.  Positive pooling. [place].  Cervical exam:  Dilation: 4 Effacement (%): 50 Station: -3 Presentation: Vertex Exam by:: Shaunak Kreis cnm   Fetal Monitoring: FHT: 130 bpm, Mod Var, -Decels, +Accels Toco: Q1-35min  No results found for this or any previous visit (from the past 24 hour(s)).   A: [facility]UP at [redacted]w[redacted]d  SROM  Cat I FT  P: Fern Negative Will monitor and have nurse reassess cervix in 2 hours  Gavin Pound, CNM 12/06/2018 1:54 AM   Reassessment (3:29 AM) Nurse reports patient with pain and c/o rectal pressure. Dilation: 5 Effacement (%): 50 Station: -3 Presentation: Vertex Exam by:: Lauren Cox RN  Instructed to call labor team for admission.   Maryann Conners MSN, CNM Advanced Practice Provider, [facility]

## 2018-12-06 NOTE — Discharge Summary (Signed)
Postpartum Discharge Summary     Patient Name: [name] DOB: 14-Dec-1990 MRN: [id number]  Date of admission: 12/06/2018 Delivering Provider: Chauncey Mann   Date of discharge: 12/08/2018  Admitting diagnosis: 37 wks, ctx Intrauterine pregnancy: [redacted]w[redacted]d    Secondary diagnosis:  Active Problems:   Indication for care in labor or delivery   [redacted] weeks gestation of pregnancy  Additional problems: None     Discharge diagnosis: Term Pregnancy Delivered                                                                                                Post partum procedures:None  Augmentation: AROM  Complications: None  Hospital course:  Onset of Labor With Vaginal Delivery     28y.o. yo [id number] 350w2das admitted in Latent Labor on 12/06/2018. Patient had an uncomplicated labor course as follows. Initial SVE: 5/50/-3. She received AROM at 7 cm and then progressed to complete within minutes.  Membrane Rupture Time/Date: 5:16 AM ,12/06/2018   Intrapartum Procedures: Episiotomy: None [1]                                         Lacerations:  None [1]  Patient had a delivery of a Viable infant. 12/06/2018  Information for the patient's newborn:  Mackenzie, MartinezDelivery Method: Vaginal, Spontaneous(Filed from Delivery Summary)     Pateint had an uncomplicated postpartum course.  She is ambulating, tolerating a regular diet, passing flatus, and urinating well. Patient is discharged home in stable condition on 12/08/18. Patient plans for interval BTL; paperwork signed.  Delivery time: 5:28 AM    Magnesium Sulfate received: No BMZ received: Yes (due to prior admission on 9/14 & 9/15) Rhophylac:No MMR:No Transfusion:No  Physical exam  Vitals:   12/07/18 0622 12/07/18 1825 12/07/18 2138 12/08/18 0625  BP: 104/77 93/62 105/70 (!) 95/58  Pulse: 66 67 80 70  Resp: '18 18 18 16  ' Temp: 98.9 F (37.2 C) 97.9 F (36.6 C) 98.3 F (36.8 C) 98.8 F (37.1 C)   TempSrc: Oral Oral Oral Oral  SpO2: 100%  100% 100%  Weight:      Height:       General: alert, cooperative and no distress Lochia: appropriate Uterine Fundus: firm DVT Evaluation: No evidence of DVT seen on physical exam. Labs: Lab Results  Component Value Date   WBC 12.5 (H) 12/06/2018   HGB 9.1 (L) 12/06/2018   HCT 29.4 (L) 12/06/2018   MCV 81.4 12/06/2018   PLT 290 12/06/2018   CMP Latest Ref Rng & Units 08/24/2016  Glucose 65 - 99 mg/dL 100(H)  BUN 6 - 20 mg/dL 10  Creatinine 0.44 - 1.00 mg/dL 0.73  Sodium 135 - 145 mmol/L 137  Potassium 3.5 - 5.1 mmol/L 3.6  Chloride 101 - 111 mmol/L 106  CO2 22 - 32 mmol/L 23  Calcium 8.9 - 10.3 mg/dL 8.8(L)  Total Protein 6.5 - 8.1 g/dL 7.4  Total Bilirubin 0.3 -  1.2 mg/dL 1.8(H)  Alkaline Phos 38 - 126 U/L 49  AST 15 - 41 U/L 19  ALT 14 - 54 U/L 13(L)    Discharge instruction: per After Visit Summary and "Baby and Me Booklet".  After visit meds:  Allergies as of 12/08/2018      Reactions   Cherry Hives, Itching      Medication List    STOP taking these medications   cephALEXin 500 MG capsule Commonly known as: KEFLEX   NIFEdipine 10 MG capsule Commonly known as: Procardia     TAKE these medications   acetaminophen 325 MG tablet Commonly known as: Tylenol Take 2 tablets (650 mg total) by mouth every 4 (four) hours as needed (for pain scale < 4).   AMBULATORY NON FORMULARY MEDICATION Blood pressure cuff/Monitored regularly at home.  ICD 10: O09.90   ferrous gluconate 324 MG tablet Commonly known as: FERGON Take 1 tablet (324 mg total) by mouth 2 (two) times daily with a meal.   ibuprofen 600 MG tablet Commonly known as: ADVIL Take 1 tablet (600 mg total) by mouth every 6 (six) hours.   prenatal vitamin w/FE, FA 27-1 MG Tabs tablet Take 1 tablet by mouth daily at 12 noon.       Diet: routine diet  Activity: Advance as tolerated. Pelvic rest for 6 weeks.   Outpatient follow up:4 weeks Follow up  Appt: Future Appointments  Date Time Provider [place]  01/03/2019  2:15 PM Nugent, Gerrie Nordmann, NP [facility]   Follow up Visit:   Please schedule this patient for Postpartum visit in: 4 weeks with the following provider: Any provider Low risk pregnancy complicated by: None Delivery mode:  SVD Anticipated Birth Control:  Plans Interval BTL PP Procedures needed: None  Schedule Integrated BH visit: no      Newborn Data: Live born female  Birth Weight: 2560g APGAR: 81, 9  Newborn Delivery   Birth date/time: 12/06/2018 05:28:00 Delivery type: Vaginal, Spontaneous      Baby Feeding: Breast Disposition:home with mother   12/08/2018 Chauncey Mann, MD

## 2018-12-06 NOTE — H&P (Addendum)
OBSTETRIC ADMISSION HISTORY AND PHYSICAL  [name] is a 28 y.o. female [id number] with IUP at 40w2dpresenting for SOL ?SROM at home, ruled out rupture, but contractions q1-4 mins with cervical change so patient admitted.  She reports +FMs. No LOF, VB, blurry vision, headaches, peripheral edema, or RUQ pain. She plans on breast feeding. She requests BTL for birth control,  But papers were signed on 11/29/2018. BMZ '@9' /14 and 9/15.  Dating: By [place]7/14'@26'  weeks --->  Estimated Date of Delivery: 12/25/18  Sono:    '@[redacted]w[redacted]d' , CWD, normal anatomy, cephalic presentation, 13710G 14th%ile, EFW 2650g   Prenatal History/Complications: Hx of PTL x3, 17-P with other pregnancies UTI  [facility] Past Medical History: Past Medical History:  Diagnosis Date  . Anemia    iron   . History of preterm delivery, currently pregnant, unspecified trimester 04/04/2017    [facility]  . No pertinent past medical history   . Preterm delivery   . Stillbirth   . UTI (lower urinary tract infection)   . Vaginal wall cyst 01/02/2015   See photo in note from 01/01/15     Past Surgical History: Past Surgical History:  Procedure Laterality Date  . NO PAST SURGERIES      Obstetrical History: OB History    Gravida  6   Para  4   Term  1   Preterm  3   AB  1   Living  4     SAB  1   TAB  0   Ectopic  0   Multiple  0   Live Births  4           Social History: Social History   Socioeconomic History  . Marital status: Single    Spouse name: Not on file  . Number of children: Not on file  . Years of education: Not on file  . Highest education level: Not on file  Occupational History    [place] [employer] Social Needs  . Financial resource strain: Not on file  . Food insecurity    Worry: Never true    Inability: Never true  . Transportation needs    Medical: No    Non-medical: No  Tobacco Use  . Smoking status: Never Smoker   . Smokeless tobacco: Never Used  Substance and Sexual Activity  . Alcohol use: No  . Drug use: No  . Sexual activity: Yes    Birth control/protection: None    Comment: last sex a few weeks ago  Lifestyle  . Physical activity    Days per week: Not on file    Minutes per session: Not on file  . Stress: Not on file  Relationships  . Social [place] phone: Not on file    Gets together: Not on file    Attends religious service: Not on file    Active member of club or organization: Not on file    Attends meetings of clubs or organizations: Not on file    Relationship status: Not on file  Other Topics Concern  . Not on file  Social History Narrative  . Not on file    Family History: Family History  Problem Relation Age of Onset  . Hypertension Mother     Allergies: Allergies  Allergen Reactions  . Cherry Hives and Itching    Medications Prior to Admission  Medication Sig Dispense Refill Last Dose  .  AMBULATORY NON FORMULARY MEDICATION Blood pressure cuff/Monitored regularly at home.  ICD 10: O09.90 1 kit 0 Past Week at Unknown time  . cephALEXin (KEFLEX) 500 MG capsule Take 1 capsule (500 mg total) by mouth 4 (four) times daily. 28 capsule 0 12/05/2018 at Unknown time  . ferrous gluconate (FERGON) 324 MG tablet Take 1 tablet (324 mg total) by mouth 2 (two) times daily with a meal. 60 tablet 1 12/05/2018 at Unknown time  . NIFEdipine (PROCARDIA) 10 MG capsule Take 1 capsule (10 mg total) by mouth 3 (three) times daily. 90 capsule 0 12/05/2018 at Unknown time  . prenatal vitamin w/FE, FA (PRENATAL 1 + 1) 27-1 MG TABS tablet Take 1 tablet by mouth daily at 12 noon. 30 each 6 12/05/2018 at Unknown time     Review of Systems   All systems reviewed and negative except as stated in HPI  Blood pressure 104/71, pulse 95, temperature 98 F (36.7 C), temperature source Oral, resp. rate 18, last menstrual period 04/23/2018, unknown if currently breastfeeding. General  appearance: alert, cooperative, appears stated age and mild distress Lungs: regular rate and effort Heart: regular rate  Abdomen: soft, non-tender Extremities: Homans sign is negative, no sign of DVT Presentation: cephalic Fetal monitoringBaseline: 130 bpm, Variability: Good {> 6 bpm), Accelerations: Reactive and Decelerations: Absent Uterine activity  q 3-4 mins Dilation: 5 Effacement (%): 50 Station: -3 Exam by:: Patricia Pesa RN    Prenatal labs: ABO, Rh: AB/Positive/-- (06/10 1642) Antibody: Negative (06/10 1642) Rubella: 1.51 (06/10 1642) RPR: Non Reactive (06/10 1642)  HBsAg: Negative (06/10 1642)  HIV: Non Reactive (06/10 1642)  GBS: Negative/-- (09/23 1143)  2 hr GTT not done  Prenatal Transfer Tool  Maternal Diabetes: No, unknown, no screening Genetic Screening: Normal Maternal Ultrasounds/Referrals: Normal Fetal Ultrasounds or other Referrals:  None Maternal Substance Abuse:  No Significant Maternal Medications:  None Significant Maternal Lab Results: Group B Strep negative  No results found for this or any previous visit (from the past 24 hour(s)).  Patient Active Problem List   Diagnosis Date Noted  . Insufficient prenatal care in third trimester 11/29/2018  . UTI (urinary tract infection) during pregnancy, third trimester 11/24/2018  . Preterm uterine contractions in third trimester, antepartum 11/20/2018  . Supervision of high risk pregnancy, antepartum 08/08/2018  . History of preterm delivery, currently pregnant 08/08/2018  . Anemia 07/06/2017    Assessment: [name] is a 28 y.o. [id number] at 55w2dhere for SOL.  1. Labor: Will continue expectant management 2. FWB: Cat 1 3. Pain: prn fentanyl  4. GBS: negative 5. BTL- paper signed on 11/29/2018 so not a candidate for post-partum tubal  6. Plans to breast feed    PNikki DomDriver, MD  98/32/5498 3:26 AM  I saw and evaluated the patient. I agree with the findings and the plan of care as  documented in the resident's note. Will plan for interval BTL, declines birth control in the meantime. Expectant management. EFW 2600g.  CBarrington Ellison MD [facility] FoundationsFamily Medicine Fellow, FBismarck Surgical Associates LLCfor WDean [employer] Company CBarling

## 2018-12-06 NOTE — Progress Notes (Signed)
Labor Progress Note Kynsie Falkner is a 28 y.o. [id number] at [redacted]w[redacted]d presented for SOL. S: Feeling urge to push.   O:  BP 104/69   Pulse 87   Temp 98.4 F (36.9 C) (Oral)   Resp 16   Ht 5\' 3"  (1.6 m)   Wt 56.7 kg   LMP 04/23/2018 (Exact Date)   BMI 22.14 kg/m  EFM: 130, reactive, moderate variability, pos accels, no decels Ctx: q5m  CVE: Dilation: 7.5 Effacement (%): 100 Cervical Position: Middle Station: 0 Presentation: Vertex Exam by:: Kerianne Gurr   A&P: 28 y.o. [id number] [redacted]w[redacted]d here for SOL. #Labor: Progressing well. Bulging bag with exam and patient requested AROM. Clear fluid. Anticipate SVD soon.  #Pain: None per patient choice #FWB: Cat I #GBS negative  Chauncey Mann, MD 5:19 AM

## 2018-12-06 NOTE — Lactation Note (Signed)
This note was copied from a baby's chart. Lactation Consultation Note:  P5, infant is 78 hours old. Infant is 37.2 weeks. Mother has a history of Preterm infants., Mother reports that she pumped with a hand pump for 6 months with each child.  Mother has a hand pump at the bedside. She reports that she only likes to use a hand pump. She declines need for DEBP. Mother reports that she has not started pumping yet, but plans to soon.  LC opened pump and reviewed collection, cleaning and storage. Mother receptive and remembers how to hand express. Mother reports seeing colostrum.  Mother bottle feeding formula at breast.  Mother is active with [place]. Mother reports that she is aware of [place] services at [facility].   Patient Name: [name] Today's Date: 12/06/2018 Reason for consult: Initial assessment   Maternal Data Has patient been taught Hand Expression?: Yes Does the patient have breastfeeding experience prior to this delivery?: Yes  Feeding Feeding Type: Formula  LATCH Score                   Interventions Interventions: Breast feeding basics reviewed;Hand express;Hand pump  Lactation Tools Discussed/Used WIC Program: Yes Pump Review: Setup, frequency, and cleaning;Milk Storage Initiated by:: Staff Nurse Date initiated:: 12/06/18   Consult Status Consult Status: Follow-up Date: 12/07/18 Follow-up type: In-patient    Jess Barters [facility] 12/06/2018, 2:50 PM

## 2018-12-06 NOTE — Lactation Note (Signed)
This note was copied from a baby's chart. Lactation Consultation Note  Patient Name: [name] WIOMB'T Date: 12/06/2018 Reason for consult: Follow-up assessment   Mom has bf her other children all for 6 months.  She has a 9 6 3  and 26 yr old at home.  She has a DEBP, Medela at home.  She has 3 bags of frozen breastmilk she hand exp. And pumped prior to delivering.  3, 4, and 6 oz bags.  Dad was going to go home to get on of the bags of milk.  LC reviewed guidelines for using breastmilk after completely thawing, (24 hours).  Dad will place milk in refrigerator.  Mom desires to have DEBP set up.  LC reviewed frequency, collection, and storage of ebm.  Mom was encouraged to pump at least 8 times in 24 hours.    Mom denies further questions at this time.   Maternal Data Has patient been taught Hand Expression?: Yes Does the patient have breastfeeding experience prior to this delivery?: Yes  Feeding    LATCH Score                   Interventions Interventions: Expressed milk;DEBP  Lactation Tools Discussed/Used WIC Program: Yes Pump Review: Setup, frequency, and cleaning;Milk Storage Initiated by:: Staff Nurse Date initiated:: 12/06/18   Consult Status Consult Status: Follow-up Date: 12/07/18 Follow-up type: In-patient    Ferne Coe [facility] 12/06/2018, 4:52 PM

## 2018-12-06 NOTE — Progress Notes (Signed)
Patient educated on falls and baby safety. Plan of care told.

## 2018-12-07 ENCOUNTER — Encounter (HOSPITAL_COMMUNITY): Payer: Self-pay | Admitting: *Deleted

## 2018-12-07 LAB — BIRTH TISSUE RECOVERY COLLECTION (PLACENTA DONATION)

## 2018-12-07 LAB — RPR: RPR Ser Ql: NONREACTIVE

## 2018-12-07 MED ORDER — FERROUS SULFATE 325 (65 FE) MG PO TABS
325.0000 mg | ORAL_TABLET | Freq: Every day | ORAL | Status: DC
  Administered 2018-12-07: 325 mg via ORAL
  Filled 2018-12-07: qty 1

## 2018-12-07 NOTE — Lactation Note (Signed)
This note was copied from a baby's chart. Lactation Consultation Note  Patient Name: [name] Date: 12/07/2018 Reason for consult: Follow-up assessment;Early term 37-38.6wks;Infant < 6lbs  1841 - 1851 - I followed up with [name]. She states that she is exclusively pumping and bottle feeding her EBM as well as formula by preference. She is using a manual pump, which she also prefers over a DEBP. I noted a bullet halfway filled with colostrum. However, [name] states that her milk is transitioning and that she is able to now pump 4 ounces of breast milk. She requested some additional milk storage containers, which I provided.  I noted that [name] did not have the proper equipment for cleaning her breast pump, and she states that she has been washing her pump parts in the sink. I provided her with soap, scrub brush and bin and recommended she clean her equipment in the bin.  I also recommended that if she is able to pump 4 ounces that she prioritize feeding baby with her breast milk. She states that she is pumping every 2-3 hours.   PLAN:  Feed baby 8-12 times a day on demand and wake to feed as needed. I recommended not allowing baby to go longer than 3 hours between feedings.    Maternal Data Formula Feeding for Exclusion: No  Feeding Feeding Type: Bottle Fed - Formula   Interventions Interventions: Breast feeding basics reviewed  Lactation Tools Discussed/Used Pump Review: Setup, frequency, and cleaning;Milk Storage   Consult Status Consult Status: Follow-up Date: 12/08/18 Follow-up type: In-patient    [name] 12/07/2018, 7:00 PM

## 2018-12-07 NOTE — Clinical Social Work Maternal (Signed)
CLINICAL SOCIAL WORK MATERNAL/CHILD NOTE  Patient Details  Name: [name] MRN: [id number] Date of Birth: 12/11/1990  Date:  12/07/2018  Clinical Social Worker Initiating Note:  Dezyre Hoefer, LCSW Date/Time: Initiated:  12/07/18/0915     Child's Name:  Za'liyah [name]   Biological Parents:  Mother, Father   Need for Interpreter:  None   Reason for Referral:  Late or No Prenatal Care    Address:  [address] [place] [address]    Phone number:  [phone] (home)     Additional phone number: none   Household Members/Support Persons (HM/SP):   Household Member/Support Person 3, Household Member/Support Person 1   HM/SP Name Relationship DOB or Age  HM/SP -1 [name] MOB  02/16/1993  HM/SP -2   [name]  FOB   04/09/1990  HM/SP -3 [name] Son  09/11/2013  HM/SP -4   [name]  daughter   02/26/2015  HM/SP -5   [name]  daughter   07/21/2017  HM/SP -6   [name]  daughter   11/24/2012  HM/SP -7        HM/SP -8          Natural Supports (not living in the home):  Parent   Professional Supports: None   Employment: Unemployed   Type of Work:  none    Education:  College graduate   Homebound arranged:    Financial Resources:  Medicaid   Other Resources:  Food Stamps , WIC   Cultural/Religious Considerations Which May Impact Care:  none reported   Strengths:  Home prepared for child , Pediatrician chosen, Compliance with medical plan , Ability to meet basic needs    Psychotropic Medications:   none       Pediatrician:    [place] area  Pediatrician List:   [place] [facility] ([address])  [place]    [place]    [place]     [place]    [place]      Pediatrician Fax Number:    Risk Factors/Current Problems:  None   Cognitive State:  Alert , Able to Concentrate , Insightful    Mood/Affect:  Calm , Comfortable , Relaxed  , Interested    CSW Assessment: CSW consulted as MOB only had 2 PNC visits while pregnant. CSW went to speak with MOB at bedside to address further needs.   CSW congratulated MOB on the birth of infant. MOB was advised of the reason for CSW coming to visit with her. MOB reported that she went to her first OB appointment and then they never scheduled her another appointment due to COVID. CSW was understanding but also advised MOB of the hospital drug screen policy. MOB was advised that there are two forms of drug screens that are performed on infant, one being a urine and the other being a cord. MOB was informed that if either are positive then a CPS report would need to made. MOB understanding and denied ever having any CPS history and denies using any substances while pregnant.   CSW inquired from MOB on her mental health history. MOB reported that she doesn't have any mental health history. MOB expressed that she has been feeling fine since giving birth and denies SI or HI. MOB informed CSW that she has all needed items to care for infant at this time. MOB was given information on PPD and SIDS. MOB was also given PPD   Checklist in order to keep track of feelings as they relate to PPD.   CSW will continue to monitor infants UDS and Cord for CPS report if warranted.   CSW Plan/Description:  No Further Intervention Required/No Barriers to Discharge, Sudden Infant Death Syndrome (SIDS) Education, Perinatal Mood and Anxiety Disorder (PMADs) Education, CSW Will Continue to Monitor Umbilical Cord Tissue Drug Screen Results and Make Report if Warranted, Hospital Drug Screen Policy Information    Arly Salminen S Eiman Maret, LCSWA 12/07/2018, 11:06 AM 

## 2018-12-07 NOTE — Progress Notes (Signed)
Post Partum Day 1 Subjective: Reports feeling well. Breastfeeding. Minimal lochia. Pain well-controlled. Tolerating a regular diet.   Objective: Blood pressure 103/71, pulse 73, temperature 98.4 F (36.9 C), temperature source Oral, resp. rate 18, height 5\' 3"  (1.6 m), weight 56.7 kg, last menstrual period 04/23/2018, SpO2 100 %, unknown if currently breastfeeding.  Physical Exam:  General: alert, cooperative, appears stated age and no distress Lochia: appropriate Uterine Fundus: firm DVT Evaluation: No evidence of DVT seen on physical exam.  Recent Labs    12/06/18 0344  HGB 9.1*  HCT 29.4*    Assessment/Plan: Plan for discharge tomorrow; offered DC today but patient would like to stay until PPD#2 Vitals stable Plan for interval BTL Breastfeeding Will start ferrous sulfate   LOS: 1 day   [name] 12/07/2018, 6:13 AM

## 2018-12-08 ENCOUNTER — Encounter (HOSPITAL_COMMUNITY): Payer: Self-pay | Admitting: *Deleted

## 2018-12-08 MED ORDER — ACETAMINOPHEN 325 MG PO TABS: 650.0000 mg | ORAL_TABLET | ORAL | 0 refills | Status: DC | PRN

## 2018-12-08 MED ORDER — IBUPROFEN 600 MG PO TABS: 600.0000 mg | ORAL_TABLET | Freq: Four times a day (QID) | ORAL | 0 refills | Status: DC

## 2018-12-08 NOTE — Lactation Note (Signed)
This note was copied from a baby's chart. Lactation Consultation Note  Patient Name: Girl Maritsa Hunsucker YMEBR'A Date: 12/08/2018 Reason for consult: Follow-up assessment   Baby 38 hours old and mother is pumping and feeding formula. Encouraged her to pump q 3 hours. Feed on demand with cues.  Goal 8-12+ times per day after first 24 hrs.  Place baby STS if not cueing.  Reviewed engorgement care and monitoring voids/stools. Offered assistance w/ latching and mother states she only wants to pump and bottle feed. She has Medela Backpack pump at home.    Maternal Data    Feeding Feeding Type: Formula  LATCH Score                   Interventions Interventions: Hand pump  Lactation Tools Discussed/Used     Consult Status Consult Status: Complete Date: 12/09/18 Follow-up type: In-patient    Vivianne Master [facility] 12/08/2018, 9:02 AM

## 2018-12-13 ENCOUNTER — Encounter: Payer: Medicaid Other | Admitting: Obstetrics and Gynecology

## 2018-12-20 ENCOUNTER — Encounter: Payer: Medicaid Other | Admitting: Obstetrics and Gynecology

## 2019-01-03 ENCOUNTER — Other Ambulatory Visit: Payer: Self-pay

## 2019-01-03 ENCOUNTER — Ambulatory Visit (INDEPENDENT_AMBULATORY_CARE_PROVIDER_SITE_OTHER): Payer: Medicaid Other | Admitting: Women's Health

## 2019-01-03 ENCOUNTER — Encounter: Payer: Self-pay | Admitting: Women's Health

## 2019-01-03 DIAGNOSIS — Z1389 Encounter for screening for other disorder: Secondary | ICD-10-CM | POA: Diagnosis not present

## 2019-01-03 DIAGNOSIS — D649 Anemia, unspecified: Secondary | ICD-10-CM

## 2019-01-03 DIAGNOSIS — O2343 Unspecified infection of urinary tract in pregnancy, third trimester: Secondary | ICD-10-CM | POA: Diagnosis not present

## 2019-01-03 DIAGNOSIS — Z3042 Encounter for surveillance of injectable contraceptive: Secondary | ICD-10-CM | POA: Diagnosis not present

## 2019-01-03 LAB — POCT PREGNANCY, URINE: Preg Test, Ur: NEGATIVE

## 2019-01-03 MED ORDER — MEDROXYPROGESTERONE ACETATE 150 MG/ML IM SUSP
150.0000 mg | Freq: Once | INTRAMUSCULAR | Status: AC
  Administered 2019-01-03: 150 mg via INTRAMUSCULAR

## 2019-01-03 NOTE — Progress Notes (Signed)
Subjective:     [name] is a 28 y.o. female who presents for a postpartum visit. She is 4 weeks postpartum following a SVD. I have fully reviewed the prenatal and intrapartum course. The delivery was at [redacted]w[redacted]d gestational weeks. Outcome: spontaneous vaginal delivery. Anesthesia: none. Postpartum course has been unremarkable. Baby's course has been unremarkable. Baby is feeding by both breast and bottle - Similac Neosure. Bleeding no bleeding. Bowel function is normal. Bladder function is normal. Patient is not sexually active. Contraception method is Depo-Provera injections. Postpartum depression screening: Negative  Pt elects Depo, but reports she did not get it in the hospital and would like an injection today. Pt denies any medical problems and reports NKDA. Pt has used Depo in the past and was satisfied with the method. Pt reports eventually she would like a tubal ligation, but her fiancee does not want to be left alone with the baby while she goes for surgery, so she will wait until the baby is a little bit older. Pt denies intercourse since delivery. UPT negative today.  The following portions of the patient's history were reviewed and updated as appropriate: allergies, current medications, past family history, past medical history, past social history, past surgical history and problem list.  Review of Systems Pertinent items noted in HPI and remainder of comprehensive ROS otherwise negative.   Objective:    BP 110/73   Pulse 85   Wt 112 lb (50.8 kg)   BMI 19.84 kg/m   General:  alert, cooperative and no distress   Breasts:  pt declines examination.  Lungs: clear to auscultation bilaterally  Heart:  regular rate and rhythm, S1, S2 normal, no murmur, click, rub or gallop  Abdomen: soft, non-tender; bowel sounds normal; no masses,  no organomegaly   Vulva:  not evaluated  Vagina: not evaluated  Cervix:  pt declines pelvic exam.  Corpus: not examined  Adnexa:  not evaluated   Rectal Exam: Not performed.        Assessment:    Normal postpartum exam. Pap smear not done at today's visit.  Last Pap 08/2018, NILM.  Plan:    1. Contraception: Depo-Provera injections, given today. 2. Flu vaccine got in hospital 12/07/2018 3. CBC and HgbA1C today - pt had hgb 9.1 in hospital and did not have glucose testing done during pregnancy. A1C OK per consultation with Dr. Ilda Basset. 3. Follow up in: 8 months for WWE or as needed.

## 2019-01-03 NOTE — Patient Instructions (Addendum)
Postpartum Baby Blues The postpartum period begins right after the birth of a baby. During this time, there is often a lot of joy and excitement. It is also a time of many changes in the life of the parents. No matter how many times a mother gives birth, each child brings new challenges to the family, including different ways of relating to one another. It is common to have feelings of excitement along with confusing changes in moods, emotions, and thoughts. You may feel happy one minute and sad or stressed the next. These feelings of sadness usually happen in the period right after you have your baby, and they go away within a week or two. This is called the "baby blues." What are the causes? There is no known cause of baby blues. It is likely caused by a combination of factors. However, changes in hormone levels after childbirth are believed to trigger some of the symptoms. Other factors that can play a role in these mood changes include:  Lack of sleep.  Stressful life events, such as poverty, caring for a loved one, or death of a loved one.  Genetics. What are the signs or symptoms? Symptoms of this condition include:  Brief changes in mood, such as going from extreme happiness to sadness.  Decreased concentration.  Difficulty sleeping.  Crying spells and tearfulness.  Loss of appetite.  Irritability.  Anxiety. If the symptoms of baby blues last for more than 2 weeks or become more severe, you may have postpartum depression. How is this diagnosed? This condition is diagnosed based on an evaluation of your symptoms. There are no medical or lab tests that lead to a diagnosis, but there are various questionnaires that a health care provider may use to identify women with the baby blues or postpartum depression. How is this treated? Treatment is not needed for this condition. The baby blues usually go away on their own in 1-2 weeks. Social support is often all that is needed. You will  be encouraged to get adequate sleep and rest. Follow these instructions at home: Lifestyle      Get as much rest as you can. Take a nap when the baby sleeps.  Exercise regularly as told by your health care provider. Some women find yoga and walking to be helpful.  Eat a balanced and nourishing diet. This includes plenty of fruits and vegetables, whole grains, and lean proteins.  Do little things that you enjoy. Have a cup of tea, take a bubble bath, read your favorite magazine, or listen to your favorite music.  Avoid alcohol.  Ask for help with household chores, cooking, grocery shopping, or running errands. Do not try to do everything yourself. Consider hiring a postpartum doula to help. This is a professional who specializes in providing support to new mothers.  Try not to make any major life changes during pregnancy or right after giving birth. This can add stress. General instructions  Talk to people close to you about how you are feeling. Get support from your partner, family members, friends, or other new moms. You may want to join a support group.  Find ways to cope with stress. This may include: ? Writing your thoughts and feelings in a journal. ? Spending time outside. ? Spending time with people who make you laugh.  Try to stay positive in how you think. Think about the things you are grateful for.  Take over-the-counter and prescription medicines only as told by your health care provider.    Let your health care provider know if you have any concerns.  Keep all postpartum visits as told by your health care provider. This is important. Contact a health care provider if:  Your baby blues do not go away after 2 weeks. Get help right away if:  You have thoughts of taking your own life (suicidal thoughts).  You think you may harm the baby or other people.  You see or hear things that are not there (hallucinations). Summary  After giving birth, you may feel happy  one minute and sad or stressed the next. Feelings of sadness that happen right after the baby is born and go away after a week or two are called the "baby blues."  You can manage the baby blues by getting enough rest, eating a healthy diet, exercising, spending time with supportive people, and finding ways to cope with stress.  If feelings of sadness and stress last longer than 2 weeks or get in the way of caring for your baby, talk to your health care provider. This may mean you have postpartum depression. This information is not intended to replace advice given to you by your health care provider. Make sure you discuss any questions you have with your health care provider. Document Released: 11/27/2003 Document Revised: 06/16/2018 Document Reviewed: 04/20/2016 Elsevier Patient Education  2020 [employer] Postpartum Care After Vaginal Delivery This sheet gives you information about how to care for yourself from the time you deliver your baby to up to 6-12 weeks after delivery (postpartum period). Your health care provider may also give you more specific instructions. If you have problems or questions, contact your health care provider. Follow these instructions at home: Vaginal bleeding  It is normal to have vaginal bleeding (lochia) after delivery. Wear a sanitary pad for vaginal bleeding and discharge. ? During the first week after delivery, the amount and appearance of lochia is often similar to a menstrual period. ? Over the next few weeks, it will gradually decrease to a dry, yellow-brown discharge. ? For most women, lochia stops completely by 4-6 weeks after delivery. Vaginal bleeding can vary from woman to woman.  Change your sanitary pads frequently. Watch for any changes in your flow, such as: ? A sudden increase in volume. ? A change in color. ? Large blood clots.  If you pass a blood clot from your vagina, save it and call your health care provider to discuss. Do not flush blood  clots down the toilet before talking with your health care provider.  Do not use tampons or douches until your health care provider says this is safe.  If you are not breastfeeding, your period should return 6-8 weeks after delivery. If you are feeding your child breast milk only (exclusive breastfeeding), your period may not return until you stop breastfeeding. Perineal care  Keep the area between the vagina and the anus (perineum) clean and dry as told by your health care provider. Use medicated pads and pain-relieving sprays and creams as directed.  If you had a cut in the perineum (episiotomy) or a tear in the vagina, check the area for signs of infection until you are healed. Check for: ? More redness, swelling, or pain. ? Fluid or blood coming from the cut or tear. ? Warmth. ? Pus or a bad smell.  You may be given a squirt bottle to use instead of wiping to clean the perineum area after you go to the bathroom. As you start healing, you may use  the squirt bottle before wiping yourself. Make sure to wipe gently.  To relieve pain caused by an episiotomy, a tear in the vagina, or swollen veins in the anus (hemorrhoids), try taking a warm sitz bath 2-3 times a day. A sitz bath is a warm water bath that is taken while you are sitting down. The water should only come up to your hips and should cover your buttocks. Breast care  Within the first few days after delivery, your breasts may feel heavy, full, and uncomfortable (breast engorgement). Milk may also leak from your breasts. Your health care provider can suggest ways to help relieve the discomfort. Breast engorgement should go away within a few days.  If you are breastfeeding: ? Wear a bra that supports your breasts and fits you well. ? Keep your nipples clean and dry. Apply creams and ointments as told by your health care provider. ? You may need to use breast pads to absorb milk that leaks from your breasts. ? You may have uterine  contractions every time you breastfeed for up to several weeks after delivery. Uterine contractions help your uterus return to its normal size. ? If you have any problems with breastfeeding, work with your health care provider or Advertising copywriterlactation consultant.  If you are not breastfeeding: ? Avoid touching your breasts a lot. Doing this can make your breasts produce more milk. ? Wear a good-fitting bra and use cold packs to help with swelling. ? Do not squeeze out (express) milk. This causes you to make more milk. Intimacy and sexuality  Ask your health care provider when you can engage in sexual activity. This may depend on: ? Your risk of infection. ? How fast you are healing. ? Your comfort and desire to engage in sexual activity.  You are able to get pregnant after delivery, even if you have not had your period. If desired, talk with your health care provider about methods of birth control (contraception). Medicines  Take over-the-counter and prescription medicines only as told by your health care provider.  If you were prescribed an antibiotic medicine, take it as told by your health care provider. Do not stop taking the antibiotic even if you start to feel better. Activity  Gradually return to your normal activities as told by your health care provider. Ask your health care provider what activities are safe for you.  Rest as much as possible. Try to rest or take a nap while your baby is sleeping. Eating and drinking   Drink enough fluid to keep your urine pale yellow.  Eat high-fiber foods every day. These may help prevent or relieve constipation. High-fiber foods include: ? Whole grain cereals and breads. ? Brown rice. ? Beans. ? Fresh fruits and vegetables.  Do not try to lose weight quickly by cutting back on calories.  Take your prenatal vitamins until your postpartum checkup or until your health care provider tells you it is okay to stop. Lifestyle  Do not use any products  that contain nicotine or tobacco, such as cigarettes and e-cigarettes. If you need help quitting, ask your health care provider.  Do not drink alcohol, especially if you are breastfeeding. General instructions  Keep all follow-up visits for you and your baby as told by your health care provider. Most women visit their health care provider for a postpartum checkup within the first 3-6 weeks after delivery. Contact a health care provider if:  You feel unable to cope with the changes that your child brings  to your life, and these feelings do not go away.  You feel unusually sad or worried.  Your breasts become red, painful, or hard.  You have a fever.  You have trouble holding urine or keeping urine from leaking.  You have little or no interest in activities you used to enjoy.  You have not breastfed at all and you have not had a menstrual period for 12 weeks after delivery.  You have stopped breastfeeding and you have not had a menstrual period for 12 weeks after you stopped breastfeeding.  You have questions about caring for yourself or your baby.  You pass a blood clot from your vagina. Get help right away if:  You have chest pain.  You have difficulty breathing.  You have sudden, severe leg pain.  You have severe pain or cramping in your lower abdomen.  You bleed from your vagina so much that you fill more than one sanitary pad in one hour. Bleeding should not be heavier than your heaviest period.  You develop a severe headache.  You faint.  You have blurred vision or spots in your vision.  You have bad-smelling vaginal discharge.  You have thoughts about hurting yourself or your baby. If you ever feel like you may hurt yourself or others, or have thoughts about taking your own life, get help right away. You can go to the nearest emergency department or call:  Your local emergency services (911 in the [place]  A suicide crisis helpline, such as the National Suicide  Prevention Lifeline at (612) 652-3762. This is open 24 hours a day. Summary  The period of time right after you deliver your newborn up to 6-12 weeks after delivery is called the postpartum period.  Gradually return to your normal activities as told by your health care provider.  Keep all follow-up visits for you and your baby as told by your health care provider. This information is not intended to replace advice given to you by your health care provider. Make sure you discuss any questions you have with your health care provider. Document Released: 12/20/2006 Document Revised: 02/25/2017 Document Reviewed: 12/06/2016 Elsevier Patient Education  2020 Elsevier Inc. Laparoscopic Tubal Ligation Laparoscopic tubal ligation is a procedure to close the fallopian tubes. This is done so that you cannot get pregnant. When the fallopian tubes are closed, the eggs that your ovaries release cannot enter the uterus, and sperm cannot reach the released eggs. You should not have this procedure if you want to get pregnant someday or if you are unsure about having more children. Tell a health care provider about:  Any allergies you have.  All medicines you are taking, including vitamins, herbs, eye drops, creams, and over-the-counter medicines.  Any problems you or family members have had with anesthetic medicines.  Any blood disorders you have.  Any surgeries you have had.  Any medical conditions you have.  Whether you are pregnant or may be pregnant.  Any past pregnancies. What are the risks? Generally, this is a safe procedure. However, problems may occur, including:  Infection.  Bleeding.  Injury to other organs in the abdomen.  Side effects from anesthetic medicines.  Failure of the procedure. This procedure can increase your risk of a kind of pregnancy in which a fertilized egg attaches to the outside of the uterus (ectopic pregnancy). What happens before the procedure? Medicines   Ask your health care provider about: ? Changing or stopping your regular medicines. This is especially important if you  are taking diabetes medicines or blood thinners. ? Taking medicines such as aspirin and ibuprofen. These medicines can thin your blood. Do not take these medicines unless your health care provider tells you to take them. ? Taking over-the-counter medicines, vitamins, herbs, and supplements. Staying hydrated  Follow instructions from your health care provider about hydration, which may include: ? Up to 2 hours before the procedure - you may continue to drink clear liquids, such as water, clear fruit juice, black coffee, and plain tea. Eating and drinking  Follow instructions from your health care provider about eating and drinking, which may include: ? 8 hours before the procedure - stop eating heavy meals or foods, such as meat, fried foods, or fatty foods. ? 6 hours before the procedure - stop eating light meals or foods, such as toast or cereal. ? 6 hours before the procedure - stop drinking milk or drinks that contain milk. ? 2 hours before the procedure - stop drinking clear liquids. General instructions  Do not use any products that contain nicotine or tobacco for at least 4 weeks before the procedure. These products include cigarettes, e-cigarettes, and chewing tobacco. If you need help quitting, ask your health care provider.  Plan to have someone take you home from the hospital.  If you will be going home right after the procedure, plan to have someone with you for 24 hours.  Ask your health care provider: ? How your surgery site will be marked. ? What steps will be taken to help prevent infection. These may include:  Removing hair at the surgery site.  Washing skin with a germ-killing soap.  Taking antibiotic medicine. What happens during the procedure?      An IV will be inserted into one of your veins.  You will be given one or more of the  following: ? A medicine to help you relax (sedative). ? A medicine to numb the area (local anesthetic). ? A medicine to make you fall asleep (general anesthetic). ? A medicine that is injected into an area of your body to numb everything below the injection site (regional anesthetic).  Your bladder may be emptied with a small tube (catheter).  If you have been given a general anesthetic, a tube will be put down your throat to help you breathe.  Two small incisions will be made in your lower abdomen and near your belly button.  Your abdomen will be inflated with a gas. This will let the surgeon see better and will give the surgeon room to work.  A thin, lighted tube (laparoscope) with a camera attached will be inserted into your abdomen through one of the incisions. Small instruments will be inserted through the other incision.  The fallopian tubes will be tied off, burned (cauterized), or blocked with a clip, ring, or clamp. A small portion in the center of each fallopian tube may be removed.  The gas will be released from the abdomen.  The incisions will be closed with stitches (sutures).  A bandage (dressing) will be placed over the incisions. The procedure may vary among health care providers and hospitals. What happens after the procedure?  Your blood pressure, heart rate, breathing rate, and blood oxygen level will be monitored until you leave the hospital.  You will be given medicine to help with pain, nausea, and vomiting as needed. Summary  Laparoscopic tubal ligation is a procedure that is done so that you cannot get pregnant.  You should not have this procedure if  you want to get pregnant someday or if you are unsure about having more children.  The procedure is done using a thin, lighted tube (laparoscope) with a camera attached that will be inserted into your abdomen through an incision.  Follow instructions from your health care provider about eating and drinking  before the procedure. This information is not intended to replace advice given to you by your health care provider. Make sure you discuss any questions you have with your health care provider. Document Released: 05/31/2000 Document Revised: 01/17/2018 Document Reviewed: 01/17/2018 Elsevier Patient Education  2020 [employer].   You have received a prescription for the Depo-Provera injection for birth control today. You are not fully protected from pregnancy until 7 days after your injection. If you have sex in the next week, use a backup method to prevent pregnancy, such as a condom. Return in 3 months for your next shot - refer to handout provided for dates. The Depo-Provera injection does not protect from sexually transmitted infections (STIs). Use condoms with each new or untested partner.  Take calcium and vitamin D (either through diet or by added calcium and vitamin D supplements). Get regular weight-bearing exercises like walking, running, and weight-lifting.

## 2019-01-04 LAB — CBC
Hematocrit: 36.3 % (ref 34.0–46.6)
Hemoglobin: 11.5 g/dL (ref 11.1–15.9)
MCH: 24.2 pg — ABNORMAL LOW (ref 26.6–33.0)
MCHC: 31.7 g/dL (ref 31.5–35.7)
MCV: 76 fL — ABNORMAL LOW (ref 79–97)
Platelets: 279 10*3/uL (ref 150–450)
RBC: 4.75 x10E6/uL (ref 3.77–5.28)
RDW: 18.5 % — ABNORMAL HIGH (ref 11.7–15.4)
WBC: 4.5 10*3/uL (ref 3.4–10.8)

## 2019-01-04 LAB — URINE CULTURE: Organism ID, Bacteria: NO GROWTH

## 2019-01-04 LAB — HEMOGLOBIN A1C
Est. average glucose Bld gHb Est-mCnc: 108 mg/dL
Hgb A1c MFr Bld: 5.4 % (ref 4.8–5.6)

## 2019-04-06 ENCOUNTER — Other Ambulatory Visit: Payer: Self-pay

## 2019-04-06 ENCOUNTER — Ambulatory Visit (INDEPENDENT_AMBULATORY_CARE_PROVIDER_SITE_OTHER): Payer: Medicaid Other | Admitting: *Deleted

## 2019-04-06 ENCOUNTER — Encounter: Payer: Self-pay | Admitting: *Deleted

## 2019-04-06 VITALS — BP 107/58 | HR 67 | Temp 98.1°F | Ht 63.0 in | Wt 110.9 lb

## 2019-04-06 DIAGNOSIS — Z3042 Encounter for surveillance of injectable contraceptive: Secondary | ICD-10-CM

## 2019-04-06 MED ORDER — MEDROXYPROGESTERONE ACETATE 150 MG/ML IM SUSP
150.0000 mg | Freq: Once | INTRAMUSCULAR | Status: AC
  Administered 2019-04-06: 150 mg via INTRAMUSCULAR

## 2019-04-06 NOTE — Progress Notes (Signed)
Depo Provera

## 2019-04-13 NOTE — Progress Notes (Signed)
I have reviewed this chart and agree with the RN/CMA assessment and management.    Eviana Sibilia C Sheena Simonis, MD, FACOG Attending Physician, [facility]  

## 2019-06-22 ENCOUNTER — Ambulatory Visit: Payer: Medicaid Other

## 2019-07-17 ENCOUNTER — Ambulatory Visit: Payer: Medicaid Other

## 2019-08-17 ENCOUNTER — Ambulatory Visit: Payer: Medicaid Other

## 2019-08-28 ENCOUNTER — Ambulatory Visit: Payer: Medicaid Other

## 2019-09-08 IMAGING — US US MFM FETAL BPP W/O NON-STRESS
1 series · 12 of 12 positions shown · non-contrast
Comparison: none

[Series 1: us mfm fetal bpp w/o non-stress · 12 acquisitions, 12 frames shown]
[im 1/12]
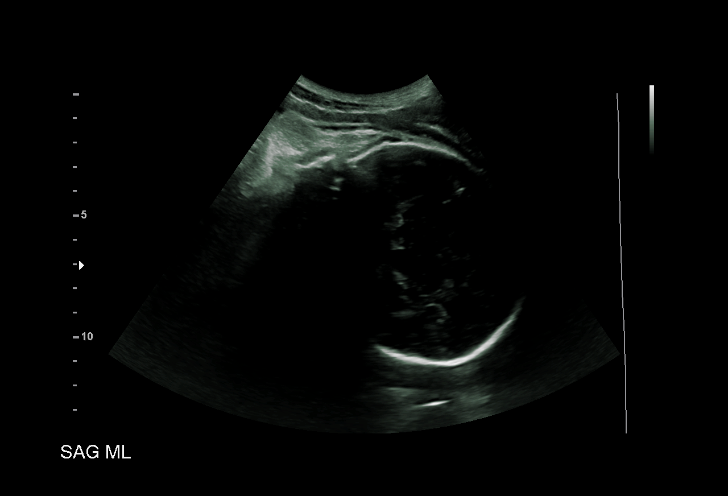
[im 2/12]
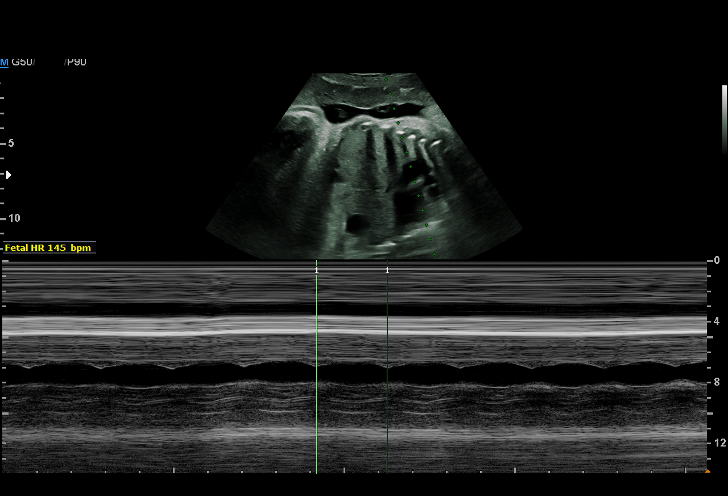
[im 3/12]
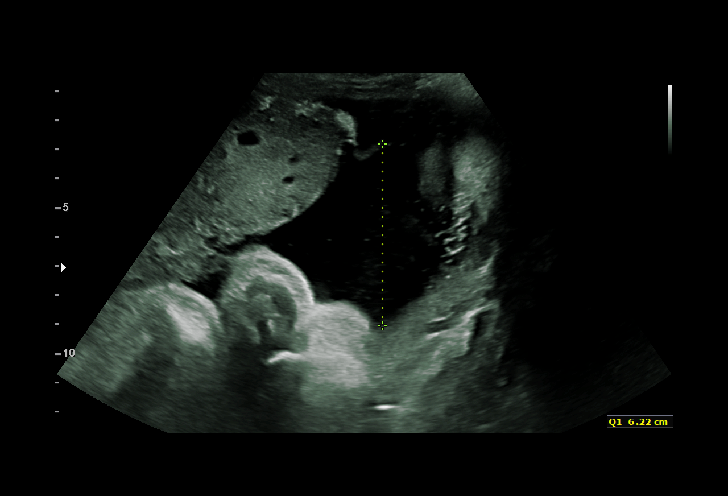
[im 4/12]
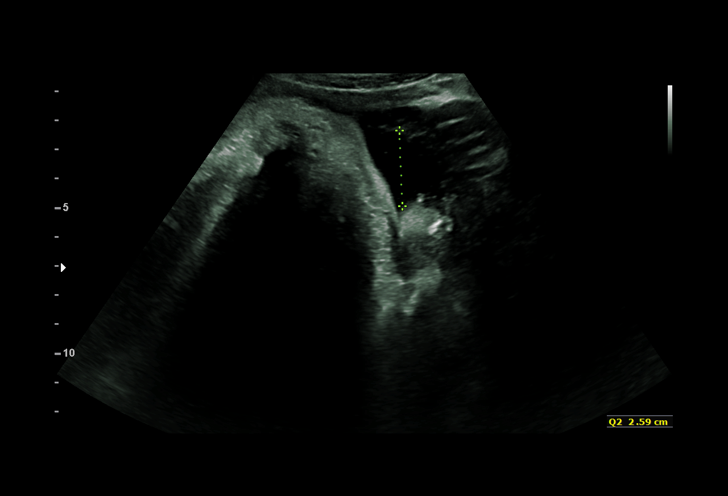
[im 5/12]
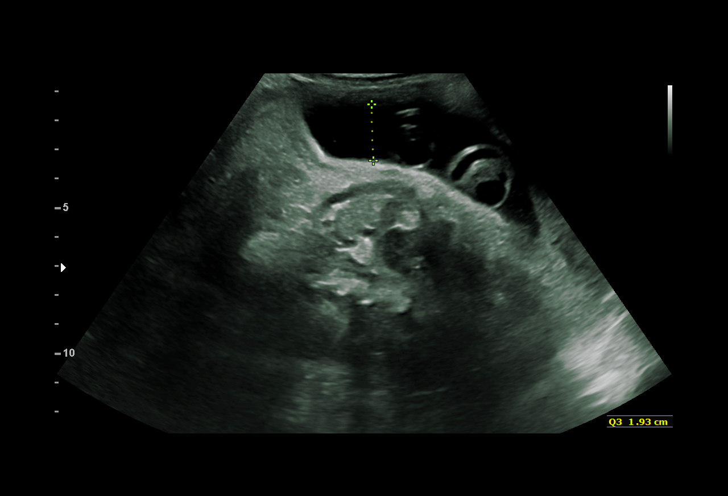
[im 6/12]
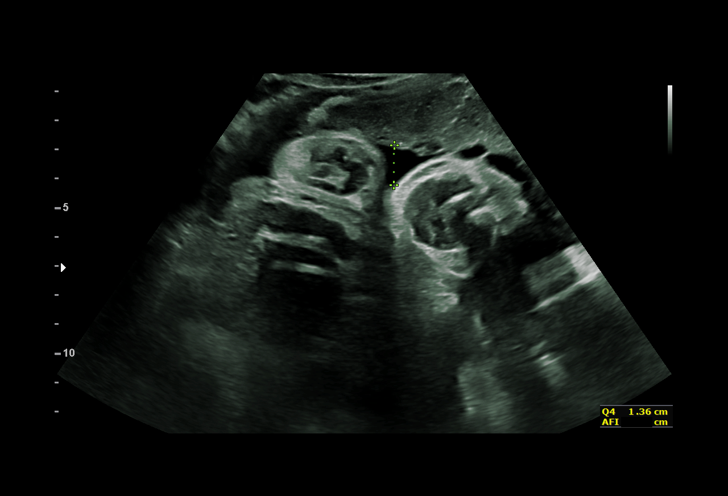
[im 7/12]
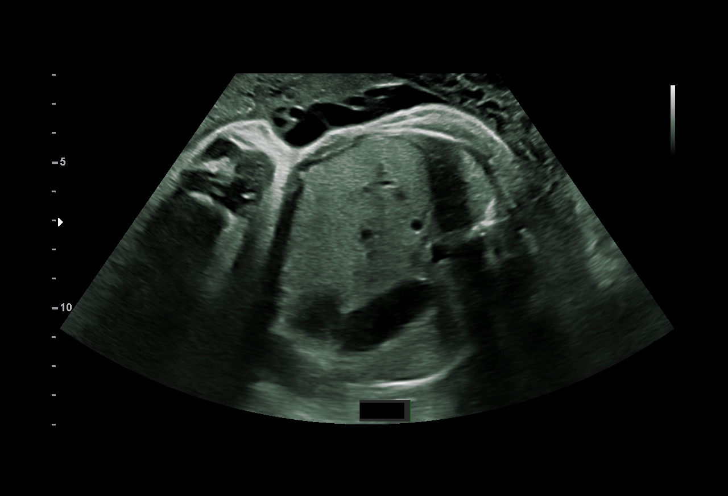
[im 8/12]
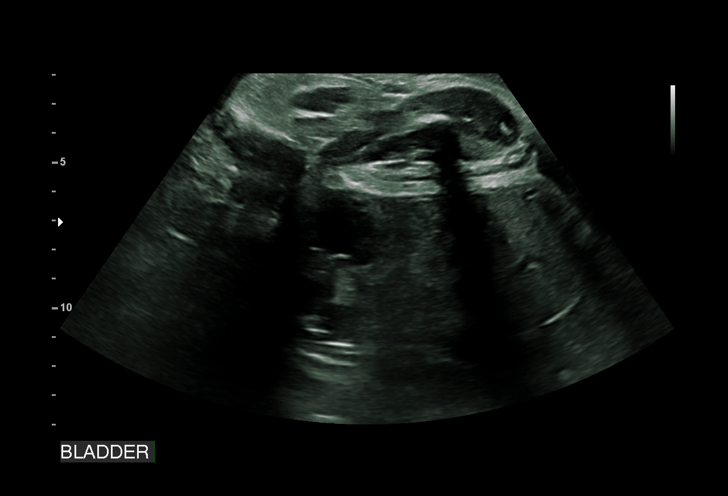
[im 9/12]
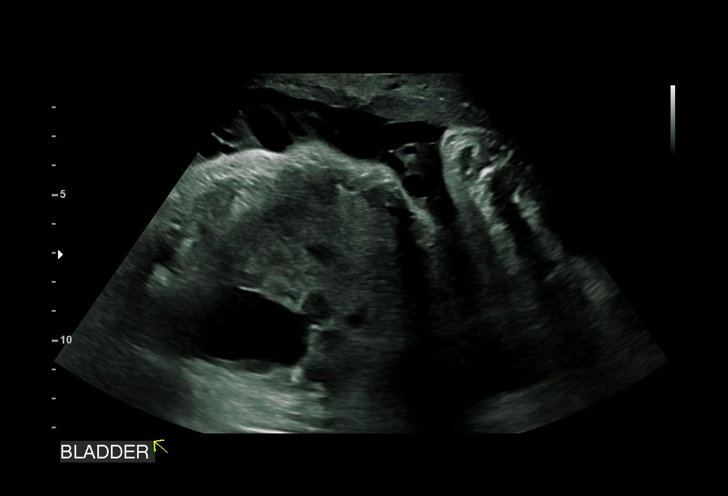
[im 10/12]
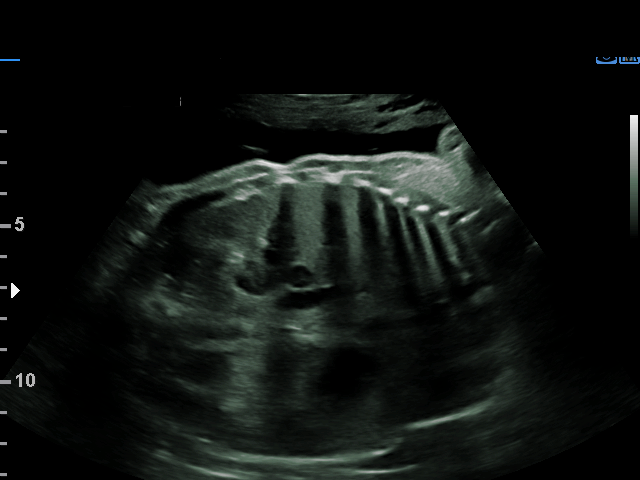
[im 11/12]
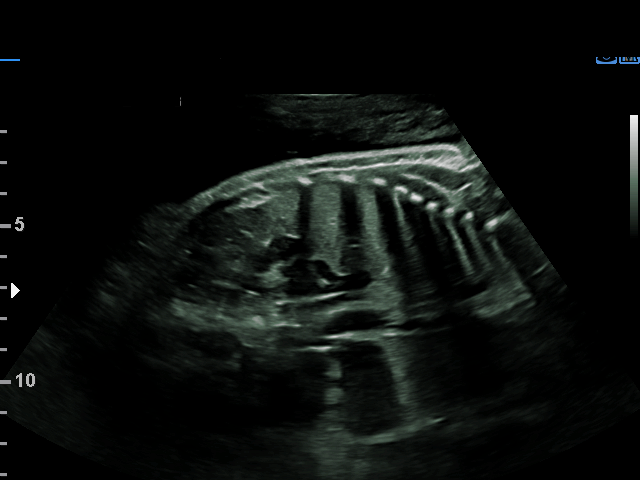
[im 12/12]
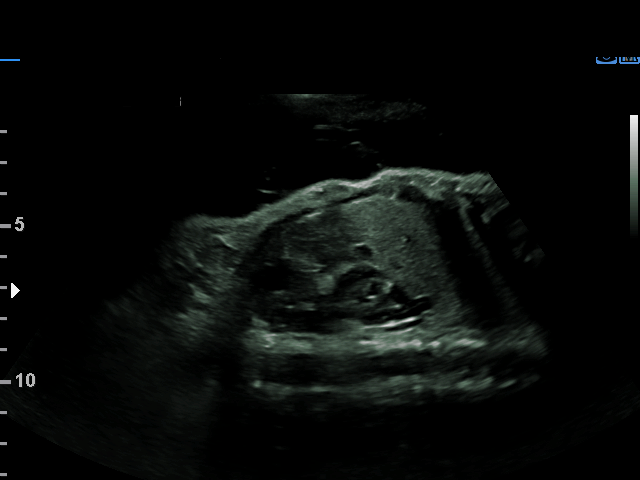

[12 of 12 positions shown; findings below may reference images not displayed]

MAU/Triage

1  BENOS LAZ          [id number]      [id number]     [id number]
Indications

37 weeks gestation of pregnancy
Variable fetal heart rate decelerations,
antepartum
Poor obstetric history: Previous preterm
delivery, antepartum (x3)
OB History

Blood Type:            Height:  5'3"   Weight (lb):  92        BMI:
Gravidity:    5         Prem:   3         SAB:   1
Living:       3
Fetal Evaluation

Num Of Fetuses:     1
Fetal Heart         145
Rate(bpm):
Cardiac Activity:   Observed
Presentation:       Cephalic

Amniotic Fluid
AFI FV:      Subjectively within normal limits

AFI Sum(cm)     %Tile       Largest Pocket(cm)
12.1            41

RUQ(cm)       RLQ(cm)       LUQ(cm)        LLQ(cm)
6.22
Biophysical Evaluation
Amniotic F.V:   Within normal limits       F. Tone:        Observed
F. Movement:    Observed                   Score:          [DATE]
F. Breathing:   Observed
Gestational Age

Best:          37w 6d     Det. By:  U/S  (03/22/17)          EDD:   07/27/17
Anatomy

Stomach:               Appears normal, left   Bladder:                Appears normal
sided
Impression

Intrauterine pregnancy at 37+6 weeks with catagory 2 FHR
tracing
Normal amniotic fluid
BPP [DATE]
Recommendations

Continue clinical evaluation and management.

## 2019-09-26 ENCOUNTER — Encounter: Payer: Self-pay | Admitting: Women's Health

## 2019-09-26 ENCOUNTER — Ambulatory Visit: Payer: Medicaid Other | Admitting: Women's Health

## 2019-12-05 ENCOUNTER — Ambulatory Visit (INDEPENDENT_AMBULATORY_CARE_PROVIDER_SITE_OTHER): Payer: Medicaid Other

## 2019-12-05 ENCOUNTER — Other Ambulatory Visit: Payer: Self-pay

## 2019-12-05 VITALS — BP 109/65 | HR 68 | Ht 63.0 in | Wt 98.8 lb

## 2019-12-05 DIAGNOSIS — Z3042 Encounter for surveillance of injectable contraceptive: Secondary | ICD-10-CM | POA: Diagnosis not present

## 2019-12-05 LAB — POCT PREGNANCY, URINE: Preg Test, Ur: NEGATIVE

## 2019-12-05 MED ORDER — MEDROXYPROGESTERONE ACETATE 150 MG/ML IM SUSP
150.0000 mg | Freq: Once | INTRAMUSCULAR | Status: AC
  Administered 2019-12-05: 150 mg via INTRAMUSCULAR

## 2019-12-05 NOTE — Progress Notes (Signed)
[name] here for Depo-Provera  Injection.  Injection administered without complication. Patient will return in 3 months for next injection.  [name], CMA 12/05/2019  8:55 AM     Also UPT Test: Negative

## 2020-01-08 ENCOUNTER — Ambulatory Visit: Payer: Medicaid Other | Admitting: Women's Health

## 2020-02-20 ENCOUNTER — Ambulatory Visit: Payer: Medicaid Other

## 2020-04-10 ENCOUNTER — Ambulatory Visit (INDEPENDENT_AMBULATORY_CARE_PROVIDER_SITE_OTHER): Payer: Medicaid Other

## 2020-04-10 ENCOUNTER — Other Ambulatory Visit: Payer: Self-pay

## 2020-04-10 VITALS — BP 102/65 | HR 68 | Wt 97.7 lb

## 2020-04-10 DIAGNOSIS — Z3202 Encounter for pregnancy test, result negative: Secondary | ICD-10-CM

## 2020-04-10 DIAGNOSIS — Z3042 Encounter for surveillance of injectable contraceptive: Secondary | ICD-10-CM | POA: Diagnosis not present

## 2020-04-10 LAB — POCT PREGNANCY, URINE: Preg Test, Ur: NEGATIVE

## 2020-04-10 MED ORDER — MEDROXYPROGESTERONE ACETATE 150 MG/ML IM SUSP
150.0000 mg | Freq: Once | INTRAMUSCULAR | Status: AC
  Administered 2020-04-10: 150 mg via INTRAMUSCULAR

## 2020-04-10 NOTE — Addendum Note (Signed)
Addended by: Faythe Casa on: 04/10/2020 03:17 PM   Modules accepted: Orders

## 2020-04-10 NOTE — Progress Notes (Signed)
Chart reviewed for nurse visit. Agree with plan of care.   Roneisha Stern Lorraine, CNM 04/10/2020 4:58 PM   

## 2020-04-10 NOTE — Progress Notes (Signed)
[name] here for Depo-Provera Injection. Injection administered without complication. Patient will return in 3 months for next injection between April 21 and May 5. Next annual visit due with next Depo injection.   Ralene Bathe, RN 04/10/2020  2:53 PM

## 2021-02-12 ENCOUNTER — Other Ambulatory Visit: Payer: Self-pay

## 2021-02-12 ENCOUNTER — Ambulatory Visit (INDEPENDENT_AMBULATORY_CARE_PROVIDER_SITE_OTHER): Payer: Medicaid Other | Admitting: Certified Nurse Midwife

## 2021-02-12 VITALS — BP 100/65 | Wt 93.8 lb

## 2021-02-12 DIAGNOSIS — Z3009 Encounter for other general counseling and advice on contraception: Secondary | ICD-10-CM

## 2021-02-12 DIAGNOSIS — Z23 Encounter for immunization: Secondary | ICD-10-CM | POA: Diagnosis not present

## 2021-02-12 LAB — POCT PREGNANCY, URINE: Preg Test, Ur: NEGATIVE

## 2021-02-12 MED ORDER — MEDROXYPROGESTERONE ACETATE 150 MG/ML IM SUSP
150.0000 mg | Freq: Once | INTRAMUSCULAR | Status: AC
  Administered 2021-02-12: 150 mg via INTRAMUSCULAR

## 2021-02-15 NOTE — Progress Notes (Signed)
History:  [name] is a 30 y.o. [id number] who presents to clinic today to discuss birth control options as she may want to switch from the Depo shot. No gynecological concerns.  The following portions of the patient's history were reviewed and updated as appropriate: allergies, current medications, family history, past medical history, social history, past surgical history and problem list.  Review of Systems:  Pertinent items noted in HPI and remainder of comprehensive ROS otherwise negative.     Objective:  Physical Exam BP 100/65   Wt 93 lb 12.8 oz (42.5 kg)   LMP 01/22/2021 (Exact Date)   Breastfeeding No   BMI 16.62 kg/m  Physical Exam Vitals and nursing note reviewed.  Constitutional:      Appearance: Normal appearance.  HENT:     Head: Normocephalic.  Eyes:     Pupils: Pupils are equal, round, and reactive to light.  Cardiovascular:     Rate and Rhythm: Normal rate.  Pulmonary:     Effort: Pulmonary effort is normal.  Musculoskeletal:        General: Normal range of motion.  Skin:    General: Skin is warm and dry.     Capillary Refill: Capillary refill takes less than 2 seconds.  Neurological:     Mental Status: She is alert and oriented to person, place, and time.  Psychiatric:        Mood and Affect: Mood normal.        Behavior: Behavior normal.        Thought Content: Thought content normal.   Labs and Imaging No results found for this or any previous visit (from the past 24 hour(s)).  No results found.   Assessment & Plan:  1. Birth control counseling - Reviewed other methods of birth control including OCPs, POPs, IUD, Nexplanon, etc. Pt does not want to take a daily pill and does not want anything inserted. Decided to continue with Depo. Pregnancy test negative. Depo and flu shot given and tolerated well.  Follow up for next Depo shot and in June 2023 for next pap.  Edd Arbour, CNM, MSN, IBCLC Certified Nurse Midwife, [facility]

## 2021-04-22 ENCOUNTER — Ambulatory Visit: Payer: Medicaid Other

## 2021-05-06 ENCOUNTER — Ambulatory Visit: Payer: Medicaid Other

## 2021-09-15 ENCOUNTER — Telehealth: Payer: Medicaid Other | Admitting: Physician Assistant

## 2021-09-15 DIAGNOSIS — N898 Other specified noninflammatory disorders of vagina: Secondary | ICD-10-CM

## 2021-09-15 NOTE — Progress Notes (Signed)
Duplicate encounter

## 2021-09-15 NOTE — Progress Notes (Signed)
Because of yellow/green discharge, I feel your condition warrants further evaluation and I recommend that you be seen for a face to face visit.  You need a swab to help determine cause of symptoms so the proper medication(s) can be given to take care of this. Please contact your primary care physician practice to be seen. Many offices offer virtual options to be seen via video if you are not comfortable going in person to a medical facility at this time.  NOTE: You will NOT be charged for this eVisit.  If you do not have a PCP, [facility] offers a free physician referral service available at [phone]. Our trained staff has the experience, knowledge and resources to put you in touch with a physician who is right for you.    If you are having a true medical emergency please call 911.   Your e-visit answers were reviewed by a board certified advanced clinical practitioner to complete your personal care plan.  Thank you for using e-Visits.

## 2021-09-16 ENCOUNTER — Ambulatory Visit
Admission: RE | Admit: 2021-09-16 | Discharge: 2021-09-16 | Disposition: A | Discharge status: Home / Self Care | Payer: Medicaid Other | Source: Ambulatory Visit | Attending: Emergency Medicine | Admitting: Emergency Medicine

## 2021-09-16 ENCOUNTER — Ambulatory Visit: Payer: Medicaid Other

## 2021-09-16 VITALS — BP 103/69 | HR 74 | Temp 97.5°F | Resp 14

## 2021-09-16 DIAGNOSIS — N898 Other specified noninflammatory disorders of vagina: Secondary | ICD-10-CM

## 2021-09-16 DIAGNOSIS — Z113 Encounter for screening for infections with a predominantly sexual mode of transmission: Secondary | ICD-10-CM | POA: Diagnosis not present

## 2021-09-16 DIAGNOSIS — Z3A01 Less than 8 weeks gestation of pregnancy: Secondary | ICD-10-CM | POA: Diagnosis not present

## 2021-09-16 DIAGNOSIS — N390 Urinary tract infection, site not specified: Secondary | ICD-10-CM

## 2021-09-16 LAB — POCT URINALYSIS DIP (MANUAL ENTRY)
Bilirubin, UA: NEGATIVE
Glucose, UA: NEGATIVE mg/dL
Nitrite, UA: POSITIVE — AB
Protein Ur, POC: 30 mg/dL — AB
Spec Grav, UA: 1.02 (ref 1.010–1.025)
Urobilinogen, UA: 4 E.U./dL — AB
pH, UA: 7.5 (ref 5.0–8.0)

## 2021-09-16 LAB — POCT URINE PREGNANCY: Preg Test, Ur: POSITIVE — AB

## 2021-09-16 MED ORDER — AMOXICILLIN-POT CLAVULANATE 875-125 MG PO TABS: 1.0000 | ORAL_TABLET | Freq: Two times a day (BID) | ORAL | 0 refills | Status: DC

## 2021-09-16 NOTE — ED Provider Notes (Signed)
[facility]    CSN: [id number] Arrival date & time: 09/16/21  1607    HISTORY   Chief Complaint  Patient presents with   Vaginal Discharge    Discharge is yellowish/ greenish with a foul and urine has a strong odor sometimes. But there is no itching or burning. But I just got in a relationship after 3 years. - Entered by patient   HPI [name] is a pleasant, 31 y.o. female who presents to urgent care today complaining of Thick, dark yellow-colored vaginal discharge that began 2 days ago.  Patient states she has not noticed foul odor but states it is not abnormal odor for her.  Patient denies pelvic pain, pelvic pressure, increased frequency of urination, vaginal itching, vaginal irritation.  Patient states she has been sexually active in the last 3 weeks, not using condoms for STD prevention.  The history is provided by the patient.   Past Medical History:  Diagnosis Date   Anemia    iron    History of preterm delivery, currently pregnant 08/08/2018   She used 17 P with last pregnancy and will do it again with this one.   History of preterm delivery, currently pregnant, unspecified trimester 04/04/2017    too late for [facility], started prometrium   No pertinent past medical history    Preterm delivery    Stillbirth    Supervision of high risk pregnancy, antepartum 08/08/2018    Nursing Staff Provider Office Location  [facility] Dating   Language   Egnlis Anatomy US  scheduled Flu Vaccine   Genetic Screen  NIPS: low risk female  TDaP vaccine    Hgb A1C or  GTT  Third trimester  Rhogam   n/a   LAB RESULTS  Feeding Plan Breast Blood Type --/--/AB POS, AB POS Performed at [facility], [address], [place], [place] [address]  [phone]) AB+ Contraception  BTL Antib   UTI (lower urinary tract infection)    Vaginal wall cyst 01/02/2015   See photo in note from 01/01/15    Patient Active Problem List   Diagnosis Date Noted   UTI (urinary tract infection) during  pregnancy, third trimester 11/24/2018   Anemia 07/06/2017   Past Surgical History:  Procedure Laterality Date   NO PAST SURGERIES     OB History     Gravida  6   Para  4   Term  1   Preterm  3   AB  1   Living  4      SAB  1   IAB  0   Ectopic  0   Multiple  0   Live Births  4          Home Medications    Prior to Admission medications   Not on File    Family History Family History  Problem Relation Age of Onset   Hypertension Mother    Social History Social History   Tobacco Use   Smoking status: Never   Smokeless tobacco: Never  Vaping Use   Vaping Use: Never used  Substance Use Topics   Alcohol use: No   Drug use: No   Allergies   Cherry  Review of Systems Review of Systems Pertinent findings revealed after performing a 14 point review of systems has been noted in the history of present illness.  Physical Exam Triage Vital Signs ED Triage Vitals  Enc Vitals Group     BP 01/02/21  0827 (!) 147/82     Pulse Rate 01/02/21 0827 72     Resp 01/02/21 0827 18     Temp 01/02/21 0827 98.3 F (36.8 C)     Temp Source 01/02/21 0827 Oral     SpO2 01/02/21 0827 98 %     Weight --      Height --      Head Circumference --      Peak Flow --      Pain Score 01/02/21 0826 5     Pain Loc --      Pain Edu? --      Excl. in GC? --   No data found.  Updated Vital Signs BP 103/69 (BP Location: Left Arm)   Pulse 74   Temp (!) 97.5 F (36.4 C) (Oral)   Resp 14   LMP  (LMP Unknown) Comment: Depo birth control; periods irregular  SpO2 99%   Physical Exam Vitals and nursing note reviewed.  Constitutional:      General: She is not in acute distress.    Appearance: Normal appearance. She is not ill-appearing.  HENT:     Head: Normocephalic and atraumatic.  Eyes:     General: Lids are normal.        Right eye: No discharge.        Left eye: No discharge.     Extraocular Movements: Extraocular movements intact.     Conjunctiva/sclera:  Conjunctivae normal.     Right eye: Right conjunctiva is not injected.     Left eye: Left conjunctiva is not injected.  Neck:     Trachea: Trachea and phonation normal.  Cardiovascular:     Rate and Rhythm: Normal rate and regular rhythm.     Pulses: Normal pulses.     Heart sounds: Normal heart sounds. No murmur heard.    No friction rub. No gallop.  Pulmonary:     Effort: Pulmonary effort is normal. No accessory muscle usage, prolonged expiration or respiratory distress.     Breath sounds: Normal breath sounds. No stridor, decreased air movement or transmitted upper airway sounds. No decreased breath sounds, wheezing, rhonchi or rales.  Chest:     Chest wall: No tenderness.  Genitourinary:    Comments: Patient politely declines pelvic exam today, patient provided a vaginal swab for testing. Musculoskeletal:        General: Normal range of motion.     Cervical back: Normal range of motion and neck supple. Normal range of motion.  Lymphadenopathy:     Cervical: No cervical adenopathy.  Skin:    General: Skin is warm and dry.     Findings: No erythema or rash.  Neurological:     General: No focal deficit present.     Mental Status: She is alert and oriented to person, place, and time.  Psychiatric:        Mood and Affect: Mood normal.        Behavior: Behavior normal.     Visual Acuity Right Eye Distance:   Left Eye Distance:   Bilateral Distance:    Right Eye Near:   Left Eye Near:    Bilateral Near:     UC Couse / Diagnostics / Procedures:     Radiology No results found.  Procedures Procedures (including critical care time) EKG  Pending results:  Labs Reviewed  POCT URINALYSIS DIP (MANUAL ENTRY) - Abnormal; Notable for the following components:      Result Value  Clarity, UA cloudy (*)    Ketones, POC UA trace (5) (*)    Blood, UA trace-intact (*)    Protein Ur, POC =30 (*)    Urobilinogen, UA 4.0 (*)    Nitrite, UA Positive (*)    Leukocytes, UA Large  (3+) (*)    All other components within normal limits  POCT URINE PREGNANCY - Abnormal; Notable for the following components:   Preg Test, Ur Positive (*)    All other components within normal limits  CERVICOVAGINAL ANCILLARY ONLY - Abnormal; Notable for the following components:   Chlamydia Positive (*)    Trichomonas Positive (*)    Bacterial Vaginitis (gardnerella) Positive (*)    All other components within normal limits    Medications Ordered in UC: Medications - No data to display  UC Diagnoses / Final Clinical Impressions(s)   I have reviewed the triage vital signs and the nursing notes.  Pertinent labs & imaging results that were available during my care of the patient were reviewed by me and considered in my medical decision making (see chart for details).    Final diagnoses:  Problematic vaginal discharge  Screening examination for STD (sexually transmitted disease)  Less than [redacted] weeks gestation of pregnancy  Acute urinary tract infection    Patient was advised to abstain from sexual intercourse for the next 7 days while being treated.  Patient was also advised to use condoms to protect themselves from STD exposure. Urine pregnancy test was positive.  Patient was advised to begin prenatal vitamins at this time.  Urine dip today was abnormal.  Urine culture not indicated due to presence of nitrites.  Augmentin prescribed for patient. Patient was advised to begin antibiotics now due to findings on urine dip and due to current pregnancy. Patient advised that they will be contacted with results of the urine culture and that treatment will be provided as indicated based on results. Return precautions advised.  ED Prescriptions     Medication Sig Dispense Auth. Provider   amoxicillin-clavulanate (AUGMENTIN) 875-125 MG tablet Take 1 tablet by mouth 2 (two) times daily for 5 days. 10 tablet Theadora Rama Scales, PA-C      PDMP not reviewed this encounter.  Disposition  Upon Discharge:  Condition: stable for discharge home  Patient presented with concern for an acute illness with associated systemic symptoms and significant discomfort requiring urgent management. In my opinion, this is a condition that a prudent lay person (someone who possesses an average knowledge of health and medicine) may potentially expect to result in complications if not addressed urgently such as respiratory distress, impairment of bodily function or dysfunction of bodily organs.   As such, the patient has been evaluated and assessed, work-up was performed and treatment was provided in alignment with urgent care protocols and evidence based medicine.  Patient/parent/caregiver has been advised that the patient may require follow up for further testing and/or treatment if the symptoms continue in spite of treatment, as clinically indicated and appropriate.  Routine symptom specific, illness specific and/or disease specific instructions were discussed with the patient and/or caregiver at length.  Prevention strategies for avoiding STD exposure were also discussed.  The patient will follow up with their current PCP if and as advised. If the patient does not currently have a PCP we will assist them in obtaining one.   The patient may need specialty follow up if the symptoms continue, in spite of conservative treatment and management, for further workup, evaluation, consultation and  treatment as clinically indicated and appropriate.  Patient/parent/caregiver verbalized understanding and agreement of plan as discussed.  All questions were addressed during visit.  Please see discharge instructions below for further details of plan.  Discharge Instructions:   Discharge Instructions      Your urine pregnancy test today is positive.    The urinalysis we performed today was also abnormal concerning for acute urinary tract infection.  Please begin Augmentin 1 tablet twice daily for the next 5  days.  We will perform a urine culture using the sample that you provided today, we will contact you with the results of the culture once it is complete which should be in the next 3 to 5 days.  If your antibiotic needs to be adjusted, we will provide a new prescription for you.  Please know that Augmentin is considered safe to take during the early stages of pregnancy and should not affect your baby..   The results of your vaginal swab test which screens for BV, yeast, gonorrhea, chlamydia and trichomonas will be made available to you once it is complete.  This typically takes 3 to 5 days.  Please note that we do not test for herpes virus unless you are having an active lesion concerning for herpes outbreak.  Please abstain from sexual intercourse of any kind, vaginal, oral or anal, until you have received the results of your STD testing.     Please remember that your body is a [place] and you are the Goddess.  The only way to prevent transmission of sexually transmitted disease when having sexual intercourse is to use condoms.  Repeat sexually transmitted infections can cause scarring in your fallopian tubes which will interfere with your ability to have children.  Repeat exposures to sexually transmitted diseases can also increase your risk of contracting HPV, the human papilloma virus which causes cervical cancer and genital warts and HIV.   If you have not had complete resolution of your symptoms after completing treatment, please return for repeat evaluation.   Thank you for visiting urgent care today.  I appreciate the opportunity to participate in your care.     This office note has been dictated using Teaching laboratory technician.  Unfortunately, this method of dictation can sometimes lead to typographical or grammatical errors.  I apologize for your inconvenience in advance if this occurs.  Please do not hesitate to reach out to me if clarification is needed.       Theadora Rama  Scales, [place] 09/18/21 [phone]

## 2021-09-16 NOTE — Discharge Instructions (Addendum)
Your urine pregnancy test today is positive.    The urinalysis we performed today was also abnormal concerning for acute urinary tract infection.  Please begin Augmentin 1 tablet twice daily for the next 5 days.  We will perform a urine culture using the sample that you provided today, we will contact you with the results of the culture once it is complete which should be in the next 3 to 5 days.  If your antibiotic needs to be adjusted, we will provide a new prescription for you.  Please know that Augmentin is considered safe to take during the early stages of pregnancy and should not affect your baby..   The results of your vaginal swab test which screens for BV, yeast, gonorrhea, chlamydia and trichomonas will be made available to you once it is complete.  This typically takes 3 to 5 days.  Please note that we do not test for herpes virus unless you are having an active lesion concerning for herpes outbreak.  Please abstain from sexual intercourse of any kind, vaginal, oral or anal, until you have received the results of your STD testing.     Please remember that your body is a [name] and you are the Goddess.  The only way to prevent transmission of sexually transmitted disease when having sexual intercourse is to use condoms.  Repeat sexually transmitted infections can cause scarring in your fallopian tubes which will interfere with your ability to have children.  Repeat exposures to sexually transmitted diseases can also increase your risk of contracting HPV, the human papilloma virus which causes cervical cancer and genital warts and HIV.   If you have not had complete resolution of your symptoms after completing treatment, please return for repeat evaluation.   Thank you for visiting urgent care today.  I appreciate the opportunity to participate in your care.

## 2021-09-16 NOTE — ED Triage Notes (Signed)
Pt. Stated she started having abnormal vaginal discharge 2 days ago; dark yellow in color; potent smell; no pain.

## 2021-09-17 ENCOUNTER — Telehealth (HOSPITAL_COMMUNITY): Payer: Self-pay | Admitting: Emergency Medicine

## 2021-09-17 LAB — CERVICOVAGINAL ANCILLARY ONLY
Bacterial Vaginitis (gardnerella): POSITIVE — AB
Candida Glabrata: NEGATIVE
Candida Vaginitis: NEGATIVE
Chlamydia: POSITIVE — AB
Comment: NEGATIVE
Comment: NEGATIVE
Comment: NEGATIVE
Comment: NEGATIVE
Comment: NEGATIVE
Comment: NORMAL
Neisseria Gonorrhea: NEGATIVE
Trichomonas: POSITIVE — AB

## 2021-09-17 MED ORDER — AZITHROMYCIN 250 MG PO TABS: 1000.0000 mg | ORAL_TABLET | Freq: Once | ORAL | 0 refills | Status: AC

## 2021-09-17 MED ORDER — METRONIDAZOLE 500 MG PO TABS: 500.0000 mg | ORAL_TABLET | Freq: Two times a day (BID) | ORAL | 0 refills | Status: DC

## 2021-09-18 ENCOUNTER — Telehealth: Payer: Self-pay | Admitting: Emergency Medicine

## 2021-09-18 MED ORDER — AMOXICILLIN-POT CLAVULANATE 875-125 MG PO TABS: 1.0000 | ORAL_TABLET | Freq: Two times a day (BID) | ORAL | 0 refills | Status: AC

## 2021-09-18 NOTE — Telephone Encounter (Signed)
Patient had nitrites in her urine during visit, Augmentin sent to treat for acute urinary tract infection given that patient is pregnant.  Patient also tested positive for BV, trichomonas and chlamydia.  Patient was provided with prescriptions for metronidazole and azithromycin.

## 2021-09-29 ENCOUNTER — Other Ambulatory Visit: Payer: Self-pay

## 2021-09-29 ENCOUNTER — Other Ambulatory Visit: Payer: Self-pay | Admitting: Obstetrics & Gynecology

## 2021-09-29 ENCOUNTER — Other Ambulatory Visit (INDEPENDENT_AMBULATORY_CARE_PROVIDER_SITE_OTHER): Payer: Medicaid Other

## 2021-09-29 ENCOUNTER — Ambulatory Visit (INDEPENDENT_AMBULATORY_CARE_PROVIDER_SITE_OTHER): Payer: Medicaid Other

## 2021-09-29 VITALS — BP 101/65 | HR 75 | Temp 97.6°F

## 2021-09-29 DIAGNOSIS — Z3A01 Less than 8 weeks gestation of pregnancy: Secondary | ICD-10-CM

## 2021-09-29 DIAGNOSIS — Z3490 Encounter for supervision of normal pregnancy, unspecified, unspecified trimester: Secondary | ICD-10-CM

## 2021-09-29 DIAGNOSIS — O3680X0 Pregnancy with inconclusive fetal viability, not applicable or unspecified: Secondary | ICD-10-CM | POA: Diagnosis not present

## 2021-09-29 DIAGNOSIS — Z3491 Encounter for supervision of normal pregnancy, unspecified, first trimester: Secondary | ICD-10-CM | POA: Diagnosis not present

## 2021-09-29 MED ORDER — PROMETHAZINE HCL 25 MG PO TABS: 25.0000 mg | ORAL_TABLET | Freq: Four times a day (QID) | ORAL | 1 refills | Status: DC | PRN

## 2021-09-29 MED ORDER — METOCLOPRAMIDE HCL 10 MG PO TABS: 10.0000 mg | ORAL_TABLET | Freq: Four times a day (QID) | ORAL | 1 refills | Status: DC

## 2021-09-29 NOTE — Patient Instructions (Signed)
Prenatal Care Providers           [facility]  [address] [phone]  [facility]   [address]  [phone]  [facility] [place]       [address] [phone]            [facility] [place]     [address] [phone]          [facility] [place]   [address] [phone]  [facility]  [address] [phone]     [facility])  [address]   [phone]     [facility]  Phone: [phone]  [facility]  Phone: [phone]  [facility] Phone: [phone]  [facility] Phone: [phone]  [facility] Phone: [phone]  [facility] Phone: [phone]  [facility]  Phone: [phone] Safe Medications in Pregnancy   Acne: Benzoyl Peroxide Salicylic Acid  Backache/Headache: Tylenol: 2 regular strength every 4 hours OR              2 Extra strength every 6 hours  Colds/Coughs/Allergies: Benadryl (alcohol free) 25 mg every 6 hours as needed Breath right strips Claritin Cepacol throat lozenges Chloraseptic throat spray Cold-Eeze- up to three times per day Cough drops, alcohol free Flonase (by prescription only) Guaifenesin Mucinex Robitussin DM (plain only, alcohol free) Saline nasal spray/drops Sudafed (pseudoephedrine) & Actifed ** use only after [redacted] weeks gestation and if you do not have high blood pressure Tylenol Vicks Vaporub Zinc lozenges Zyrtec   Constipation: Colace Ducolax suppositories Fleet enema Glycerin suppositories Metamucil Milk of magnesia Miralax Senokot Smooth move tea  Diarrhea: Kaopectate Imodium A-D  *NO pepto  Bismol  Hemorrhoids: Anusol Anusol HC Preparation H Tucks  Indigestion: Tums Maalox Mylanta Zantac  Pepcid  Insomnia: Benadryl (alcohol free) 25mg every 6 hours as needed Tylenol PM Unisom, no Gelcaps  Leg Cramps: Tums MagGel  Nausea/Vomiting:  Bonine Dramamine Emetrol Ginger extract Sea bands Meclizine  Nausea medication to take during pregnancy:  Unisom (doxylamine succinate 25 mg tablets) Take one tablet daily at bedtime. If symptoms are not adequately controlled, the dose can be increased to a maximum recommended dose of two tablets daily (1/2 tablet in the morning, 1/2 tablet mid-afternoon and one at bedtime). Vitamin B6 100mg tablets. Take one tablet twice a day (up to 200 mg per day).  Skin Rashes: Aveeno products Benadryl cream or 25mg every 6 hours as needed Calamine Lotion 1% cortisone cream  Yeast infection: Gyne-lotrimin 7 Monistat 7   **If taking multiple medications, please check labels to avoid duplicating the same active ingredients **take medication as directed on the label ** Do not exceed 4000 mg of tylenol in 24 hours **Do not take medications that contain aspirin or ibuprofen    

## 2021-09-29 NOTE — Progress Notes (Signed)
GYNECOLOGY OFFICE VISIT NOTE  History:   [name] is a 31 y.o. [phone] here today for ultrasound results.  Ultrasound shows live IUP measuring 6 weeks 4 days with FHR 112 bpm   Abdominal pain: No   Vaginal bleeding: No    OB History  Gravida Para Term Preterm AB Living  7 4 1 3 1 4   SAB IAB Ectopic Multiple Live Births  1 0 0 0 4    # Outcome Date GA Lbr Len/2nd Weight Sex Delivery Anes PTL Lv  7 Current           6 Term 07/21/17 [redacted]w[redacted]d  6 lb 12.8 oz (3.084 kg) F Vag-Spont None Y LIV  5 Preterm 02/26/15 [redacted]w[redacted]d 11:50 / 00:12 5 lb 6.6 oz (2.455 kg) F Vag-Spont None Y LIV  4 Preterm 09/11/13 [redacted]w[redacted]d 185:01 / 00:04 4 lb 5.8 oz (1.98 kg) M Vag-Spont None Y LIV  3 Preterm 11/24/12 [redacted]w[redacted]d 04:55 / 00:45 3 lb 15.9 oz (1.81 kg) F Vag-Spont None Y LIV  2 Gravida           1 SAB  [redacted]w[redacted]d      N      Birth Comments: PPROM at 19 weeks 3 days; ??incomop cervix-did not labor at home, came in with membranes ruptured.     Health Maintenance Due  Topic Date Due   Hepatitis C Screening  Never done   PAP-Cervical Cytology Screening  08/15/2021   PAP SMEAR-Modifier  08/15/2021    Past Medical History:  Diagnosis Date   Anemia    iron    History of preterm delivery, currently pregnant 08/08/2018   She used 17 P with last pregnancy and will do it again with this one.   History of preterm delivery, currently pregnant, unspecified trimester 04/04/2017    too late for [facility], started prometrium   No pertinent past medical history    Preterm delivery    Stillbirth    Supervision of high risk pregnancy, antepartum 08/08/2018    Nursing Staff Provider Office Location  [facility] Dating   Language   Egnlis Anatomy [facility]  scheduled Flu Vaccine   Genetic Screen  NIPS: low risk female  TDaP vaccine    Hgb A1C or  GTT  Third trimester  Rhogam   n/a   LAB RESULTS  Feeding Plan Breast Blood Type --/--/AB POS, AB POS Performed at [facility], [address] [place], [place]  [phone]) AB+  Contraception  BTL Antib   UTI (lower urinary tract infection)    Vaginal wall cyst 01/02/2015   See photo in note from 01/01/15     Past Surgical History:  Procedure Laterality Date   NO PAST SURGERIES      The following portions of the patient's history were reviewed and updated as appropriate: allergies, current medications, past family history, past medical history, past social history, past surgical history and problem list.   Review of Systems:  Pertinent items noted in HPI and remainder of comprehensive ROS otherwise negative.  Physical Exam:  BP 101/65   Pulse 75   Temp 97.6 F (36.4 C)   LMP  (LMP Unknown) Comment: Depo birth control; periods irregular CONSTITUTIONAL: Well-developed, well-nourished female in no acute distress.  HEENT:  Normocephalic, atraumatic. External right and left ear normal. No scleral icterus.  NECK: Normal range of motion, supple, no masses noted on observation SKIN: No rash noted. Not diaphoretic. No erythema. No pallor. MUSCULOSKELETAL:  Normal range of motion. No edema noted. NEUROLOGIC: Alert and oriented to person, place, and time. Normal muscle tone coordination.  PSYCHIATRIC: Normal mood and affect. Normal behavior. Normal judgment and thought content. RESPIRATORY: Effort normal, no problems with respiration noted  Assessment and Plan:  Viable Pregnancy - Z34.90  Reviewed results with patient that show a viable intrauterine pregnancy at 6 weeks of gestation. Recommended she contact providers to start prenatal care, and she was given a list of options in her AVS. Prescription sent for prenatal vitamins. Reviewed ED precautions including vaginal bleeding like a period or heavier, severe abdominal pain, and fever. All questions answered.   Please refer to After Visit Summary for other counseling recommendations.   Patient reports intermittent nausea. Reglan and phenergan sent to pharmacy.   Patient to establish prenatal care as soon as  possible list given  Total face-to-face time with patient: 20 minutes.  Over 50% of encounter was spent on counseling and coordination of care.  Rolm Bookbinder, CNM [facility] [employer], [facility]

## 2021-10-20 ENCOUNTER — Telehealth: Payer: Medicaid Other

## 2021-11-11 ENCOUNTER — Ambulatory Visit: Payer: Medicaid Other | Admitting: Certified Nurse Midwife

## 2021-12-07 ENCOUNTER — Ambulatory Visit (INDEPENDENT_AMBULATORY_CARE_PROVIDER_SITE_OTHER): Payer: Medicaid Other | Admitting: Family Medicine

## 2021-12-07 ENCOUNTER — Other Ambulatory Visit: Payer: Self-pay

## 2021-12-07 ENCOUNTER — Other Ambulatory Visit (HOSPITAL_COMMUNITY)
Admission: RE | Admit: 2021-12-07 | Discharge: 2021-12-07 | Disposition: A | Discharge status: Home / Self Care | Payer: Medicaid Other | Source: Ambulatory Visit

## 2021-12-07 ENCOUNTER — Encounter: Payer: Self-pay | Admitting: Family Medicine

## 2021-12-07 VITALS — BP 101/64 | HR 69 | Wt 109.2 lb

## 2021-12-07 DIAGNOSIS — Z3490 Encounter for supervision of normal pregnancy, unspecified, unspecified trimester: Secondary | ICD-10-CM

## 2021-12-07 DIAGNOSIS — Z23 Encounter for immunization: Secondary | ICD-10-CM | POA: Diagnosis not present

## 2021-12-07 DIAGNOSIS — Z8751 Personal history of pre-term labor: Secondary | ICD-10-CM

## 2021-12-07 LAB — POCT URINALYSIS DIP (DEVICE)
Bilirubin Urine: NEGATIVE
Glucose, UA: NEGATIVE mg/dL
Hgb urine dipstick: NEGATIVE
Ketones, ur: NEGATIVE mg/dL
Leukocytes,Ua: NEGATIVE
Nitrite: NEGATIVE
Protein, ur: NEGATIVE mg/dL
Specific Gravity, Urine: 1.02 (ref 1.005–1.030)
Urobilinogen, UA: 0.2 mg/dL (ref 0.0–1.0)
pH: 6.5 (ref 5.0–8.0)

## 2021-12-07 LAB — OB RESULTS CONSOLE HEPATITIS B SURFACE ANTIGEN: Hepatitis B Surface Ag: NEGATIVE

## 2021-12-07 LAB — OB RESULTS CONSOLE RUBELLA ANTIBODY, IGM: Rubella: IMMUNE

## 2021-12-07 LAB — HEPATITIS C ANTIBODY: HCV Ab: NEGATIVE

## 2021-12-07 MED ORDER — BLOOD PRESSURE KIT DEVI: 1.0000 | Freq: Once | 0 refills | Status: AC

## 2021-12-07 NOTE — Progress Notes (Signed)
PRENATAL VISIT NOTE  Subjective:  [name] is a 31 y.o. [id number] at 47w3dbeing seen today for her first prenatal visit for this pregnancy.  She is currently monitored for the following issues for this low-risk pregnancy and has Anemia; Supervision of low-risk pregnancy; and History of pre-term labor on their problem list.  Patient reports no complaints.  Contractions: Not present. Vag. Bleeding: None.  Movement: Absent. Denies leaking of fluid.   She is planning to breastfeed. Desires DEPO for contraception.   The following portions of the patient's history were reviewed and updated as appropriate: allergies, current medications, past family history, past medical history, past social history, past surgical history and problem list.   Objective:   Vitals:   12/07/21 0922  BP: 101/64  Pulse: 69  Weight: 109 lb 3.2 oz (49.5 kg)    Fetal Status: Fetal Heart Rate (bpm): 153   Movement: Absent     General:  Alert, oriented and cooperative. Patient is in no acute distress.  Skin: Skin is warm and dry. No rash noted.   Cardiovascular: Normal heart rate and rhythm noted  Respiratory: Normal respiratory effort, no problems with respiration noted. Clear to auscultation.   Abdomen: Soft, gravid, appropriate for gestational age. Normal bowel sounds. Non-tender. Pain/Pressure: Absent     Pelvic: Cervical exam performed       Normal cervical contour, no lesions, no bleeding following pap, normal discharge  Extremities: Normal range of motion.  Edema: None  Mental Status: Normal mood and affect. Normal behavior. Normal judgment and thought content.    Indications for ASA therapy (per uptodate) One of the following: Previous pregnancy with preeclampsia, especially early onset and with an adverse outcome No Multifetal gestation No Chronic hypertension No Type 1 or 2 diabetes mellitus No Chronic kidney disease No Autoimmune disease (antiphospholipid syndrome, systemic lupus  erythematosus) No  Two or more of the following: Nulliparity No Obesity (body mass index >30 kg/m2) No Family history of preeclampsia in mother or sister No Age ?35 years No Sociodemographic characteristics (African American race, low socioeconomic level) Yes Personal risk factors (eg, previous pregnancy with low birth weight or small for gestational age infant, previous adverse pregnancy outcome [eg, stillbirth], interval >10 years between pregnancies) No  Indications for early GDM screening  First-degree relative with diabetes No BMI >30kg/m2 No Age > 35 No Previous birth of an infant weighing ?4000 g No Gestational diabetes mellitus in a previous pregnancy No Glycated hemoglobin ?5.7 percent (39 mmol/mol), impaired glucose tolerance, or impaired fasting glucose on previous testing No High-risk race/ethnicity (eg, African American, Latino, Native American, [place], Pacific Islander) No Previous stillbirth of unknown cause No Maternal birthweight > 9 lbs No History of cardiovascular disease No Hypertension or on therapy for hypertension No High-density lipoprotein cholesterol level <35 mg/dL (0.90 mmol/L) and/or a triglyceride level >250 mg/dL (2.82 mmol/L) No Polycystic ovary syndrome No Physical inactivity No Other clinical condition associated with insulin resistance (eg, severe obesity, acanthosis nigricans) No Current use of glucocorticoids No   Early screening tests: FBS, A1C, Random CBG, glucose challenge  Assessment and Plan:  Pregnancy: [id number] 169w3d1. Encounter for supervision of low-risk pregnancy, antepartum - CHL AMB BABYSCRIPTS SCHEDULE OPTIMIZATION - Flu Vaccine QUAD 36+ mos IM (Fluarix, Quad PF) - Blood Pressure Monitoring (BLOOD PRESSURE KIT) DEVI; 1 Device by Does not apply route once for 1 dose.  Dispense: 1 each; Refill: 0 - CBC/D/Plt+RPR+Rh+ABO+RubIgG... - HgB A1c - Cytology - PAP( [place]) -  Panorama Prenatal Test Full Panel - Culture,  OB Urine - AFP, Serum, Open Spina Bifida - [place] MFM OB DETAIL +14 WK; Future  2. History of preterm delivery - [place] MFM OB DETAIL +14 WK; Future  2/2 SOL x3  Preterm labor/first trimester warning symptoms and general obstetric precautions including but not limited to vaginal bleeding, contractions, leaking of fluid and fetal movement were reviewed in detail with the patient. Please refer to After Visit Summary for other counseling recommendations.   Discussed the normal visit cadence for prenatal care Discussed the nature of our practice with multiple providers including residents and students   Return in about 4 weeks (around 01/04/2022) for [place].  Will sch Anatomy scan with CL in the next 4 weeks.  [name], [place] Fellow, Faculty practice [place] for [facility] 12/07/21  11:21 AM

## 2021-12-08 LAB — CBC/D/PLT+RPR+RH+ABO+RUBIGG...
Antibody Screen: NEGATIVE
Basophils Absolute: 0 10*3/uL (ref 0.0–0.2)
Basos: 1 %
EOS (ABSOLUTE): 0.2 10*3/uL (ref 0.0–0.4)
Eos: 3 %
HCV Ab: NONREACTIVE
HIV Screen 4th Generation wRfx: NONREACTIVE
Hematocrit: 38.1 % (ref 34.0–46.6)
Hemoglobin: 12.6 g/dL (ref 11.1–15.9)
Hepatitis B Surface Ag: NEGATIVE
Immature Grans (Abs): 0.1 10*3/uL (ref 0.0–0.1)
Immature Granulocytes: 1 %
Lymphocytes Absolute: 1.6 10*3/uL (ref 0.7–3.1)
Lymphs: 19 %
MCH: 28.5 pg (ref 26.6–33.0)
MCHC: 33.1 g/dL (ref 31.5–35.7)
MCV: 86 fL (ref 79–97)
Monocytes Absolute: 0.5 10*3/uL (ref 0.1–0.9)
Monocytes: 6 %
Neutrophils Absolute: 5.8 10*3/uL (ref 1.4–7.0)
Neutrophils: 70 %
Platelets: 236 10*3/uL (ref 150–450)
RBC: 4.42 x10E6/uL (ref 3.77–5.28)
RDW: 13 % (ref 11.7–15.4)
RPR Ser Ql: NONREACTIVE
Rh Factor: POSITIVE
Rubella Antibodies, IGG: 1.26 index (ref >0.99)
WBC: 8.3 10*3/uL (ref 3.4–10.8)

## 2021-12-08 LAB — AFP, SERUM, OPEN SPINA BIFIDA
AFP MoM: 1.38
AFP Value: 64.1 ng/mL
Gest. Age on Collection Date: 16 weeks
Maternal Age At EDD: 29.6 yr
OSBR Risk 1 IN: 7620
Test Results:: NEGATIVE
Weight: 109 [lb_av]

## 2021-12-08 LAB — HEMOGLOBIN A1C
Est. average glucose Bld gHb Est-mCnc: 100 mg/dL
Hgb A1c MFr Bld: 5.1 % (ref 4.8–5.6)

## 2021-12-08 LAB — HCV INTERPRETATION

## 2021-12-09 LAB — CYTOLOGY - PAP
Chlamydia: NEGATIVE
Comment: NEGATIVE
Comment: NEGATIVE
Comment: NORMAL
Diagnosis: NEGATIVE
Neisseria Gonorrhea: NEGATIVE
Trichomonas: NEGATIVE

## 2021-12-09 LAB — CULTURE, OB URINE

## 2021-12-09 LAB — URINE CULTURE, OB REFLEX

## 2021-12-12 LAB — PANORAMA PRENATAL TEST FULL PANEL:PANORAMA TEST PLUS 5 ADDITIONAL MICRODELETIONS: FETAL FRACTION: 9.4

## 2021-12-14 ENCOUNTER — Encounter: Payer: Self-pay | Admitting: *Deleted

## 2021-12-25 ENCOUNTER — Ambulatory Visit: Payer: Medicaid Other

## 2021-12-25 ENCOUNTER — Ambulatory Visit: Payer: Medicaid Other | Attending: Family Medicine | Admitting: *Deleted

## 2021-12-25 ENCOUNTER — Ambulatory Visit (HOSPITAL_BASED_OUTPATIENT_CLINIC_OR_DEPARTMENT_OTHER): Payer: Medicaid Other

## 2021-12-25 VITALS — BP 101/54 | HR 72

## 2021-12-25 DIAGNOSIS — Z3490 Encounter for supervision of normal pregnancy, unspecified, unspecified trimester: Secondary | ICD-10-CM

## 2021-12-25 DIAGNOSIS — Z8751 Personal history of pre-term labor: Secondary | ICD-10-CM | POA: Diagnosis not present

## 2021-12-25 DIAGNOSIS — O09892 Supervision of other high risk pregnancies, second trimester: Secondary | ICD-10-CM

## 2021-12-25 DIAGNOSIS — O99012 Anemia complicating pregnancy, second trimester: Secondary | ICD-10-CM | POA: Insufficient documentation

## 2021-12-25 DIAGNOSIS — O09212 Supervision of pregnancy with history of pre-term labor, second trimester: Secondary | ICD-10-CM | POA: Insufficient documentation

## 2021-12-25 DIAGNOSIS — O09292 Supervision of pregnancy with other poor reproductive or obstetric history, second trimester: Secondary | ICD-10-CM | POA: Insufficient documentation

## 2021-12-25 DIAGNOSIS — Z363 Encounter for antenatal screening for malformations: Secondary | ICD-10-CM | POA: Insufficient documentation

## 2021-12-25 DIAGNOSIS — Z3A19 19 weeks gestation of pregnancy: Secondary | ICD-10-CM | POA: Insufficient documentation

## 2021-12-28 ENCOUNTER — Other Ambulatory Visit: Payer: Self-pay | Admitting: *Deleted

## 2021-12-28 DIAGNOSIS — Z362 Encounter for other antenatal screening follow-up: Secondary | ICD-10-CM

## 2022-01-04 ENCOUNTER — Other Ambulatory Visit: Payer: Self-pay

## 2022-01-04 ENCOUNTER — Ambulatory Visit (INDEPENDENT_AMBULATORY_CARE_PROVIDER_SITE_OTHER): Payer: Medicaid Other | Admitting: Student

## 2022-01-04 VITALS — BP 110/62 | HR 77 | Wt 112.0 lb

## 2022-01-04 DIAGNOSIS — Z3492 Encounter for supervision of normal pregnancy, unspecified, second trimester: Secondary | ICD-10-CM

## 2022-01-04 DIAGNOSIS — Z3A20 20 weeks gestation of pregnancy: Secondary | ICD-10-CM

## 2022-01-04 NOTE — Progress Notes (Signed)
   PRENATAL VISIT NOTE  Subjective:  [name] is a 31 y.o. [id number] at [redacted]w[redacted]d being seen today for ongoing prenatal care.  She is currently monitored for the following issues for this high-risk pregnancy and has Supervision of low-risk pregnancy and History of pre-term labor on their problem list.  Patient reports no complaints.  Contractions: Not present. Vag. Bleeding: None.  Movement: Present. Denies leaking of fluid.   The following portions of the patient's history were reviewed and updated as appropriate: allergies, current medications, past family history, past medical history, past social history, past surgical history and problem list.   Objective:   Vitals:   01/04/22 1043  BP: 110/62  Pulse: 77  Weight: 112 lb (50.8 kg)    Fetal Status: Fetal Heart Rate (bpm): 147   Movement: Present   Fundal height at umbilicus  General:  Alert, oriented and cooperative. Patient is in no acute distress.  Skin: Skin is warm and dry. No rash noted.   Cardiovascular: Normal heart rate noted  Respiratory: Normal respiratory effort, no problems with respiration noted  Abdomen: Soft, gravid, appropriate for gestational age.  Pain/Pressure: Present     Pelvic: Cervical exam deferred        Extremities: Normal range of motion.  Edema: None  Mental Status: Normal mood and affect. Normal behavior. Normal judgment and thought content.   Assessment and Plan:  Pregnancy: [id number] at [redacted]w[redacted]d 1. Encounter for supervision of low-risk pregnancy in second trimester -Doing well with no complaints. Hx of PTD x 3. Last 2 deliveries were at term. Reviewed anatomy scan - normal cervical length. Has f/u in 2 weeks to complete anatomy & recheck cervical length  2. [redacted] weeks gestation of pregnancy   Preterm labor symptoms and general obstetric precautions including but not limited to vaginal bleeding, contractions, leaking of fluid and fetal movement were reviewed in detail with the patient. Please refer to  After Visit Summary for other counseling recommendations.   Return in about 4 weeks (around 02/01/2022) for Routine OB.  Future Appointments  Date Time Provider [place]  01/20/2022  1:30 PM [facility] NURSE [facility]  01/20/2022  1:45 PM [facility] US6 [facility]  02/01/2022 10:35 AM Ndulue, Lyndel Safe, MD [facility]    Jorje Guild, NP

## 2022-01-20 ENCOUNTER — Ambulatory Visit: Payer: Medicaid Other | Attending: Obstetrics

## 2022-01-20 ENCOUNTER — Ambulatory Visit: Payer: Medicaid Other | Admitting: *Deleted

## 2022-01-20 VITALS — BP 107/63 | HR 79

## 2022-01-20 DIAGNOSIS — O09292 Supervision of pregnancy with other poor reproductive or obstetric history, second trimester: Secondary | ICD-10-CM

## 2022-01-20 DIAGNOSIS — O09892 Supervision of other high risk pregnancies, second trimester: Secondary | ICD-10-CM | POA: Diagnosis present

## 2022-01-20 DIAGNOSIS — O99012 Anemia complicating pregnancy, second trimester: Secondary | ICD-10-CM

## 2022-01-20 DIAGNOSIS — D649 Anemia, unspecified: Secondary | ICD-10-CM | POA: Diagnosis not present

## 2022-01-20 DIAGNOSIS — Z3A22 22 weeks gestation of pregnancy: Secondary | ICD-10-CM

## 2022-01-20 DIAGNOSIS — Z362 Encounter for other antenatal screening follow-up: Secondary | ICD-10-CM | POA: Insufficient documentation

## 2022-01-21 ENCOUNTER — Other Ambulatory Visit: Payer: Self-pay | Admitting: *Deleted

## 2022-01-21 DIAGNOSIS — Z362 Encounter for other antenatal screening follow-up: Secondary | ICD-10-CM

## 2022-02-01 ENCOUNTER — Encounter: Payer: Medicaid Other | Admitting: Obstetrics & Gynecology

## 2022-02-11 ENCOUNTER — Encounter: Payer: Self-pay | Admitting: *Deleted

## 2022-02-22 ENCOUNTER — Encounter: Payer: Self-pay | Admitting: *Deleted

## 2022-03-08 ENCOUNTER — Inpatient Hospital Stay (HOSPITAL_COMMUNITY)
Admission: AD | Admit: 2022-03-08 | Discharge: 2022-03-08 | Disposition: A | Discharge status: Home / Self Care | Payer: Medicaid Other | Attending: Obstetrics and Gynecology | Admitting: Obstetrics and Gynecology

## 2022-03-08 ENCOUNTER — Encounter (HOSPITAL_COMMUNITY): Payer: Self-pay | Admitting: Obstetrics and Gynecology

## 2022-03-08 ENCOUNTER — Other Ambulatory Visit: Payer: Self-pay

## 2022-03-08 DIAGNOSIS — Z3A29 29 weeks gestation of pregnancy: Secondary | ICD-10-CM | POA: Diagnosis not present

## 2022-03-08 DIAGNOSIS — O26893 Other specified pregnancy related conditions, third trimester: Secondary | ICD-10-CM | POA: Insufficient documentation

## 2022-03-08 DIAGNOSIS — O479 False labor, unspecified: Secondary | ICD-10-CM | POA: Diagnosis not present

## 2022-03-08 DIAGNOSIS — Z3689 Encounter for other specified antenatal screening: Secondary | ICD-10-CM

## 2022-03-08 DIAGNOSIS — M549 Dorsalgia, unspecified: Secondary | ICD-10-CM | POA: Diagnosis not present

## 2022-03-08 DIAGNOSIS — O4703 False labor before 37 completed weeks of gestation, third trimester: Secondary | ICD-10-CM | POA: Diagnosis not present

## 2022-03-08 LAB — WET PREP, GENITAL
Clue Cells Wet Prep HPF POC: NONE SEEN
Sperm: NONE SEEN
Trich, Wet Prep: NONE SEEN
WBC, Wet Prep HPF POC: >=10 — AB (ref <10)
Yeast Wet Prep HPF POC: NONE SEEN

## 2022-03-08 LAB — URINALYSIS, ROUTINE W REFLEX MICROSCOPIC
Bilirubin Urine: NEGATIVE
Glucose, UA: 50 mg/dL — AB
Hgb urine dipstick: NEGATIVE
Ketones, ur: NEGATIVE mg/dL
Nitrite: NEGATIVE
Protein, ur: 100 mg/dL — AB
Specific Gravity, Urine: 1.012 (ref 1.005–1.030)
WBC, UA: >50 WBC/hpf — ABNORMAL HIGH (ref 0–5)
pH: 6 (ref 5.0–8.0)

## 2022-03-08 LAB — FETAL FIBRONECTIN: Fetal Fibronectin: NEGATIVE

## 2022-03-08 MED ORDER — CYCLOBENZAPRINE HCL 10 MG PO TABS: 10.0000 mg | ORAL_TABLET | Freq: Two times a day (BID) | ORAL | 0 refills | Status: DC | PRN

## 2022-03-08 MED ORDER — INDOMETHACIN 25 MG PO CAPS
50.0000 mg | ORAL_CAPSULE | Freq: Once | ORAL | Status: AC
  Administered 2022-03-08: 50 mg via ORAL
  Filled 2022-03-08: qty 2

## 2022-03-08 MED ORDER — LACTATED RINGERS IV BOLUS
1000.0000 mL | Freq: Once | INTRAVENOUS | Status: AC
  Administered 2022-03-08: 1000 mL via INTRAVENOUS

## 2022-03-08 NOTE — L&D Delivery Note (Signed)
OB/GYN Faculty Practice Delivery Note  [name] is a 32 y.o. [id number] at 5w0dadmitted for IOL for IUGR.   GBS Status:  Pending - no prior h/o (+) GBS Maximum Maternal Temperature: 97.9  Labor course: Initial SVE: 4/80/-1. Augmentation with: AROM. She then progressed to complete.  ROM: 4h 311mith clear fluid  Birth: At 1347 a viable female was delivered via spontaneous vaginal delivery (Presentation: LOA). Nuchal cord present: Yes  loose, easily reduced over fetal head.  Shoulders and body delivered in usual fashion. Infant placed directly on mom's abdomen for bonding/skin-to-skin, baby dried and stimulated. Cord clamped x 2 after 1 minute and cut by CNM.  Cord blood collected.  The placenta separated spontaneously and delivered via gentle cord traction.  Pitocin infused rapidly IV per protocol.  Fundus firm with massage.  Placenta inspected and appears to be intact with a 3 VC.  Placenta/Cord with the following complications: none .  Cord pH: n/a Sponge and instrument count were correct x2.  Intrapartum complications:  IUGR Anesthesia:  local - nitrous oxide Episiotomy: none Lacerations:  none Suture Repair:  n/a EBL (mL): 13   Infant: APGAR (1 MIN): 8   APGAR (5 MINS): 8   Infant weight: pending  Mom to postpartum.  Baby to Couplet care / Skin to Skin. Placenta to L&D   Plans to Breast and bottlefeed Contraception: Depo-Provera injections Circumcision: N/A  [facility].  RoLaury Deep MSN, CNM 05/14/2022 2:36 PM

## 2022-03-08 NOTE — Discharge Instructions (Signed)

## 2022-03-08 NOTE — MAU Note (Signed)
[name] is a 32 y.o. at [redacted]w[redacted]d here in MAU reporting: pelvic pressure, back pain, and contractions since 0300. Contractions are every 4-5 minutes. No bleeding or LOF. +FM  Onset of complaint: today  Pain score: back 8/10, contractions 8/10  Vitals:   03/08/22 1038  BP: 103/61  Pulse: 92  Resp: 16  Temp: 97.7 F (36.5 C)  SpO2: 97%     FHT:163  Lab orders placed from triage: UA

## 2022-03-08 NOTE — MAU Provider Note (Signed)
History     CSN: [id number]  Arrival date and time: 03/08/22 1022   Event Date/Time   First Provider Initiated Contact with Patient 03/08/22 1105      Chief Complaint  Patient presents with   Contractions   Back Pain   HPI [name] is a 32 y.o. [id number] at [redacted]w[redacted]d who presents to MAU with chief complaint of contractions q 4-5 minutes since 0300 this morning. Pain score is 8/10 and radiates to her low back. She has not taken medication for this complaint. She denies aggravating or alleviating factors. She denies vaginal bleeding, LOF, DFM, fever or recent illness.  Patient receives care with [place].  OB History     Gravida  7   Para  5   Term  2   Preterm  3   AB  1   Living  5      SAB  1   IAB  0   Ectopic  0   Multiple  0   Live Births  5           Past Medical History:  Diagnosis Date   Anemia    iron    History of preterm delivery, currently pregnant 08/08/2018   She used 17 P with last pregnancy and will do it again with this one.   History of preterm delivery, currently pregnant, unspecified trimester 04/04/2017    too late for [facility], started prometrium   No pertinent past medical history    Preterm delivery    Stillbirth    Supervision of high risk pregnancy, antepartum 08/08/2018    Nursing Staff Provider Office Location  [place] Dating   Language   [place] Anatomy US  scheduled Flu Vaccine   Genetic Screen  NIPS: low risk female  TDaP vaccine    Hgb A1C or  GTT  Third trimester  Rhogam   n/a   LAB RESULTS  Feeding Plan Breast Blood Type --/--/AB POS, AB POS Performed at [facility], [address] [place], [place] [address]  [phone]) AB+ Contraception  BTL Antib   UTI (lower urinary tract infection)    Vaginal wall cyst 01/02/2015   See photo in note from 01/01/15     Past Surgical History:  Procedure Laterality Date   NO PAST SURGERIES      Family History  Problem Relation Age of Onset   Hypertension Mother    Cancer  Sister    Asthma Son    Diabetes Neg Hx    Heart disease Neg Hx    Stroke Neg Hx     Social History   Tobacco Use   Smoking status: Never   Smokeless tobacco: Never  Vaping Use   Vaping Use: Never used  Substance Use Topics   Alcohol use: No   Drug use: No    Allergies:  Allergies  Allergen Reactions   Cherry Hives and Itching    Medications Prior to Admission  Medication Sig Dispense Refill Last Dose   ferrous sulfate 325 (65 FE) MG tablet Take 325 mg by mouth daily with breakfast.   03/08/2022   Prenatal Vit-Fe Fumarate-FA (PRENATAL MULTIVITAMIN) TABS tablet Take 1 tablet by mouth daily at 12 noon.   03/08/2022   metoCLOPramide (REGLAN) 10 MG tablet Take 1 tablet (10 mg total) by mouth 4 (four) times daily. 30 tablet 1 More than a month   promethazine (PHENERGAN) 25 MG tablet Take 1 tablet (25 mg total) by mouth  every 6 (six) hours as needed for nausea or vomiting. 30 tablet 1 More than a month    Review of Systems  Gastrointestinal:  Positive for abdominal pain.  All other systems reviewed and are negative.  Physical Exam   Blood pressure 103/61, pulse 92, temperature 97.7 F (36.5 C), temperature source Oral, resp. rate 16, height 5\' 3"  (1.6 m), weight 54.6 kg, SpO2 97 %.  Physical Exam Vitals and nursing note reviewed. Exam conducted with a chaperone present.  Constitutional:      General: She is not in acute distress.    Appearance: Normal appearance. She is not ill-appearing.  Cardiovascular:     Rate and Rhythm: Normal rate and regular rhythm.     Pulses: Normal pulses.     Heart sounds: Normal heart sounds.  Pulmonary:     Effort: Pulmonary effort is normal.     Breath sounds: Normal breath sounds.  Abdominal:     Tenderness: There is no right CVA tenderness or left CVA tenderness.     Comments: Gravid  Genitourinary:    Comments: Pelvic exam: External genitalia normal, vaginal walls pink and well rugated, cervix visually closed. FFN collected prior to  digital exam   Skin:    Capillary Refill: Capillary refill takes less than 2 seconds.  Neurological:     Mental Status: She is alert and oriented to person, place, and time.  Psychiatric:        Mood and Affect: Mood normal.        Behavior: Behavior normal.        Thought Content: Thought content normal.        Judgment: Judgment normal.     MAU Course  Procedures  MDM  --EMR reviewed: hx spontaneous preterm labor x 2 then SVD at 36w, then 39w --Normal cervical length in current pregnancy on 01/20/2022 --Reactive tracing: baseline 140, mod var, + accels, no decels --Toco: contractions q 8-10 min --1210: CNM returned to bedside to share negative FFN result --1345: CNM returned to bedside. Pain score now 5/10. Cervix remains FT/thick/posterior   Patient Vitals for the past 24 hrs:  BP Temp Temp src Pulse Resp SpO2 Height Weight  03/08/22 1354 99/64 -- -- 82 18 -- -- --  03/08/22 1038 103/61 97.7 F (36.5 C) Oral 92 16 97 % -- --  03/08/22 1034 -- -- -- -- -- -- 5\' 3"  (1.6 m) 54.6 kg   Results for orders placed or performed during the hospital encounter of 03/08/22 (from the past 24 hour(s))  Wet prep, genital     Status: Abnormal   Collection Time: 03/08/22 11:13 AM   Specimen: Vaginal  Result Value Ref Range   Yeast Wet Prep HPF POC NONE SEEN NONE SEEN   Trich, Wet Prep NONE SEEN NONE SEEN   Clue Cells Wet Prep HPF POC NONE SEEN NONE SEEN   WBC, Wet Prep HPF POC >=10 (A) <10   Sperm NONE SEEN   Fetal fibronectin     Status: None   Collection Time: 03/08/22 11:13 AM  Result Value Ref Range   Fetal Fibronectin NEGATIVE NEGATIVE  Urinalysis, Routine w reflex microscopic Urine, Clean Catch     Status: Abnormal   Collection Time: 03/08/22 11:47 AM  Result Value Ref Range   Color, Urine YELLOW YELLOW   APPearance TURBID (A) CLEAR   Specific Gravity, Urine 1.012 1.005 - 1.030   pH 6.0 5.0 - 8.0   Glucose, UA 50 (A) NEGATIVE mg/dL  Hgb urine dipstick NEGATIVE  NEGATIVE   Bilirubin Urine NEGATIVE NEGATIVE   Ketones, ur NEGATIVE NEGATIVE mg/dL   Protein, ur 130 (A) NEGATIVE mg/dL   Nitrite NEGATIVE NEGATIVE   Leukocytes,Ua LARGE (A) NEGATIVE   RBC / HPF 21-50 0 - 5 RBC/hpf   WBC, UA >50 (H) 0 - 5 WBC/hpf   Bacteria, UA MANY (A) NONE SEEN   Squamous Epithelial / LPF 0-5 0 - 5 /HPF   WBC Clumps PRESENT    Mucus PRESENT    Meds ordered this encounter  Medications   lactated ringers bolus 1,000 mL   indomethacin (INDOCIN) capsule 50 mg   cyclobenzaprine (FLEXERIL) 10 MG tablet    Sig: Take 1 tablet (10 mg total) by mouth 2 (two) times daily as needed for muscle spasms.    Dispense:  20 tablet    Refill:  0    Order Specific Question:   Supervising Provider    Answer:   Lennart Pall [[id number]]   Assessment and Plan  --32 y.o. [id number] at [redacted]w[redacted]d  --FFN negative --Cervix remains FT/thick/posterior almost 3 hours after initial exam --Reactive tracing --Pain score improved to 5/10 prior to discharge --Discharge home in stable condition, Flexeril PRN, strict return precautions  Calvert Cantor, MSA, MSN, CNM 03/08/2022, 3:08 PM

## 2022-03-09 LAB — GC/CHLAMYDIA PROBE AMP (~~LOC~~) NOT AT ARMC
Chlamydia: NEGATIVE
Comment: NEGATIVE
Comment: NORMAL
Neisseria Gonorrhea: NEGATIVE

## 2022-03-10 LAB — CULTURE, OB URINE: Culture: >=100000 — AB

## 2022-03-11 ENCOUNTER — Telehealth (HOSPITAL_BASED_OUTPATIENT_CLINIC_OR_DEPARTMENT_OTHER): Payer: Self-pay | Admitting: Advanced Practice Midwife

## 2022-03-11 ENCOUNTER — Encounter (HOSPITAL_BASED_OUTPATIENT_CLINIC_OR_DEPARTMENT_OTHER): Payer: Self-pay | Admitting: Advanced Practice Midwife

## 2022-03-11 DIAGNOSIS — O2340 Unspecified infection of urinary tract in pregnancy, unspecified trimester: Secondary | ICD-10-CM

## 2022-03-11 MED ORDER — CEFADROXIL 500 MG PO CAPS: 500.0000 mg | ORAL_CAPSULE | Freq: Two times a day (BID) | ORAL | 0 refills | Status: AC

## 2022-03-11 NOTE — Telephone Encounter (Signed)
Called patient to notify her about positive urine culture with Ecoli.  Left message for pt to return call regarding lab results.  Rx for Duricef 500 mg BID sent to pharmacy. MyChart message sent.

## 2022-03-16 ENCOUNTER — Other Ambulatory Visit: Payer: Self-pay | Admitting: Student

## 2022-03-16 ENCOUNTER — Other Ambulatory Visit: Payer: Self-pay

## 2022-03-16 ENCOUNTER — Ambulatory Visit (INDEPENDENT_AMBULATORY_CARE_PROVIDER_SITE_OTHER): Payer: Medicaid Other | Admitting: Student

## 2022-03-16 VITALS — BP 116/83 | HR 103 | Wt 123.8 lb

## 2022-03-16 DIAGNOSIS — Z3493 Encounter for supervision of normal pregnancy, unspecified, third trimester: Secondary | ICD-10-CM

## 2022-03-16 DIAGNOSIS — Z3A30 30 weeks gestation of pregnancy: Secondary | ICD-10-CM

## 2022-03-16 DIAGNOSIS — Z23 Encounter for immunization: Secondary | ICD-10-CM

## 2022-03-16 DIAGNOSIS — Z8751 Personal history of pre-term labor: Secondary | ICD-10-CM

## 2022-03-16 MED ORDER — NIFEDIPINE ER OSMOTIC RELEASE 30 MG PO TB24: 30.0000 mg | ORAL_TABLET | Freq: Every day | ORAL | 2 refills | Status: DC

## 2022-03-16 NOTE — Progress Notes (Signed)
   PRENATAL VISIT NOTE  Subjective:  [name] is a 32 y.o. [id number] at [redacted]w[redacted]d being seen today for ongoing prenatal care.  She is currently monitored for the following issues for this high-risk pregnancy and has Supervision of low-risk pregnancy and History of pre-term labor on their problem list.  Patient reports contractions since 03/08/22  and since she went to MAU for evaluation. Flexeril was prescribed and relieves cramping when patient is able to take. However, patient does not like how the flexeril makes her feel afterwards.  Contractions: Irritability. Vag. Bleeding: None.  Movement: Present. Denies leaking of fluid.   The following portions of the patient's history were reviewed and updated as appropriate: allergies, current medications, past family history, past medical history, past social history, past surgical history and problem list.   Objective:   Vitals:   03/16/22 1326  BP: 116/83  Pulse: (!) 103  Weight: 123 lb 12.8 oz (56.2 kg)    Fetal Status: Fetal Heart Rate (bpm): 155   Movement: Present     General:  Alert, oriented and cooperative. Patient is in no acute distress.  Skin: Skin is warm and dry. No rash noted.   Cardiovascular: Normal heart rate noted  Respiratory: Normal respiratory effort, no problems with respiration noted  Abdomen: Soft, gravid, appropriate for gestational age.  Pain/Pressure: Present     Pelvic: Cervical exam deferred        Extremities: Normal range of motion.  Edema: Trace  Mental Status: Normal mood and affect. Normal behavior. Normal judgment and thought content.   Assessment and Plan:  Pregnancy: [id number] at [redacted]w[redacted]d 1. Encounter for supervision of low-risk pregnancy in third trimester - Fetal movement present  2. [redacted] weeks gestation of pregnancy - Tdap and blood work collected today - 1hr glucose test completed today because patient was not fasting  3. History of preterm delivery - Hx of PTD x 3. Last 2 deliveries were at  term - normal cervical length on last scan : 4.2cm  - has follow-up scan scheduled - Patient declined cervical exam today - Recommended Procardia XL 30mg  daily, and hold on taking flexeril due to side effects  Preterm labor symptoms and general obstetric precautions including but not limited to vaginal bleeding, contractions, leaking of fluid and fetal movement were reviewed in detail with the patient. Please refer to After Visit Summary for other counseling recommendations.   No follow-ups on file.  Future Appointments  Date Time Provider [place]  03/25/2022 10:30 AM [facility] NURSE [facility]  03/25/2022 10:45 AM [facility] US5 [facility] [place]    Johnston Ebbs, NP

## 2022-03-17 LAB — CBC
Hematocrit: 33.9 % — ABNORMAL LOW (ref 34.0–46.6)
Hemoglobin: 11.3 g/dL (ref 11.1–15.9)
MCH: 27.7 pg (ref 26.6–33.0)
MCHC: 33.3 g/dL (ref 31.5–35.7)
MCV: 83 fL (ref 79–97)
Platelets: 267 10*3/uL (ref 150–450)
RBC: 4.08 x10E6/uL (ref 3.77–5.28)
RDW: 12.5 % (ref 11.7–15.4)
WBC: 8.3 10*3/uL (ref 3.4–10.8)

## 2022-03-17 LAB — RPR: RPR Ser Ql: NONREACTIVE

## 2022-03-17 LAB — HIV ANTIBODY (ROUTINE TESTING W REFLEX): HIV Screen 4th Generation wRfx: NONREACTIVE

## 2022-03-17 LAB — GLUCOSE TOLERANCE, 1 HOUR: Glucose, 1Hr PP: 125 mg/dL (ref 70–199)

## 2022-03-25 ENCOUNTER — Ambulatory Visit: Payer: Medicaid Other | Attending: Obstetrics and Gynecology

## 2022-03-25 ENCOUNTER — Other Ambulatory Visit: Payer: Self-pay | Admitting: Obstetrics and Gynecology

## 2022-03-25 ENCOUNTER — Ambulatory Visit: Payer: Medicaid Other

## 2022-03-25 ENCOUNTER — Other Ambulatory Visit: Payer: Self-pay

## 2022-03-25 VITALS — BP 97/59 | HR 91

## 2022-03-25 DIAGNOSIS — Z3A31 31 weeks gestation of pregnancy: Secondary | ICD-10-CM | POA: Diagnosis not present

## 2022-03-25 DIAGNOSIS — O09892 Supervision of other high risk pregnancies, second trimester: Secondary | ICD-10-CM | POA: Insufficient documentation

## 2022-03-25 DIAGNOSIS — O09893 Supervision of other high risk pregnancies, third trimester: Secondary | ICD-10-CM

## 2022-03-25 DIAGNOSIS — O99013 Anemia complicating pregnancy, third trimester: Secondary | ICD-10-CM | POA: Diagnosis not present

## 2022-03-25 DIAGNOSIS — Z362 Encounter for other antenatal screening follow-up: Secondary | ICD-10-CM

## 2022-03-25 DIAGNOSIS — D649 Anemia, unspecified: Secondary | ICD-10-CM | POA: Diagnosis not present

## 2022-03-25 DIAGNOSIS — O365930 Maternal care for other known or suspected poor fetal growth, third trimester, not applicable or unspecified: Secondary | ICD-10-CM

## 2022-03-25 DIAGNOSIS — O09213 Supervision of pregnancy with history of pre-term labor, third trimester: Secondary | ICD-10-CM | POA: Diagnosis not present

## 2022-03-26 ENCOUNTER — Other Ambulatory Visit: Payer: Self-pay

## 2022-03-26 DIAGNOSIS — Z3493 Encounter for supervision of normal pregnancy, unspecified, third trimester: Secondary | ICD-10-CM

## 2022-03-30 ENCOUNTER — Ambulatory Visit (INDEPENDENT_AMBULATORY_CARE_PROVIDER_SITE_OTHER): Payer: Medicaid Other | Admitting: Certified Nurse Midwife

## 2022-03-30 ENCOUNTER — Other Ambulatory Visit: Payer: Medicaid Other

## 2022-03-30 VITALS — BP 103/71 | HR 92 | Wt 124.7 lb

## 2022-03-30 DIAGNOSIS — Z3A32 32 weeks gestation of pregnancy: Secondary | ICD-10-CM

## 2022-03-30 DIAGNOSIS — Z3493 Encounter for supervision of normal pregnancy, unspecified, third trimester: Secondary | ICD-10-CM

## 2022-03-30 DIAGNOSIS — O0993 Supervision of high risk pregnancy, unspecified, third trimester: Secondary | ICD-10-CM

## 2022-03-30 DIAGNOSIS — Z8751 Personal history of pre-term labor: Secondary | ICD-10-CM

## 2022-03-30 DIAGNOSIS — O365990 Maternal care for other known or suspected poor fetal growth, unspecified trimester, not applicable or unspecified: Secondary | ICD-10-CM

## 2022-03-30 NOTE — Progress Notes (Signed)
   PRENATAL VISIT NOTE  Subjective:  [name] is a 32 y.o. [id number] at [redacted]w[redacted]d being seen today for ongoing prenatal care.  She is currently monitored for the following issues for this high-risk pregnancy and has Supervision of low-risk pregnancy and History of pre-term labor on their problem list.  Patient reports no complaints.  Contractions: Irritability. Vag. Bleeding: None.  Movement: Present. Denies leaking of fluid.   The following portions of the patient's history were reviewed and updated as appropriate: allergies, current medications, past family history, past medical history, past social history, past surgical history and problem list.   Objective:   Vitals:   03/30/22 0859  BP: 103/71  Pulse: 92  Weight: 124 lb 11.2 oz (56.6 kg)   Fetal Status: Fetal Heart Rate (bpm): 150 Fundal Height: 31 cm Movement: Present     General:  Alert, oriented and cooperative. Patient is in no acute distress.  Skin: Skin is warm and dry. No rash noted.   Cardiovascular: Normal heart rate noted  Respiratory: Normal respiratory effort, no problems with respiration noted  Abdomen: Soft, gravid, appropriate for gestational age.  Pain/Pressure: Present     Pelvic: Cervical exam deferred        Extremities: Normal range of motion.  Edema: Trace  Mental Status: Normal mood and affect. Normal behavior. Normal judgment and thought content.   Assessment and Plan:  Pregnancy: [id number] at [redacted]w[redacted]d 1. Encounter for supervision of low-risk pregnancy in third trimester - Doing well, feeling regular and vigorous fetal movement   2. [redacted] weeks gestation of pregnancy - Routine OB care  - Canceled repeat labs since she's had a normal A1C and normal 1hr at last visit. All other 3rd trimester labs also normal  3. History of pre-term labor - No cramping/contractions today, taking nifedipine prn for preterm contractions  4. Fetal growth restriction antepartum - 8%ile 03/25/22 with normal Dopplers, recommended  delivery at 38-39 weeks - Next U/S 04/02/22  Preterm labor symptoms and general obstetric precautions including but not limited to vaginal bleeding, contractions, leaking of fluid and fetal movement were reviewed in detail with the patient. Please refer to After Visit Summary for other counseling recommendations.   Return in about 2 weeks (around 04/13/2022) for IN-PERSON, [place].  Future Appointments  Date Time Provider [place]  03/30/2022 10:15 AM Gabriel Carina, CNM [facility]  04/02/2022  1:45 PM [facility] NURSE [facility]  04/02/2022  2:00 PM [facility] US1 [facility]  04/08/2022 10:30 AM [facility] NURSE [facility]  04/08/2022 10:45 AM [facility] US5 [facility]  04/13/2022  3:35 PM Tresea Mall, CNM [facility]  04/14/2022 12:30 PM [facility] NURSE [facility]  04/14/2022 12:45 PM [facility] US5 [facility]  04/22/2022 10:15 AM [facility] NURSE [facility]  04/22/2022 10:30 AM [facility] US2 [facility]  04/27/2022  2:35 PM Autry-Lott, Naaman Plummer, DO [facility]  04/29/2022 10:15 AM [facility] NURSE [facility]  [facility]  04/29/2022 10:30 AM [facility] US2 [facility]  05/06/2022 10:15 AM [facility] NURSE [facility]  05/06/2022 10:30 AM [facility] US3 [facility]  05/12/2022 10:30 AM [facility] NURSE [facility]  05/12/2022 10:45 AM [facility] US4 [facility]   Gabriel Carina, CNM

## 2022-04-02 ENCOUNTER — Ambulatory Visit: Payer: Medicaid Other | Attending: Maternal & Fetal Medicine

## 2022-04-02 ENCOUNTER — Ambulatory Visit: Payer: Medicaid Other

## 2022-04-08 ENCOUNTER — Other Ambulatory Visit: Payer: Self-pay | Admitting: Maternal & Fetal Medicine

## 2022-04-08 ENCOUNTER — Other Ambulatory Visit: Payer: Self-pay | Admitting: *Deleted

## 2022-04-08 ENCOUNTER — Ambulatory Visit: Payer: Medicaid Other | Admitting: *Deleted

## 2022-04-08 ENCOUNTER — Ambulatory Visit: Payer: Medicaid Other | Attending: Maternal & Fetal Medicine

## 2022-04-08 VITALS — BP 99/61 | HR 93

## 2022-04-08 DIAGNOSIS — O99013 Anemia complicating pregnancy, third trimester: Secondary | ICD-10-CM | POA: Diagnosis not present

## 2022-04-08 DIAGNOSIS — D649 Anemia, unspecified: Secondary | ICD-10-CM | POA: Diagnosis not present

## 2022-04-08 DIAGNOSIS — O09893 Supervision of other high risk pregnancies, third trimester: Secondary | ICD-10-CM | POA: Diagnosis present

## 2022-04-08 DIAGNOSIS — Z3A33 33 weeks gestation of pregnancy: Secondary | ICD-10-CM

## 2022-04-08 DIAGNOSIS — O365930 Maternal care for other known or suspected poor fetal growth, third trimester, not applicable or unspecified: Secondary | ICD-10-CM

## 2022-04-08 DIAGNOSIS — O365990 Maternal care for other known or suspected poor fetal growth, unspecified trimester, not applicable or unspecified: Secondary | ICD-10-CM | POA: Diagnosis present

## 2022-04-08 DIAGNOSIS — O09213 Supervision of pregnancy with history of pre-term labor, third trimester: Secondary | ICD-10-CM

## 2022-04-09 ENCOUNTER — Inpatient Hospital Stay (HOSPITAL_COMMUNITY)
Admission: AD | Admit: 2022-04-09 | Discharge: 2022-04-09 | Disposition: A | Discharge status: Home / Self Care | Payer: Medicaid Other | Attending: Obstetrics & Gynecology | Admitting: Obstetrics & Gynecology

## 2022-04-09 ENCOUNTER — Other Ambulatory Visit: Payer: Self-pay

## 2022-04-09 ENCOUNTER — Encounter (HOSPITAL_COMMUNITY): Payer: Self-pay | Admitting: Obstetrics & Gynecology

## 2022-04-09 DIAGNOSIS — O09213 Supervision of pregnancy with history of pre-term labor, third trimester: Secondary | ICD-10-CM | POA: Insufficient documentation

## 2022-04-09 DIAGNOSIS — O09293 Supervision of pregnancy with other poor reproductive or obstetric history, third trimester: Secondary | ICD-10-CM | POA: Insufficient documentation

## 2022-04-09 DIAGNOSIS — O4703 False labor before 37 completed weeks of gestation, third trimester: Secondary | ICD-10-CM | POA: Diagnosis not present

## 2022-04-09 DIAGNOSIS — Z3A34 34 weeks gestation of pregnancy: Secondary | ICD-10-CM | POA: Insufficient documentation

## 2022-04-09 MED ORDER — NIFEDIPINE 10 MG PO CAPS
10.0000 mg | ORAL_CAPSULE | ORAL | Status: AC | PRN
  Administered 2022-04-09 (×3): 10 mg via ORAL
  Filled 2022-04-09 (×3): qty 1

## 2022-04-09 MED ORDER — LACTATED RINGERS IV BOLUS
1000.0000 mL | Freq: Once | INTRAVENOUS | Status: AC
  Administered 2022-04-09: 1000 mL via INTRAVENOUS

## 2022-04-09 NOTE — MAU Provider Note (Cosign Needed Addendum)
History     CSN: [id number]  Arrival date and time: 04/09/22 2021   Event Date/Time   First Provider Initiated Contact with Patient 04/09/22 2040      Chief Complaint  Patient presents with   Contractions   HPI  [name] is a 32 y.o. [id number] at [redacted]w[redacted]d who presents for evaluation of contractions. Patient reports she has been having contractions every 4-5 minutes since 1100. Patient rates the pain as a 8/10 and has not tried anything for the pain. She has procardia PRN for contractions and last took that at 1000. She denies any vaginal bleeding, discharge, and leaking of fluid. Denies any constipation, diarrhea or any urinary complaints. Reports normal fetal movement.  OB History     Gravida  7   Para  5   Term  2   Preterm  3   AB  1   Living  5      SAB  1   IAB  0   Ectopic  0   Multiple  0   Live Births  5           Past Medical History:  Diagnosis Date   Anemia    iron    History of preterm delivery, currently pregnant 08/08/2018   She used 17 P with last pregnancy and will do it again with this one.   History of preterm delivery, currently pregnant, unspecified trimester 04/04/2017    too late for [facility], started prometrium   No pertinent past medical history    Preterm delivery    Stillbirth    Supervision of high risk pregnancy, antepartum 08/08/2018    Nursing Staff Provider Office Location  [place] Dating   Language   Egnlis Anatomy US  scheduled Flu Vaccine   Genetic Screen  NIPS: low risk female  TDaP vaccine    Hgb A1C or  GTT  Third trimester  Rhogam   n/a   LAB RESULTS  Feeding Plan Breast Blood Type --/--/AB POS, AB POS Performed at [facility], [address] [place], [place] [address]  [phone]) AB+ Contraception  BTL Antib   UTI (lower urinary tract infection)    Vaginal wall cyst 01/02/2015   See photo in note from 01/01/15     Past Surgical History:  Procedure Laterality Date   NO PAST SURGERIES      Family  History  Problem Relation Age of Onset   Hypertension Mother    Cancer Sister    Asthma Son    Diabetes Neg Hx    Heart disease Neg Hx    Stroke Neg Hx     Social History   Tobacco Use   Smoking status: Never   Smokeless tobacco: Never  Vaping Use   Vaping Use: Never used  Substance Use Topics   Alcohol use: No   Drug use: No    Allergies:  Allergies  Allergen Reactions   Cherry Hives and Itching    Medications Prior to Admission  Medication Sig Dispense Refill Last Dose   NIFEdipine (PROCARDIA-XL/NIFEDICAL-XL) 30 MG 24 hr tablet Take 1 tablet (30 mg total) by mouth daily. Can increase to twice a day as needed for symptomatic contractions 30 tablet 2 04/09/2022   cyclobenzaprine (FLEXERIL) 10 MG tablet Take 1 tablet (10 mg total) by mouth 2 (two) times daily as needed for muscle spasms. 20 tablet 0    ferrous sulfate 325 (65 FE) MG tablet Take 325 mg by  mouth daily with breakfast.      metoCLOPramide (REGLAN) 10 MG tablet Take 1 tablet (10 mg total) by mouth 4 (four) times daily. (Patient not taking: Reported on 03/25/2022) 30 tablet 1    Prenatal Vit-Fe Fumarate-FA (PRENATAL MULTIVITAMIN) TABS tablet Take 1 tablet by mouth daily at 12 noon.      promethazine (PHENERGAN) 25 MG tablet Take 1 tablet (25 mg total) by mouth every 6 (six) hours as needed for nausea or vomiting. (Patient not taking: Reported on 04/08/2022) 30 tablet 1     Review of Systems  Constitutional: Negative.  Negative for fatigue and fever.  HENT: Negative.    Respiratory: Negative.  Negative for shortness of breath.   Cardiovascular: Negative.  Negative for chest pain.  Gastrointestinal:  Positive for abdominal pain. Negative for constipation, diarrhea, nausea and vomiting.  Genitourinary: Negative.  Negative for dysuria, vaginal bleeding and vaginal discharge.  Neurological: Negative.  Negative for dizziness and headaches.   Physical Exam   Blood pressure 106/67, pulse 76, temperature 97.7 F (36.5  C), resp. rate 16, SpO2 100 %.  Patient Vitals for the past 24 hrs:  BP Temp Pulse Resp SpO2  04/09/22 2039 106/67 -- 76 -- --  04/09/22 2038 -- 97.7 F (36.5 C) -- 16 100 %    Physical Exam Vitals and nursing note reviewed.  Constitutional:      General: She is not in acute distress.    Appearance: She is well-developed.  HENT:     Head: Normocephalic.  Eyes:     Pupils: Pupils are equal, round, and reactive to light.  Cardiovascular:     Rate and Rhythm: Normal rate and regular rhythm.     Heart sounds: Normal heart sounds.  Pulmonary:     Effort: Pulmonary effort is normal. No respiratory distress.     Breath sounds: Normal breath sounds.  Abdominal:     General: Bowel sounds are normal. There is no distension.     Palpations: Abdomen is soft.     Tenderness: There is no abdominal tenderness.  Skin:    General: Skin is warm and dry.  Neurological:     Mental Status: She is alert and oriented to person, place, and time.  Psychiatric:        Mood and Affect: Mood normal.        Behavior: Behavior normal.        Thought Content: Thought content normal.        Judgment: Judgment normal.     Fetal Tracing:  Baseline: 140 Variability: moderate Accels: 15x15 Decels: none  Toco: 1-5  Cervix:  2045: 2/50/ballotable  Reassessment @ 2245: Cervix Unchanged   MAU Course  Procedures  No results found for this or any previous visit (from the past 24 hour(s)).   MDM Labs ordered and reviewed.   UA LR bolus Procardia  Care turned over to R. Renato Battles CNM at 2100. Wende Mott, CNM 04/09/22   Assessment and Plan  1. Preterm uterine contractions in third trimester, antepartum - Information provided on preterm labor and preventing preterm birth   2. [redacted] weeks gestation of pregnancy   - Discharge patient - Keep scheduled appt with [facility] on 04/13/2022 - Patient verbalized an understanding of the plan of care and agrees.    Laury Deep, CNM  04/09/2022  11:01 PM

## 2022-04-09 NOTE — Discharge Instructions (Addendum)
Please take your Procardia (Nifedipine) as previously prescribed. Return to MAU for 6 or greater PAINFUL contractions in 1 hour.

## 2022-04-09 NOTE — MAU Note (Signed)
PT unable to give urine sample during visit.

## 2022-04-09 NOTE — MAU Note (Signed)
.  [name] is a 32 y.o. at [redacted]w[redacted]d here in MAU reporting: ctx's 4-5 minutes apart since 1100  Onset of complaint: today  Pain score: 8/10 There were no vitals filed for this visit.    Lab orders placed from triage:   ua

## 2022-04-12 ENCOUNTER — Encounter: Payer: Self-pay | Admitting: Family Medicine

## 2022-04-13 ENCOUNTER — Other Ambulatory Visit: Payer: Self-pay

## 2022-04-13 ENCOUNTER — Ambulatory Visit (INDEPENDENT_AMBULATORY_CARE_PROVIDER_SITE_OTHER): Payer: Medicaid Other | Admitting: Advanced Practice Midwife

## 2022-04-13 ENCOUNTER — Encounter: Payer: Self-pay | Admitting: Advanced Practice Midwife

## 2022-04-13 VITALS — BP 106/68 | HR 93 | Wt 126.0 lb

## 2022-04-13 DIAGNOSIS — O365990 Maternal care for other known or suspected poor fetal growth, unspecified trimester, not applicable or unspecified: Secondary | ICD-10-CM | POA: Insufficient documentation

## 2022-04-13 DIAGNOSIS — O365930 Maternal care for other known or suspected poor fetal growth, third trimester, not applicable or unspecified: Secondary | ICD-10-CM

## 2022-04-13 DIAGNOSIS — Z3493 Encounter for supervision of normal pregnancy, unspecified, third trimester: Secondary | ICD-10-CM

## 2022-04-13 DIAGNOSIS — Z3A34 34 weeks gestation of pregnancy: Secondary | ICD-10-CM

## 2022-04-13 HISTORY — DX: Maternal care for other known or suspected poor fetal growth, unspecified trimester, not applicable or unspecified: O36.5990

## 2022-04-13 NOTE — Progress Notes (Signed)
   PRENATAL VISIT NOTE  Subjective:  [name] is a 32 y.o. [id number] at [redacted]w[redacted]d being seen today for ongoing prenatal care.  She is currently monitored for the following issues for this low-risk pregnancy and has Supervision of low-risk pregnancy; History of pre-term labor; and Fetal growth restriction antepartum on their problem list.  Patient reports no complaints.  Contractions: Irritability. Vag. Bleeding: None.  Movement: Present. Denies leaking of fluid.   The following portions of the patient's history were reviewed and updated as appropriate: allergies, current medications, past family history, past medical history, past social history, past surgical history and problem list.   Objective:   Vitals:   04/13/22 1601  BP: 106/68  Pulse: 93  Weight: 126 lb (57.2 kg)    Fetal Status: Fetal Heart Rate (bpm): 156 Fundal Height: 34 cm Movement: Present     General:  Alert, oriented and cooperative. Patient is in no acute distress.  Skin: Skin is warm and dry. No rash noted.   Cardiovascular: Normal heart rate noted  Respiratory: Normal respiratory effort, no problems with respiration noted  Abdomen: Soft, gravid, appropriate for gestational age.  Pain/Pressure: Present     Pelvic: Cervical exam deferred        Extremities: Normal range of motion.  Edema: Trace  Mental Status: Normal mood and affect. Normal behavior. Normal judgment and thought content.   Assessment and Plan:  Pregnancy: [id number] at [redacted]w[redacted]d 1. Encounter for supervision of low-risk pregnancy in third trimester - routine care  2. [redacted] weeks gestation of pregnancy -GBS at next visit   3. Growth restriction  - FU per MFM   Preterm labor symptoms and general obstetric precautions including but not limited to vaginal bleeding, contractions, leaking of fluid and fetal movement were reviewed in detail with the patient. Please refer to After Visit Summary for other counseling recommendations.   Return in about 2 weeks  (around 04/27/2022).  Future Appointments  Date Time Provider [place]  04/14/2022 12:30 PM [facility] NURSE [facility]  04/14/2022 12:45 PM [facility] US5 [facility]  04/22/2022 10:15 AM [facility] NURSE [facility]  04/22/2022 10:30 AM [facility] US2 [facility]  04/27/2022  2:35 PM Autry-Lott, Naaman Plummer, DO [facility]  04/29/2022 10:15 AM [facility] NURSE [facility]  04/29/2022 10:30 AM [facility] US2 [facility]  05/06/2022 10:15 AM [facility] NURSE [facility]  05/06/2022 10:30 AM [facility] US3 [facility]  05/12/2022 10:30 AM [facility] NURSE [facility]  05/12/2022 10:45 AM [facility] US4 [facility] [place] DNP, CNM  04/13/22  4:38 PM

## 2022-04-14 ENCOUNTER — Ambulatory Visit: Payer: Medicaid Other | Attending: Maternal & Fetal Medicine

## 2022-04-14 ENCOUNTER — Ambulatory Visit: Payer: Medicaid Other

## 2022-04-14 VITALS — BP 109/63 | HR 89

## 2022-04-14 DIAGNOSIS — O09213 Supervision of pregnancy with history of pre-term labor, third trimester: Secondary | ICD-10-CM

## 2022-04-14 DIAGNOSIS — D649 Anemia, unspecified: Secondary | ICD-10-CM

## 2022-04-14 DIAGNOSIS — O99013 Anemia complicating pregnancy, third trimester: Secondary | ICD-10-CM | POA: Insufficient documentation

## 2022-04-14 DIAGNOSIS — O09293 Supervision of pregnancy with other poor reproductive or obstetric history, third trimester: Secondary | ICD-10-CM

## 2022-04-14 DIAGNOSIS — O365930 Maternal care for other known or suspected poor fetal growth, third trimester, not applicable or unspecified: Secondary | ICD-10-CM | POA: Diagnosis not present

## 2022-04-14 DIAGNOSIS — O365990 Maternal care for other known or suspected poor fetal growth, unspecified trimester, not applicable or unspecified: Secondary | ICD-10-CM | POA: Diagnosis present

## 2022-04-14 DIAGNOSIS — O09893 Supervision of other high risk pregnancies, third trimester: Secondary | ICD-10-CM | POA: Insufficient documentation

## 2022-04-14 DIAGNOSIS — Z3A34 34 weeks gestation of pregnancy: Secondary | ICD-10-CM | POA: Diagnosis not present

## 2022-04-22 ENCOUNTER — Ambulatory Visit: Payer: Medicaid Other | Attending: Maternal & Fetal Medicine

## 2022-04-22 ENCOUNTER — Ambulatory Visit: Payer: Medicaid Other

## 2022-04-24 ENCOUNTER — Inpatient Hospital Stay (HOSPITAL_COMMUNITY)
Admission: AD | Admit: 2022-04-24 | Discharge: 2022-04-25 | Disposition: A | Discharge status: Home / Self Care | Payer: Medicaid Other | Attending: Obstetrics & Gynecology | Admitting: Obstetrics & Gynecology

## 2022-04-24 DIAGNOSIS — Z3A36 36 weeks gestation of pregnancy: Secondary | ICD-10-CM

## 2022-04-24 DIAGNOSIS — M549 Dorsalgia, unspecified: Secondary | ICD-10-CM | POA: Insufficient documentation

## 2022-04-24 DIAGNOSIS — O26893 Other specified pregnancy related conditions, third trimester: Secondary | ICD-10-CM | POA: Insufficient documentation

## 2022-04-24 DIAGNOSIS — O4703 False labor before 37 completed weeks of gestation, third trimester: Secondary | ICD-10-CM

## 2022-04-24 DIAGNOSIS — Z3689 Encounter for other specified antenatal screening: Secondary | ICD-10-CM

## 2022-04-24 NOTE — MAU Note (Signed)
.  Sherylann Prieur is a 32 y.o. at 4w1dhere in MAU reporting: ctx since 9 pm 5-6 min apart. Reports losing her mucus plug but no leaking. Good fetal movement. 2.5 cm since 33 weeks.  LMP:  Onset of complaint: 9pm Pain score: 8 Vitals:   04/24/22 2341  BP: 108/67  Pulse: (!) 101  Resp: 18  Temp: 97.6 F (36.4 C)     FHT:149 Lab orders placed from triage:  labor eval

## 2022-04-25 ENCOUNTER — Encounter (HOSPITAL_COMMUNITY): Payer: Self-pay | Admitting: Obstetrics & Gynecology

## 2022-04-25 DIAGNOSIS — O4703 False labor before 37 completed weeks of gestation, third trimester: Secondary | ICD-10-CM

## 2022-04-25 DIAGNOSIS — O26893 Other specified pregnancy related conditions, third trimester: Secondary | ICD-10-CM | POA: Diagnosis present

## 2022-04-25 DIAGNOSIS — Z3A36 36 weeks gestation of pregnancy: Secondary | ICD-10-CM

## 2022-04-25 DIAGNOSIS — M549 Dorsalgia, unspecified: Secondary | ICD-10-CM | POA: Diagnosis not present

## 2022-04-25 MED ORDER — CYCLOBENZAPRINE HCL 5 MG PO TABS
10.0000 mg | ORAL_TABLET | Freq: Once | ORAL | Status: AC
  Administered 2022-04-25: 10 mg via ORAL
  Filled 2022-04-25 (×2): qty 2

## 2022-04-25 NOTE — MAU Provider Note (Signed)
S: [name] is a 32 y.o. [id number] at [redacted]w[redacted]d who presents to MAU today for labor evaluation.   Nurse reports after 2 hours patient with minimal cervical change.  Further reports patient request medication for back pain.   Cervical exam by RN:  Dilation: 2.5 Effacement (%): 70 Cervical Position: Posterior Station: -3 Presentation: Vertex Exam by:: PHuston FoleyRN  Fetal Monitoring: Baseline: 145 Variability: Moderate Accelerations: Present 15x15 Decelerations: None Contractions: 4-73m  MDM Discussed patient with RN. NST reviewed.   A: SIUP at 3658w2dalse labor Cat I FT  P: NST reactive. Okay to discontinue monitoring.  Flexeril ordered for c/o back pain. Nurse also instructed to place heating pad. Discharge home Labor precautions and kick counts included in AVS Patient to follow-up with primary office as scheduled  Patient may return to MAU as needed or when in labor   EmlGavin PoundNMNorth Dakota18/2024 2:34 AM

## 2022-04-27 ENCOUNTER — Encounter: Payer: Self-pay | Admitting: Family Medicine

## 2022-04-29 ENCOUNTER — Ambulatory Visit: Payer: Medicaid Other | Admitting: *Deleted

## 2022-04-29 ENCOUNTER — Ambulatory Visit: Payer: Medicaid Other | Attending: Maternal & Fetal Medicine

## 2022-04-29 VITALS — BP 99/71 | HR 103

## 2022-04-29 DIAGNOSIS — O99013 Anemia complicating pregnancy, third trimester: Secondary | ICD-10-CM | POA: Insufficient documentation

## 2022-04-29 DIAGNOSIS — D649 Anemia, unspecified: Secondary | ICD-10-CM | POA: Diagnosis not present

## 2022-04-29 DIAGNOSIS — O365990 Maternal care for other known or suspected poor fetal growth, unspecified trimester, not applicable or unspecified: Secondary | ICD-10-CM

## 2022-04-29 DIAGNOSIS — O09213 Supervision of pregnancy with history of pre-term labor, third trimester: Secondary | ICD-10-CM | POA: Diagnosis not present

## 2022-04-29 DIAGNOSIS — Z3A36 36 weeks gestation of pregnancy: Secondary | ICD-10-CM | POA: Diagnosis not present

## 2022-04-29 DIAGNOSIS — O09893 Supervision of other high risk pregnancies, third trimester: Secondary | ICD-10-CM | POA: Insufficient documentation

## 2022-04-29 DIAGNOSIS — O365930 Maternal care for other known or suspected poor fetal growth, third trimester, not applicable or unspecified: Secondary | ICD-10-CM | POA: Insufficient documentation

## 2022-04-30 NOTE — Progress Notes (Signed)
Alert received through NCNotify system as follows: Patient identified as not having a recommended prenatal visit. Patient is 36 weeks and 6 days pregnant. Most recent prenatal visit was on 04/14/2022 at [place].  Chart review: Patient's last visit was on 04/13/2022. Pt was last seen by MFM on 04/29/2022. Next appointment is scheduled for 05/04/2022.  No action needed.  Martina Sinner, RN 04/30/2022  10:20 AM

## 2022-05-01 ENCOUNTER — Inpatient Hospital Stay (HOSPITAL_COMMUNITY)
Admission: AD | Admit: 2022-05-01 | Discharge: 2022-05-01 | Disposition: A | Discharge status: Home / Self Care | Payer: Medicaid Other | Attending: Obstetrics & Gynecology | Admitting: Obstetrics & Gynecology

## 2022-05-01 ENCOUNTER — Encounter (HOSPITAL_COMMUNITY): Payer: Self-pay | Admitting: Obstetrics & Gynecology

## 2022-05-01 DIAGNOSIS — O479 False labor, unspecified: Secondary | ICD-10-CM | POA: Diagnosis not present

## 2022-05-01 DIAGNOSIS — O365990 Maternal care for other known or suspected poor fetal growth, unspecified trimester, not applicable or unspecified: Secondary | ICD-10-CM

## 2022-05-01 DIAGNOSIS — Z3A37 37 weeks gestation of pregnancy: Secondary | ICD-10-CM | POA: Diagnosis not present

## 2022-05-01 DIAGNOSIS — O471 False labor at or after 37 completed weeks of gestation: Secondary | ICD-10-CM | POA: Insufficient documentation

## 2022-05-01 DIAGNOSIS — Z3689 Encounter for other specified antenatal screening: Secondary | ICD-10-CM

## 2022-05-01 NOTE — MAU Provider Note (Signed)
Patient was assessed for active labor and managed by nursing staff during this encounter. I have reviewed the chart and agree with the documentation and plan. I have also reviewed the NST for appropriate reactivity.  Fetal Tracing: reactive Baseline: 150 Variability: moderate Accelerations: 15x15  Decelerations: none Toco: q1-16mn (many coupleted)   Patient stable for discharge home with labor precautions and instructions for [employer]Mar[employer] Oil  JGaylan Gerold CNM, MSN, ILinnCertified Nurse Midwife, CRobinsonGroup 05/01/22 1:10 PM

## 2022-05-01 NOTE — Discharge Instructions (Signed)
The [employer]  This circuit takes at least 90 minutes to complete so clear your schedule and make mental preparations so you can relax in your environment. The second step requires a lot of pillows so gather them up before beginning Before starting, you should empty your bladder! Have a nice drink nearby, and make sure it has a straw! If you are having contractions, this circuit should be done through contractions, try not to change positions between steps Before you begin...  "I named this 'circuit' after my friend [name], who shared and discussed it with me when I was working with a client whose labor seemed to be stalled out and no longer progressing... This circuit is useful to help get the baby lined up, ideally, in the "Left Occiput Anterior" (LOA) Position, both before labor begins and when some corrections need to be done during labor. Prenatally, this position set can help to rotate a baby. As a natural method of induction, this can help get things going if baby just needed a gentle nudge of position to set things off. To the best of my knowledge, this group of positions will not "hurt" a baby that is already lined up correctly." - [name]   Step One: Open-knee Chest Stay in this position for 30 minutes, start in cat/cow, then drop your chest as low as you can to the bed or the floor and your bottom as high as you can. Knees should be fairly wide apart, and the angle between the torso/thighs should be wider than 90 degrees. Wiggle around, prop with lots of pillows and use this time to get totally relaxed. This position allows the baby to scoot out of the pelvis a bit and gives them room to rotate, shift their head position, etc. If the pregnant person finds it helpful, careful positioning with a rebozo under the belly, with gentle tension from a support person behind can help maintain this position for the full 30 minutes.  Step Two:Exaggerated Left Side  Lying Roll to your left side, bringing your top leg as high as possible and keeping your bottom leg straight. Roll forward as much as possible, again using a lot of pillows. Sink into the bed and relax some more. If you fall asleep, that's totally okay and you can stay there! If not, stay here for at least another half an hour. Try and get your top right leg up towards your head and get as rolled over onto your belly as much as possible. If you repeat the circuit during labor, try alternating left and right sides. We know the photo the left is actually right side... just flip the image in your head.  Step Three: Moving and Lunges Lunge, walk stairs facing sideways, 2 at a time, (have a spotter downstairs of you!), take a walk outside with one foot on the curb and the other on the street, sit on a birth ball and hula- anything that's upright and putting your pelvis in open, asymmetrical positions. Spend at least 30 minutes doing this one as well to give your baby a chance to move down. If you are lunging or stair or curb walking, you should lunge/walk/go up stairs in the direction that feels better to you. The key with the lunge is that the toes of the higher leg and mom's belly button should be at right angles. Do not lunge over your knee, that closes the pelvis.     [name]: Circuit Creator - [email or web address] Mackenzie   [name], CD, BDT (DONA), LCCE, FACCE: Supporting Content - [email or web address] [name]: Photography - [email or web address] [name] CD/CDT (BAI): Print and Webmaster - [email or web address] [name] [email or web address]  

## 2022-05-01 NOTE — MAU Note (Signed)
.  [name] is a 32 y.o. at 61w1dhere in MAU reporting: ctx since 9am. 4-5 min apart.3 cm on last exam. Denies any vag bleeding or leaking. Good fetal movmement reported.  LMP:  Onset of complaint: 9am Pain score: 8 There were no vitals filed for this visit.   FHT:156 Lab orders placed from triage:

## 2022-05-04 ENCOUNTER — Encounter: Payer: Medicaid Other | Admitting: Advanced Practice Midwife

## 2022-05-05 ENCOUNTER — Encounter (HOSPITAL_COMMUNITY): Payer: Self-pay | Admitting: Obstetrics & Gynecology

## 2022-05-05 ENCOUNTER — Inpatient Hospital Stay (HOSPITAL_COMMUNITY)
Admission: AD | Admit: 2022-05-05 | Discharge: 2022-05-06 | Disposition: A | Discharge status: Home / Self Care | Payer: Medicaid Other | Attending: Obstetrics & Gynecology | Admitting: Obstetrics & Gynecology

## 2022-05-05 DIAGNOSIS — O368130 Decreased fetal movements, third trimester, not applicable or unspecified: Secondary | ICD-10-CM

## 2022-05-05 DIAGNOSIS — O479 False labor, unspecified: Secondary | ICD-10-CM

## 2022-05-05 DIAGNOSIS — Z3A37 37 weeks gestation of pregnancy: Secondary | ICD-10-CM | POA: Insufficient documentation

## 2022-05-05 DIAGNOSIS — O471 False labor at or after 37 completed weeks of gestation: Secondary | ICD-10-CM | POA: Insufficient documentation

## 2022-05-05 NOTE — MAU Provider Note (Signed)
Chief Complaint:  Labor Eval and Contractions   Event Date/Time   First Provider Initiated Contact with Patient 05/05/22 2357     HPI: [name] is a 32 y.o. [id number] at 90w5dho presents to maternity admissions reporting painful contractions.  I was asked to see her for decreased fetal movement today.. She denies LOF, vaginal bleeding, h/a, dizziness, n/v, diarrhea, constipation or fever/chills.   . Abdominal Pain The current episode started today. The problem occurs intermittently. The quality of the pain is cramping. Pertinent negatives include no fever or headaches. Nothing aggravates the pain. The pain is relieved by Nothing.     Past Medical History: Past Medical History:  Diagnosis Date   Anemia    iron    History of preterm delivery, currently pregnant 08/08/2018   She used 17 P with last pregnancy and will do it again with this one.   History of preterm delivery, currently pregnant, unspecified trimester 04/04/2017    [facility]   No pertinent past medical history    Preterm delivery    Stillbirth    Supervision of high risk pregnancy, antepartum 08/08/2018    Nursing Staff Provider Office Location  [place]   Language   Egnlis Anatomy [place] scheduled Flu Vaccine   Genetic Screen  NIPS: low risk female  TDaP vaccine    Hgb A1C or  GTT  Third trimester  Rhogam   n/a   LAB RESULTS  Feeding Plan Breast Blood Type --/--/AB POS, AB POS [facility] [address], [place] [address] ([phone] AB+ Contraception  BTL Antib   UTI (lower urinary tract infection)    Vaginal wall cyst 01/02/2015   See photo in note from 01/01/15     Past obstetric history: OB History  Gravida Para Term Preterm AB Living  '7 5 2 3 1 5  '$ SAB IAB Ectopic Multiple Live Births  1 0 0 0 5    # Outcome Date GA Lbr Len/2nd Weight Sex Delivery Anes PTL Lv  7 Current           6 Term 07/21/17 [id number] g F Vag-Spont None Y LIV  5 Preterm  02/26/15 364w2d:50 / 00:12 2455 g F Vag-Spont None Y LIV  4 Preterm 09/11/13 34w38w6d:01 / 00:04 1980 g M Vag-Spont None Y LIV  3 Preterm 11/24/12 33w349w3d5 / 00:45 1810 g F Vag-Spont None Y LIV  2 Term         LIV  1 SAB  74w3d80w3dN      Birth Comments: PPROM at 19 wee36 3 days; ??incomop cervix-did not labor at home, came in with membranes ruptured.    Past Surgical History: Past Surgical History:  Procedure Laterality Date   NO PAST SURGERIES      Family History: Family History  Problem Relation Age of Onset   Hypertension Mother    Cancer Sister    Asthma Son    Diabetes Neg Hx    Heart disease Neg Hx    Stroke Neg Hx     Social History: Social History   Tobacco Use   Smoking status: Never   Smokeless tobacco: Never  Vaping Use   Vaping Use: Never used  Substance Use Topics   Alcohol use: No   Drug use: No    Allergies:  Allergies  Allergen Reactions   Cherry Hives and  Itching    Meds:  Medications Prior to Admission  Medication Sig Dispense Refill Last Dose   ferrous sulfate 325 (65 FE) MG tablet Take 325 mg by mouth daily with breakfast.   05/05/2022   NIFEdipine (PROCARDIA-XL/NIFEDICAL-XL) 30 MG 24 hr tablet Take 1 tablet (30 mg total) by mouth daily. Can increase to twice a day as needed for symptomatic contractions 30 tablet 2 05/05/2022   Prenatal Vit-Fe Fumarate-FA (PRENATAL MULTIVITAMIN) TABS tablet Take 1 tablet by mouth daily at 12 noon.   05/05/2022   cyclobenzaprine (FLEXERIL) 10 MG tablet Take 1 tablet (10 mg total) by mouth 2 (two) times daily as needed for muscle spasms. 20 tablet 0    metoCLOPramide (REGLAN) 10 MG tablet Take 1 tablet (10 mg total) by mouth 4 (four) times daily. (Patient not taking: Reported on 03/25/2022) 30 tablet 1    promethazine (PHENERGAN) 25 MG tablet Take 1 tablet (25 mg total) by mouth every 6 (six) hours as needed for nausea or vomiting. (Patient not taking: Reported on 04/08/2022) 30 tablet 1     I have reviewed  patient's Past Medical Hx, Surgical Hx, Family Hx, Social Hx, medications and allergies.   ROS:  Review of Systems  Constitutional:  Negative for fever.  Gastrointestinal:  Positive for abdominal pain.  Neurological:  Negative for headaches.   Other systems negative  Physical Exam  Patient Vitals for the past 24 hrs:  BP Temp Temp src Pulse Resp SpO2 Height Weight  05/05/22 2345 116/71 98.1 F (36.7 C) Oral 91 16 99 % '5\' 3"'$  (1.6 m) 58.5 kg   Constitutional: Well-developed, well-nourished female in no acute distress.  Cardiovascular: normal rate Respiratory: normal effort GI: Abd soft, non-tender, gravid appropriate for gestational age.   No rebound or guarding. MS: Extremities nontender, no edema, normal ROM Neurologic: Alert and oriented x 4.  GU: Neg CVAT.  PELVIC EXAM: Cervix pink, visually closed, without lesion, scant white creamy discharge, vaginal walls and external genitalia normal Bimanual exam:   Dilation: 4 Effacement (%): 70 Cervical Position: Posterior Station: -3 Presentation: Vertex Exam by:: Youlanda Roys, RN  FHT:  Baseline 140 , moderate variability, accelerations present, no decelerations Contractions: q 2-3 mins Irregular    Labs: No results found for this or any previous visit (from the past 24 hour(s)). AB/Positive/-- (10/02 1015)  Imaging:    MAU Course/MDM: I have reviewed the triage vital signs and the nursing notes.   Pertinent labs & imaging results that were available during my care of the patient were reviewed by me and considered in my medical decision making (see chart for details).      I have reviewed her medical records including past results, notes and treatments.   I have ordered labs and reviewed results.  NST reviewed, reactive throughout  Treatments in MAU included EFM, Fentanyl 48mg IM for pain.    Assessment: Single IUP at 373w6dterine contractions in pregnancy Decreased fetal movement with reactive NST  Plan: Will  give her a third hour and rechedk Then RN will consult with Labor team re: plan.  MaHansel FeinsteinNM, MSN Certified Nurse-Midwife 05/05/2022 11:57 PM

## 2022-05-05 NOTE — MAU Note (Signed)
.  [name] is a 32 y.o. at 63w5dhere in MAU reporting: CTX since 2200 and DFM since 2000. Pt denies VB, LOF, PIH s/s, and recent intercourse.  Preterm labor in pregnancy  GBS neg  SVE 4 cm on Sat Onset of complaint: 2000 Pain score: 10/10 Vitals:   05/05/22 2345  BP: 116/71  Pulse: 91  Resp: 16  Temp: 98.1 F (36.7 C)  SpO2: 99%     FHT:160 Lab orders placed from triage:

## 2022-05-06 ENCOUNTER — Ambulatory Visit: Payer: Medicaid Other | Admitting: *Deleted

## 2022-05-06 ENCOUNTER — Encounter: Payer: Self-pay | Admitting: Advanced Practice Midwife

## 2022-05-06 ENCOUNTER — Ambulatory Visit: Payer: Medicaid Other | Attending: Maternal & Fetal Medicine

## 2022-05-06 VITALS — BP 110/72 | HR 86

## 2022-05-06 DIAGNOSIS — O365990 Maternal care for other known or suspected poor fetal growth, unspecified trimester, not applicable or unspecified: Secondary | ICD-10-CM | POA: Insufficient documentation

## 2022-05-06 DIAGNOSIS — Z3A37 37 weeks gestation of pregnancy: Secondary | ICD-10-CM

## 2022-05-06 DIAGNOSIS — O479 False labor, unspecified: Secondary | ICD-10-CM | POA: Diagnosis not present

## 2022-05-06 DIAGNOSIS — O365930 Maternal care for other known or suspected poor fetal growth, third trimester, not applicable or unspecified: Secondary | ICD-10-CM | POA: Diagnosis not present

## 2022-05-06 DIAGNOSIS — O99013 Anemia complicating pregnancy, third trimester: Secondary | ICD-10-CM | POA: Diagnosis not present

## 2022-05-06 DIAGNOSIS — D649 Anemia, unspecified: Secondary | ICD-10-CM

## 2022-05-06 DIAGNOSIS — O09293 Supervision of pregnancy with other poor reproductive or obstetric history, third trimester: Secondary | ICD-10-CM | POA: Diagnosis not present

## 2022-05-06 DIAGNOSIS — O471 False labor at or after 37 completed weeks of gestation: Secondary | ICD-10-CM | POA: Diagnosis present

## 2022-05-06 DIAGNOSIS — O09893 Supervision of other high risk pregnancies, third trimester: Secondary | ICD-10-CM | POA: Diagnosis present

## 2022-05-06 MED ORDER — FENTANYL CITRATE (PF) 100 MCG/2ML IJ SOLN
50.0000 ug | Freq: Once | INTRAMUSCULAR | Status: AC
  Administered 2022-05-06: 50 ug via INTRAMUSCULAR
  Filled 2022-05-06: qty 2

## 2022-05-06 NOTE — MAU Provider Note (Signed)
[name] is a [id number] at 35w6dseen in MAU for labor. RN labor check, not seen by provider. SVE by RN Dilation: 4 Effacement (%): 70 Cervical Position: Posterior Station: -3 Presentation: Vertex Exam by:: AYoulanda Roys RN   NST - FHR: 135 bpm / moderate variability / accels present / decels absent / TOCO: regular every 3-5 mins   Plan:  D/C home with labor precautions Keep scheduled appt with HMarcille Buffyon 05/11/22  JConcepcion Living MD  05/06/2022 2:20 AM

## 2022-05-11 ENCOUNTER — Encounter: Payer: Self-pay | Admitting: Advanced Practice Midwife

## 2022-05-11 ENCOUNTER — Other Ambulatory Visit (HOSPITAL_COMMUNITY)
Admission: RE | Admit: 2022-05-11 | Discharge: 2022-05-11 | Disposition: A | Discharge status: Home / Self Care | Payer: Medicaid Other | Source: Ambulatory Visit | Attending: Advanced Practice Midwife | Admitting: Advanced Practice Midwife

## 2022-05-11 ENCOUNTER — Ambulatory Visit (INDEPENDENT_AMBULATORY_CARE_PROVIDER_SITE_OTHER): Payer: Medicaid Other | Admitting: Advanced Practice Midwife

## 2022-05-11 VITALS — BP 106/69 | HR 91 | Wt 131.0 lb

## 2022-05-11 DIAGNOSIS — Z3A36 36 weeks gestation of pregnancy: Secondary | ICD-10-CM | POA: Insufficient documentation

## 2022-05-11 DIAGNOSIS — O0990 Supervision of high risk pregnancy, unspecified, unspecified trimester: Secondary | ICD-10-CM | POA: Insufficient documentation

## 2022-05-11 DIAGNOSIS — Z3A38 38 weeks gestation of pregnancy: Secondary | ICD-10-CM

## 2022-05-11 DIAGNOSIS — O365930 Maternal care for other known or suspected poor fetal growth, third trimester, not applicable or unspecified: Secondary | ICD-10-CM | POA: Diagnosis not present

## 2022-05-11 DIAGNOSIS — O0993 Supervision of high risk pregnancy, unspecified, third trimester: Secondary | ICD-10-CM

## 2022-05-11 NOTE — Progress Notes (Signed)
   PRENATAL VISIT NOTE  Subjective:  [name] is a 32 y.o. [id number] at 84w4dbeing seen today for ongoing prenatal care.  She is currently monitored for the following issues for this high-risk pregnancy and has Supervision of low-risk pregnancy; History of pre-term labor; and Fetal growth restriction antepartum on their problem list.  Patient reports no complaints.  Contractions: Irregular. Vag. Bleeding: None.  Movement: Present. Denies leaking of fluid.   The following portions of the patient's history were reviewed and updated as appropriate: allergies, current medications, past family history, past medical history, past social history, past surgical history and problem list.   Objective:   Vitals:   05/11/22 1610  BP: 106/69  Pulse: 91  Weight: 131 lb (59.4 kg)    Fetal Status:   Fundal Height: 37 cm Movement: Present  Presentation: Vertex  General:  Alert, oriented and cooperative. Patient is in no acute distress.  Skin: Skin is warm and dry. No rash noted.   Cardiovascular: Normal heart rate noted  Respiratory: Normal respiratory effort, no problems with respiration noted  Abdomen: Soft, gravid, appropriate for gestational age.  Pain/Pressure: Present     Pelvic: Cervical exam performed in the presence of a chaperone Dilation: 4 Effacement (%): 50 Station: -2  Extremities: Normal range of motion.  Edema: None  Mental Status: Normal mood and affect. Normal behavior. Normal judgment and thought content.   Assessment and Plan:  Pregnancy: [id number] [redacted]w[redacted]d. Supervision of high risk pregnancy, antepartum   2. [redacted] weeks gestation of pregnancy - GC/Chlamydia probe amp [facility], beta strep (group b only)  3. Poor fetal growth affecting management of mother in third trimester, single or unspecified fetus - IOL on 3/8 at 3975 weekser MFM - IOL orders placed   Term labor symptoms and general obstetric precautions including but not limited to  vaginal bleeding, contractions, leaking of fluid and fetal movement were reviewed in detail with the patient. Please refer to After Visit Summary for other counseling recommendations.   No follow-ups on file.  Future Appointments  Date Time Provider DeHarmony3/10/2022  6:30 AM MC-LD SCVictorC-INDC None  05/18/2022  4:15 PM DuRadene GunningMD [facility]3/19/2024  3:55 PM HoTresea MallCNM [facility]3/26/2024  9:15 AM AjDarliss CheneyMD WMMercy Franklin CenterMEast EndNP, CNM  05/11/22  4:24 PM

## 2022-05-12 ENCOUNTER — Telehealth (HOSPITAL_COMMUNITY): Payer: Self-pay | Admitting: *Deleted

## 2022-05-12 ENCOUNTER — Encounter (HOSPITAL_COMMUNITY): Payer: Self-pay | Admitting: *Deleted

## 2022-05-12 ENCOUNTER — Other Ambulatory Visit: Payer: Self-pay | Admitting: Advanced Practice Midwife

## 2022-05-12 ENCOUNTER — Ambulatory Visit: Payer: Medicaid Other

## 2022-05-12 ENCOUNTER — Encounter: Payer: Self-pay | Admitting: *Deleted

## 2022-05-12 LAB — GC/CHLAMYDIA PROBE AMP (~~LOC~~) NOT AT ARMC
Chlamydia: NEGATIVE
Comment: NEGATIVE
Comment: NORMAL
Neisseria Gonorrhea: NEGATIVE

## 2022-05-12 NOTE — Telephone Encounter (Signed)
Preadmission screen  

## 2022-05-14 ENCOUNTER — Encounter (HOSPITAL_COMMUNITY): Payer: Self-pay | Admitting: Family Medicine

## 2022-05-14 ENCOUNTER — Inpatient Hospital Stay (HOSPITAL_COMMUNITY): Payer: Medicaid Other

## 2022-05-14 ENCOUNTER — Other Ambulatory Visit: Payer: Self-pay

## 2022-05-14 ENCOUNTER — Inpatient Hospital Stay (HOSPITAL_COMMUNITY)
Admission: RE | Admit: 2022-05-14 | Discharge: 2022-05-16 | DRG: 807 | Disposition: A | Discharge status: Home / Self Care | Payer: Medicaid Other | Attending: Obstetrics and Gynecology | Admitting: Obstetrics and Gynecology

## 2022-05-14 DIAGNOSIS — O365930 Maternal care for other known or suspected poor fetal growth, third trimester, not applicable or unspecified: Principal | ICD-10-CM | POA: Diagnosis present

## 2022-05-14 DIAGNOSIS — O0990 Supervision of high risk pregnancy, unspecified, unspecified trimester: Secondary | ICD-10-CM

## 2022-05-14 DIAGNOSIS — Z3490 Encounter for supervision of normal pregnancy, unspecified, unspecified trimester: Secondary | ICD-10-CM

## 2022-05-14 DIAGNOSIS — Z3A39 39 weeks gestation of pregnancy: Secondary | ICD-10-CM

## 2022-05-14 DIAGNOSIS — Z3A36 36 weeks gestation of pregnancy: Secondary | ICD-10-CM

## 2022-05-14 DIAGNOSIS — O365990 Maternal care for other known or suspected poor fetal growth, unspecified trimester, not applicable or unspecified: Secondary | ICD-10-CM | POA: Diagnosis present

## 2022-05-14 LAB — TYPE AND SCREEN
ABO/RH(D): AB POS
Antibody Screen: NEGATIVE

## 2022-05-14 LAB — CBC
HCT: 35.9 % — ABNORMAL LOW (ref 36.0–46.0)
Hemoglobin: 11 g/dL — ABNORMAL LOW (ref 12.0–15.0)
MCH: 24.8 pg — ABNORMAL LOW (ref 26.0–34.0)
MCHC: 30.6 g/dL (ref 30.0–36.0)
MCV: 80.9 fL (ref 80.0–100.0)
Platelets: 319 10*3/uL (ref 150–400)
RBC: 4.44 MIL/uL (ref 3.87–5.11)
RDW: 14.5 % (ref 11.5–15.5)
WBC: 7 10*3/uL (ref 4.0–10.5)
nRBC: 0 % (ref 0.0–0.2)

## 2022-05-14 LAB — RPR: RPR Ser Ql: NONREACTIVE

## 2022-05-14 MED ORDER — ZOLPIDEM TARTRATE 5 MG PO TABS: 5.0000 mg | ORAL_TABLET | Freq: Every evening | ORAL | Status: DC | PRN

## 2022-05-14 MED ORDER — SOD CITRATE-CITRIC ACID 500-334 MG/5ML PO SOLN: 30.0000 mL | ORAL | Status: DC | PRN

## 2022-05-14 MED ORDER — ONDANSETRON HCL 4 MG/2ML IJ SOLN: 4.0000 mg | Freq: Four times a day (QID) | INTRAMUSCULAR | Status: DC | PRN

## 2022-05-14 MED ORDER — ACETAMINOPHEN 325 MG PO TABS
650.0000 mg | ORAL_TABLET | ORAL | Status: DC | PRN
  Administered 2022-05-14 – 2022-05-16 (×7): 650 mg via ORAL
  Filled 2022-05-14 (×7): qty 2

## 2022-05-14 MED ORDER — FENTANYL CITRATE (PF) 100 MCG/2ML IJ SOLN
100.0000 ug | INTRAMUSCULAR | Status: DC | PRN
  Administered 2022-05-14: 100 ug via INTRAVENOUS
  Filled 2022-05-14: qty 2

## 2022-05-14 MED ORDER — OXYTOCIN-SODIUM CHLORIDE 30-0.9 UT/500ML-% IV SOLN
2.5000 [IU]/h | INTRAVENOUS | Status: DC
  Administered 2022-05-14: 2.5 [IU]/h via INTRAVENOUS
  Filled 2022-05-14: qty 500

## 2022-05-14 MED ORDER — IBUPROFEN 600 MG PO TABS
600.0000 mg | ORAL_TABLET | Freq: Four times a day (QID) | ORAL | Status: DC
  Administered 2022-05-14 – 2022-05-16 (×8): 600 mg via ORAL
  Filled 2022-05-14 (×8): qty 1

## 2022-05-14 MED ORDER — OXYCODONE-ACETAMINOPHEN 5-325 MG PO TABS: 2.0000 | ORAL_TABLET | ORAL | Status: DC | PRN

## 2022-05-14 MED ORDER — COCONUT OIL OIL: 1.0000 | TOPICAL_OIL | Status: DC | PRN

## 2022-05-14 MED ORDER — OXYTOCIN-SODIUM CHLORIDE 30-0.9 UT/500ML-% IV SOLN: 1.0000 m[IU]/min | INTRAVENOUS | Status: DC

## 2022-05-14 MED ORDER — ONDANSETRON HCL 4 MG/2ML IJ SOLN: 4.0000 mg | INTRAMUSCULAR | Status: DC | PRN

## 2022-05-14 MED ORDER — PRENATAL MULTIVITAMIN CH
1.0000 | ORAL_TABLET | Freq: Every day | ORAL | Status: DC
  Administered 2022-05-15 – 2022-05-16 (×2): 1 via ORAL
  Filled 2022-05-14 (×2): qty 1

## 2022-05-14 MED ORDER — OXYCODONE-ACETAMINOPHEN 5-325 MG PO TABS
1.0000 | ORAL_TABLET | ORAL | Status: DC | PRN
  Administered 2022-05-14: 1 via ORAL
  Filled 2022-05-14: qty 1

## 2022-05-14 MED ORDER — DIPHENHYDRAMINE HCL 25 MG PO CAPS: 25.0000 mg | ORAL_CAPSULE | Freq: Four times a day (QID) | ORAL | Status: DC | PRN

## 2022-05-14 MED ORDER — WITCH HAZEL-GLYCERIN EX PADS: 1.0000 | MEDICATED_PAD | CUTANEOUS | Status: DC | PRN

## 2022-05-14 MED ORDER — SENNOSIDES-DOCUSATE SODIUM 8.6-50 MG PO TABS
2.0000 | ORAL_TABLET | Freq: Every day | ORAL | Status: DC
  Administered 2022-05-15 – 2022-05-16 (×2): 2 via ORAL
  Filled 2022-05-14 (×2): qty 2

## 2022-05-14 MED ORDER — ONDANSETRON HCL 4 MG PO TABS: 4.0000 mg | ORAL_TABLET | ORAL | Status: DC | PRN

## 2022-05-14 MED ORDER — SIMETHICONE 80 MG PO CHEW: 80.0000 mg | CHEWABLE_TABLET | ORAL | Status: DC | PRN

## 2022-05-14 MED ORDER — BENZOCAINE-MENTHOL 20-0.5 % EX AERO
1.0000 | INHALATION_SPRAY | CUTANEOUS | Status: DC | PRN
  Administered 2022-05-14 – 2022-05-15 (×2): 1 via TOPICAL
  Filled 2022-05-14 (×2): qty 56

## 2022-05-14 MED ORDER — LIDOCAINE HCL (PF) 1 % IJ SOLN: 30.0000 mL | INTRAMUSCULAR | Status: DC | PRN

## 2022-05-14 MED ORDER — TERBUTALINE SULFATE 1 MG/ML IJ SOLN: 0.2500 mg | Freq: Once | INTRAMUSCULAR | Status: DC | PRN

## 2022-05-14 MED ORDER — OXYTOCIN BOLUS FROM INFUSION
333.0000 mL | Freq: Once | INTRAVENOUS | Status: AC
  Administered 2022-05-14: 333 mL via INTRAVENOUS

## 2022-05-14 MED ORDER — TETANUS-DIPHTH-ACELL PERTUSSIS 5-2.5-18.5 LF-MCG/0.5 IM SUSY: 0.5000 mL | PREFILLED_SYRINGE | Freq: Once | INTRAMUSCULAR | Status: DC

## 2022-05-14 MED ORDER — ACETAMINOPHEN 325 MG PO TABS: 650.0000 mg | ORAL_TABLET | ORAL | Status: DC | PRN

## 2022-05-14 MED ORDER — DIBUCAINE (PERIANAL) 1 % EX OINT: 1.0000 | TOPICAL_OINTMENT | CUTANEOUS | Status: DC | PRN

## 2022-05-14 MED ORDER — MEDROXYPROGESTERONE ACETATE 150 MG/ML IM SUSP: 150.0000 mg | Freq: Once | INTRAMUSCULAR | Status: DC

## 2022-05-14 MED ORDER — LACTATED RINGERS IV SOLN: INTRAVENOUS | Status: DC

## 2022-05-14 MED ORDER — LACTATED RINGERS IV SOLN: 500.0000 mL | INTRAVENOUS | Status: DC | PRN

## 2022-05-14 NOTE — Progress Notes (Signed)
[name] is a 32 y.o. [phone] at 8w0dadmitted for induction of labor due to Poor fetal growth.  Subjective: Called to delivery by bedside RN. Patient very uncomfortable with contractions and feeling the urge to push. Nitrous oxide started.  Objective: BP 106/70   Pulse 77   Temp 97.9 F (36.6 C) (Oral)   Resp 16   Ht '5\' 3"'$  (1.6 m)   Wt 59.3 kg   LMP  (LMP Unknown) Comment: Depo birth control; periods irregular  SpO2 100%   BMI 23.15 kg/m  No intake/output data recorded. Total I/O In: -  Out: 150[Blood:13]  FHT:  FHR: 125 bpm, variability: moderate,  accelerations:  Present,  decelerations:  Present early variables UC:   regular, every 1.5-4 minutes SVE:   Dilation: Lip/rim Effacement (%): 90 Station: 0 Exam by:: RLaury Deep CNM  Labs: Lab Results  Component Value Date   WBC 7.0 05/14/2022   HGB 11.0 (L) 05/14/2022   HCT 35.9 (L) 05/14/2022   MCV 80.9 05/14/2022   PLT 319 05/14/2022    Assessment / Plan: Induction of labor due to IUGR,  progressing well s/p AROM  Labor: Progressing normally Preeclampsia:   n/a Fetal Wellbeing:  Category I Pain Control:  Nitrous Oxide - patient self-administration of nitrous oxide abruptly stopped, patient unresponsive to verbal/physical prompts. VS WNL, O2 sats 100% on RA. Firm sternal rubs elicit mild reaction from patient, but patient remains in nearly unconscious state. Bed placed in a flat position, ammonia held under nostrils which aroused patient. Patient then awakened to say "Guys, I need to push." Bed repositioned and patient prepped for delivery.  I/D:   GBS pending Anticipated MOD:  NSVD  RLaury Deep CNM 05/14/2022, 1:36 PM

## 2022-05-14 NOTE — H&P (Signed)
OBSTETRIC ADMISSION HISTORY AND PHYSICAL  [name] is a 32 y.o. female [phone] with IUP at 51w0dby U/S presenting for IOL for IUGR.  Reports fetal movement. Denies vaginal bleeding.  [facility]  Support person in labor: No one at the time of admission  FOB to arrive after 1400 today  Ultrasounds Anatomy U/S: Normal  IUGR - EFW 2599 gm 7% on 05/06/2022  Prenatal History/Complications: IUGR H/O PTB x 3  OB BOX:  Nursing Staff Provider  Office Location [facility] Dating  05/21/2022, by Ultrasound  [facility] HospitalModel '[x]'$  Traditional '[ ]'$  Centering '[ ]'$  Mom-Baby Dyad Anatomy [place] IUGR>>EFW 2599 gm 7% (on 2/29)  Language  English    Flu Vaccine  12/07/21 Genetic/Carrier Screen  NIPS:   LR female AFP:   NEG CF: 08/16/18 neg, SMA+Hgb: 04/04/17 neg  TDaP Vaccine   03/16/2022 Hgb A1C or  GTT Early 5.1 Third trimester Normal 1hr GTT  COVID Vaccine No   LAB RESULTS   Rhogam  AB/Positive/-- (10/02 1015)  Blood Type AB/Positive/-- (10/02 1015)  Baby Feeding Plan Both Antibody Negative (10/02 1015)   Contraception Depo Rubella 1.26 (10/02 1015) IMMUNE  Circumcision Yes RPR Non Reactive (01/09 1415)   Pediatrician  [facility] HBsAg Negative (10/02 1015)  Support Person Devin Scales (partner) HCVAb Non Reactive (10/02 1015)   Prenatal Classes  HIV Non Reactive (01/09 1415)    BTL Consent NA GBS Pending (For PCN allergy, check sensitivities)   VBAC Consent NA Pap Diagnosis  Date Value Ref Range Status  12/07/2021   Final   - Negative for intraepithelial lesion or malignancy (NILM)         DME Rx '[x]'$  BP cuff '[x]'$  Weight Scale PHQ9 & GAD7 '[x]'$  new OB [  ] 28 weeks  [  ] 36 weeks Induction  '[ ]'$  Orders Entered '[ ]'$ Foley Y/N   Past Medical History: Past Medical History:  Diagnosis Date   Anemia    iron    History of preterm delivery, currently pregnant 08/08/2018   She used 17 P with last pregnancy and will do it again with this one.   History  of preterm delivery, currently pregnant, unspecified trimester 04/04/2017    [facility]   No pertinent past medical history    Preterm delivery    Stillbirth    Supervision of high risk pregnancy, antepartum 08/08/2018    Nursing Staff Provider Office Location  [facility]   Language   Egnlis Anatomy [place] scheduled Flu Vaccine   Genetic Screen  NIPS: low risk female  TDaP vaccine    Hgb A1C or  GTT  Third trimester  Rhogam   n/a   LAB RESULTS  Feeding Plan Breast Blood Type --/--/AB POS, AB POS [facility] [address], [place] [address] [phone] AB+ Contraception  BTL Antib   UTI (lower urinary tract infection)    Vaginal wall cyst 01/02/2015   See photo in note from 01/01/15     Past Surgical History: Past Surgical History:  Procedure Laterality Date   NO PAST SURGERIES      Obstetrical History: OB History     Gravida  7   Para  5   Term  2   Preterm  3   AB  1   Living  5      SAB  1  IAB  0   Ectopic  0   Multiple  0   Live Births  5           Social History: Social History   Socioeconomic History   Marital status: Single    Spouse name: Not on file   Number of children: Not on file   Years of education: Not on file   Highest education level: Not on file  Occupational History    Employer: [employer]  Tobacco Use   Smoking status: Never   Smokeless tobacco: Never  Vaping Use   Vaping Use: Never used  Substance and Sexual Activity   Alcohol use: No   Drug use: No   Sexual activity: Not Currently    Birth control/protection: Injection    Comment: last sex a few weeks ago  Other Topics Concern   Not on file  Social History Narrative   Not on file   Social Determinants of Health   Financial Resource Strain: Not on file  Food Insecurity: No Food Insecurity (05/14/2022)   Hunger Vital Sign    Worried About Running Out of Food in the Last Year: Never true    Ran  Out of Food in the Last Year: Never true  Transportation Needs: No Transportation Needs (05/14/2022)   PRAPARE - Hydrologist (Medical): No    Lack of Transportation (Non-Medical): No  Physical Activity: Not on file  Stress: Not on file  Social Connections: Not on file    Family History: Family History  Problem Relation Age of Onset   Hypertension Mother    Cancer Sister    Asthma Son    Diabetes Neg Hx    Heart disease Neg Hx    Stroke Neg Hx     Allergies: Allergies  Allergen Reactions   Cherry Hives and Itching    Medications Prior to Admission  Medication Sig Dispense Refill Last Dose   cyclobenzaprine (FLEXERIL) 10 MG tablet Take 1 tablet (10 mg total) by mouth 2 (two) times daily as needed for muscle spasms. 20 tablet 0 Past Week   ferrous sulfate 325 (65 FE) MG tablet Take 325 mg by mouth daily with breakfast.   05/14/2022   NIFEdipine (PROCARDIA-XL/NIFEDICAL-XL) 30 MG 24 hr tablet Take 1 tablet (30 mg total) by mouth daily. Can increase to twice a day as needed for symptomatic contractions 30 tablet 2 Past Week   Prenatal Vit-Fe Fumarate-FA (PRENATAL MULTIVITAMIN) TABS tablet Take 1 tablet by mouth daily at 12 noon.   05/14/2022   metoCLOPramide (REGLAN) 10 MG tablet Take 1 tablet (10 mg total) by mouth 4 (four) times daily. (Patient not taking: Reported on 03/25/2022) 30 tablet 1    promethazine (PHENERGAN) 25 MG tablet Take 1 tablet (25 mg total) by mouth every 6 (six) hours as needed for nausea or vomiting. (Patient not taking: Reported on 04/08/2022) 30 tablet 1      Review of Systems  All systems reviewed and negative except as stated in HPI  Blood pressure 105/71, pulse 81, temperature 97.9 F (36.6 C), temperature source Oral, resp. rate 14, height '5\' 3"'$  (1.6 m), weight 59.3 kg. General appearance: alert, cooperative, and no distress Lungs: no respiratory distress Heart: regular rate  Abdomen: soft, non-tender; gravid Pelvic:  adequate Extremities: Homans sign is negative, no sign of DVT Presentation: cephalic Fetal monitoring: Baseline rate 140 bpm   Variability moderate  Accelerations present   Decelerations variable Uterine activity: irregular  every 8-10 mins  Dilation: 4 Effacement (%): 80 Station: -1 Exam by:: Renato Battles, CNM  AROM Procedure: Verbal consent to proceed with AROM after discussion of r/b and active management of labor. Large amount of clear fluid in return. Patient verbalized an understanding of the plan of care and agrees. Patient tolerated the procedure well.  Prenatal labs: ABO, Rh: AB/Positive/-- (10/02 1015) Antibody: Negative (10/02 1015) Rubella: 1.26 (10/02 1015) IMMUNE RPR: Non Reactive (01/09 1415)  HBsAg: Negative (10/02 1015)  HIV: Non Reactive (01/09 1415)  GBS:  Pending Glucola: Normal 1 hr GTT = 125 Genetic screening:  Low-Risk  Prenatal Transfer Tool  Maternal Diabetes: No Genetic Screening: Normal Maternal Ultrasounds/Referrals: IUGR Fetal Ultrasounds or other Referrals:  Referred to Materal Fetal Medicine  Maternal Substance Abuse:  No Significant Maternal Medications:  None Significant Maternal Lab Results: Other: GBS Unknown  h/o Negative in last pregnancy  Results for orders placed or performed during the hospital encounter of 05/14/22 (from the past 24 hour(s))  CBC   Collection Time: 05/14/22  8:39 AM  Result Value Ref Range   WBC 7.0 4.0 - 10.5 K/uL   RBC 4.44 3.87 - 5.11 MIL/uL   Hemoglobin 11.0 (L) 12.0 - 15.0 g/dL   HCT 35.9 (L) 36.0 - 46.0 %   MCV 80.9 80.0 - 100.0 fL   MCH 24.8 (L) 26.0 - 34.0 pg   MCHC 30.6 30.0 - 36.0 g/dL   RDW 14.5 11.5 - 15.5 %   Platelets 319 150 - 400 K/uL   nRBC 0.0 0.0 - 0.2 %    Patient Active Problem List   Diagnosis Date Noted   IUGR (intrauterine growth restriction) affecting care of mother 05/14/2022   Fetal growth restriction antepartum 04/13/2022   Supervision of low-risk pregnancy 12/07/2021   History of  pre-term labor 12/07/2021    Assessment/Plan:  [name] is a 32 y.o. [id number] at 95w0dhere for IOL for IUGR  Labor: IOL -- pain control: planning no interventions -- consult with Dr. WSi Raiderre: AROM with pending GBS results>>ok to proceed with POC -- AROM @ 0912  Fetal Wellbeing: EFW 2599 gm 7% by U/S on 05/06/2022. Cephalic by SVE.  -- GBS (unknown>>results pending) -- continuous fetal monitoring - Category 1   Postpartum Planning -- breast/bottle -- Depo for contraception    RLaury Deep CNM  05/14/2022, 8:58 AM

## 2022-05-14 NOTE — Discharge Summary (Signed)
Postpartum Discharge Summary  Date of Service updated***     Patient Name: [name] DOB: 05-03-1990 MRN: [id number]  Date of admission: 05/14/2022 Delivery date:05/14/2022  Delivering provider: Laury Deep  Date of discharge: 05/14/2022  Admitting diagnosis: IUGR (intrauterine growth restriction) affecting care of mother [O36.5990] Intrauterine pregnancy: [redacted]w[redacted]d    Secondary diagnosis:  Principal Problem:   IUGR (intrauterine growth restriction) affecting care of mother  Additional problems: ***    Discharge diagnosis: Term Pregnancy Delivered and IUGR                                               Post partum procedures:{Postpartum procedures:23558} Augmentation: AROM Complications: None  Hospital course: Onset of Labor With Vaginal Delivery      32y.o. yo [id number] 351w0das admitted in Latent Labor on 05/14/2022. Labor course was complicated by none  Membrane Rupture Time/Date: 9:12 AM ,05/14/2022   Delivery Method:Vaginal, Spontaneous  Episiotomy: None  Lacerations:  None  Patient had a postpartum course complicated by ***.  She is ambulating, tolerating a regular diet, passing flatus, and urinating well. Patient is discharged home in stable condition on 05/14/22.  Newborn Data: Birth date:05/14/2022  Birth time:1:47 PM  Gender:Female  Living status:Living  Apgars:8 ,8  Weight:   Magnesium Sulfate received: No BMZ received: No Rhophylac:N/A MMR:N/A T-DaP:Given prenatally - 03/16/2022 Flu: Yes - 12/07/2021 Transfusion:{Transfusion received:30440034}  Physical exam  Vitals:   05/14/22 1415 05/14/22 1416 05/14/22 1425 05/14/22 1431  BP:  106/70  110/68  Pulse:  77  76  Resp:      Temp:      TempSrc:      SpO2: 100%  100%   Weight:      Height:       General: {Exam; general:21111117} Lochia: {Desc; appropriate/inappropriate:30686::"appropriate"} Uterine Fundus: {Desc; firm/soft:30687} Incision: {Exam; incision:21111123} DVT Evaluation: {Exam;  dvt:2111122} Labs: Lab Results  Component Value Date   WBC 7.0 05/14/2022   HGB 11.0 (L) 05/14/2022   HCT 35.9 (L) 05/14/2022   MCV 80.9 05/14/2022   PLT 319 05/14/2022      Latest Ref Rng & Units 08/24/2016   10:43 PM  CMP  Glucose 65 - 99 mg/dL 100   BUN 6 - 20 mg/dL 10   Creatinine 0.44 - 1.00 mg/dL 0.73   Sodium 135 - 145 mmol/L 137   Potassium 3.5 - 5.1 mmol/L 3.6   Chloride 101 - 111 mmol/L 106   CO2 22 - 32 mmol/L 23   Calcium 8.9 - 10.3 mg/dL 8.8   Total Protein 6.5 - 8.1 g/dL 7.4   Total Bilirubin 0.3 - 1.2 mg/dL 1.8   Alkaline Phos 38 - 126 U/L 49   AST 15 - 41 U/L 19   ALT 14 - 54 U/L 13    Edinburgh Score:    01/03/2019    2:35 PM  Edinburgh Postnatal Depression Scale Screening Tool  I have been able to laugh and see the funny side of things. 0  I have looked forward with enjoyment to things. 0  I have blamed myself unnecessarily when things went wrong. 0  I have been anxious or worried for no good reason. 0  I have felt scared or panicky for no good reason. 0  Things have been getting on top of me. 0  I have been  so unhappy that I have had difficulty sleeping. 0  I have felt sad or miserable. 0  I have been so unhappy that I have been crying. 0  The thought of harming myself has occurred to me. 0  Edinburgh Postnatal Depression Scale Total 0     After visit meds:  Allergies as of 05/14/2022       Reactions   Cherry Hives, Itching     Med Rec must be completed prior to using this [facility]***       Discharge home in stable condition Infant Feeding: Bottle and Breast Infant Disposition:{CHL IP OB HOME WITH DX:[id number] Discharge instruction: per After Visit Summary and Postpartum booklet. Activity: Advance as tolerated. Pelvic rest for 6 weeks.  Diet: routine diet Future Appointments: Future Appointments  Date Time Provider Spry  06/21/2022  2:55 PM Tresea Mall, CNM [facility]   Follow up Visit:  [facility]. Go in 4 week(s).   Specialty: Obstetrics and Gynecology Why: postpartum visit Contact information: [place] [phone]                Message sent to [facility] by R. Renato Battles, CNM on 05/14/2022 Please schedule this patient for a In person postpartum visit in 4 weeks with the following provider: Any provider. Additional Postpartum F/U: none   Low risk pregnancy complicated by:  IUGR Delivery mode:  Vaginal, Spontaneous  Anticipated Birth Control:  PP Depo given   05/14/2022 Laury Deep, CNM

## 2022-05-14 NOTE — Lactation Note (Signed)
This note was copied from a baby's chart. Lactation Consultation Note  Patient Name: [name] Today's Date: 05/14/2022 Age:30 hours  Mom is BF and formula. Declines the need for Lactation services.   Maternal Data    Feeding Nipple Type: Slow - flow  LATCH Score                    Lactation Tools Discussed/Used    Interventions    Discharge    Consult Status Consult Status: Complete    Alesandro Stueve G 05/14/2022, 7:12 PM

## 2022-05-15 LAB — CBC
HCT: 31 % — ABNORMAL LOW (ref 36.0–46.0)
Hemoglobin: 9.7 g/dL — ABNORMAL LOW (ref 12.0–15.0)
MCH: 25.4 pg — ABNORMAL LOW (ref 26.0–34.0)
MCHC: 31.3 g/dL (ref 30.0–36.0)
MCV: 81.2 fL (ref 80.0–100.0)
Platelets: 266 10*3/uL (ref 150–400)
RBC: 3.82 MIL/uL — ABNORMAL LOW (ref 3.87–5.11)
RDW: 14.6 % (ref 11.5–15.5)
WBC: 9.9 10*3/uL (ref 4.0–10.5)
nRBC: 0 % (ref 0.0–0.2)

## 2022-05-15 NOTE — Progress Notes (Signed)
POSTPARTUM PROGRESS NOTE  Subjective: [name] is a 32 y.o. [id number] s/p SVD at [redacted]w[redacted]d  She reports she doing well. No acute events overnight. She denies any problems with ambulating, voiding or po intake. Denies nausea or vomiting. She has passed flatus. Pain is well controlled.  Lochia is improving and appropriate.  Objective: Blood pressure (!) 96/56, pulse 75, temperature 98 F (36.7 C), temperature source Oral, resp. rate 16, height '5\' 3"'$  (1.6 m), weight 59.3 kg, SpO2 100 %, unknown if currently breastfeeding.  Physical Exam:  General: alert, cooperative and no distress Chest: no respiratory distress Abdomen: soft, non-tender  Uterine Fundus: firm and at level of umbilicus Extremities: No calf swelling or tenderness  BL LE symmetric, nonerythematous  Recent Labs    05/14/22 0839 05/15/22 0443  HGB 11.0* 9.7*  HCT 35.9* 31.0*    Assessment/Plan: Mackenzie Murrahis a 32y.o. [id number]/p SVD at 333w0dor IUGR.  Routine Postpartum Care: Doing well, pain well-controlled.  -- Continue routine care, lactation support  -- Contraception: depo -- Feeding: both  Dispo: Plan for discharge tomorrow.  ZhArlyce DiceMDMethowor WoOnyx [facility] 05/15/2022 7:58 AM

## 2022-05-16 LAB — CULTURE, BETA STREP (GROUP B ONLY): Strep Gp B Culture: NEGATIVE

## 2022-05-16 MED ORDER — MEDROXYPROGESTERONE ACETATE 150 MG/ML IM SUSP
150.0000 mg | Freq: Once | INTRAMUSCULAR | Status: AC
  Administered 2022-05-16: 150 mg via INTRAMUSCULAR
  Filled 2022-05-16: qty 1

## 2022-05-16 MED ORDER — FERROUS SULFATE 325 (65 FE) MG PO TABS
325.0000 mg | ORAL_TABLET | Freq: Every day | ORAL | Status: DC
  Administered 2022-05-16: 325 mg via ORAL
  Filled 2022-05-16: qty 1

## 2022-05-16 NOTE — Discharge Instructions (Signed)
-   Continue your prenatal vitamins especially if breastfeeding - Try to eat iron rich food. - Take over the counter tylenol (500mg) or ibuprofen (200mg) three times a day as needed for cramping/pain. - follow up in clinic in 4-6 weeks as scheduled for your regular post partum visit. - Please come back to [facility] if you notice persistently elevated blood pressures or you start to have a headache, that doesn't get better with medications (tylenol and ibuprofen), rest (4hrs of sleep) and drinking water.  

## 2022-05-18 ENCOUNTER — Encounter: Payer: Self-pay | Admitting: Obstetrics and Gynecology

## 2022-05-25 ENCOUNTER — Encounter: Payer: Self-pay | Admitting: Advanced Practice Midwife

## 2022-05-26 ENCOUNTER — Telehealth (HOSPITAL_COMMUNITY): Payer: Self-pay | Admitting: *Deleted

## 2022-05-26 NOTE — Telephone Encounter (Signed)
Left phone voicemail message.  Odis Hollingshead, RN [phone] at 10:24am

## 2022-05-31 ENCOUNTER — Telehealth (HOSPITAL_COMMUNITY): Payer: Self-pay

## 2022-05-31 NOTE — Telephone Encounter (Signed)
Manual Entry-See Prenatal record

## 2022-06-01 ENCOUNTER — Encounter: Payer: Self-pay | Admitting: Obstetrics and Gynecology

## 2022-06-21 ENCOUNTER — Ambulatory Visit: Payer: Medicaid Other | Admitting: Advanced Practice Midwife

## 2022-06-21 ENCOUNTER — Ambulatory Visit
Admission: EM | Admit: 2022-06-21 | Discharge: 2022-06-21 | Disposition: A | Discharge status: Home / Self Care | Payer: Medicaid Other | Attending: Family Medicine | Admitting: Family Medicine

## 2022-06-21 DIAGNOSIS — H1031 Unspecified acute conjunctivitis, right eye: Secondary | ICD-10-CM

## 2022-06-21 MED ORDER — GENTAMICIN SULFATE 0.3 % OP SOLN: 2.0000 [drp] | Freq: Three times a day (TID) | OPHTHALMIC | 0 refills | Status: AC

## 2022-06-21 NOTE — Discharge Instructions (Signed)
Put gentamicin eyedrops in the affected eye(s) 3 times daily for 5 days.   Cool compresses can make the eye feel better.

## 2022-06-21 NOTE — ED Triage Notes (Signed)
Pt c/o itchy right conjunctivitis onset ~ yesterday

## 2022-06-21 NOTE — ED Provider Notes (Signed)
[facility]    CSN: [id number] Arrival date & time: 06/21/22  1503      History   Chief Complaint Chief Complaint  Patient presents with   Conjunctivitis    HPI [name] is a 32 y.o. female.    Conjunctivitis   here for irritation and redness in her right eye.  It started yesterday, and when she awoke this morning she had lots of mattering and discharge in her right eye.  No cough or congestion.  She was exposed to her 80-year-old son who had similar symptoms in the last week.  Her infant son was delivered about 6 weeks ago.  Past Medical History:  Diagnosis Date   Anemia    iron    History of preterm delivery, currently pregnant 08/08/2018   She used 17 P with last pregnancy and will do it again with this one.   History of preterm delivery, currently pregnant, unspecified trimester 04/04/2017    too late for [facility], started prometrium   No pertinent past medical history    Preterm delivery    Stillbirth    Supervision of high risk pregnancy, antepartum 08/08/2018    Nursing Staff Provider Office Location  [facility] Dating   Language   Egnlis An[employer] US  scheduled Flu Vaccine   Genetic Screen  NIPS: low risk female  TDaP vaccine    Hgb A1C or  GTT  Third trimester  Rhogam   n/a   LAB RESULTS  Feeding Plan Breast Blood Type --/--/AB POS, AB POS Performed at [facility], [address], [place], [place] [address]  [phone]) AB+ Contraception  BTL Antib   UTI (lower urinary tract infection)    Vaginal wall cyst 01/02/2015   See photo in note from 01/01/15     Patient Active Problem List   Diagnosis Date Noted   Vaginal delivery 05/16/2022   IUGR (intrauterine growth restriction) affecting care of mother 05/14/2022   Fetal growth restriction antepartum 04/13/2022   Supervision of low-risk pregnancy 12/07/2021   History of pre-term labor 12/07/2021    Past Surgical History:  Procedure Laterality Date   NO PAST SURGERIES      OB  History     Gravida  7   Para  6   Term  2   Preterm  4   AB  1   Living  6      SAB  1   IAB  0   Ectopic  0   Multiple  0   Live Births  6            Home Medications    Prior to Admission medications   Medication Sig Start Date End Date Taking? Authorizing Provider  gentamicin (GARAMYCIN) 0.3 % ophthalmic solution Place 2 drops into the right eye 3 (three) times daily for 5 days. 06/21/22 06/26/22 Yes Unika Nazareno, Janace Aris, MD  Prenatal Vit-Fe Fumarate-FA (PRENATAL MULTIVITAMIN) TABS tablet Take 1 tablet by mouth daily at 12 noon.    [provider]    Family History Family History  Problem Relation Age of Onset   Hypertension Mother    Cancer Sister    Asthma Son    Diabetes Neg Hx    Heart disease Neg Hx    Stroke Neg Hx     Social History Social History   Tobacco Use   Smoking status: Never   Smokeless tobacco: Never  Vaping Use   Vaping Use: Never  used  Substance Use Topics   Alcohol use: No   Drug use: No     Allergies   Cherry   Review of Systems Review of Systems   Physical Exam Triage Vital Signs ED Triage Vitals  Enc Vitals Group     BP 06/21/22 1528 111/66     Pulse Rate 06/21/22 1528 87     Resp 06/21/22 1528 16     Temp 06/21/22 1528 98.3 F (36.8 C)     Temp Source 06/21/22 1528 Oral     SpO2 06/21/22 1528 97 %     Weight --      Height --      Head Circumference --      Peak Flow --      Pain Score 06/21/22 1529 0     Pain Loc --      Pain Edu? --      Excl. in GC? --    No data found.  Updated Vital Signs BP 111/66 (BP Location: Right Arm)   Pulse 87   Temp 98.3 F (36.8 C) (Oral)   Resp 16   LMP  (LMP Unknown) Comment: Depo birth control; periods irregular  SpO2 97%   Breastfeeding Yes   Visual Acuity Right Eye Distance:   Left Eye Distance:   Bilateral Distance:    Right Eye Near:   Left Eye Near:    Bilateral Near:     Physical Exam Vitals reviewed.  Constitutional:       General: She is not in acute distress.    Appearance: She is not ill-appearing, toxic-appearing or diaphoretic.  HENT:     Mouth/Throat:     Mouth: Mucous membranes are moist.  Eyes:     Extraocular Movements: Extraocular movements intact.     Pupils: Pupils are equal, round, and reactive to light.     Comments: Right eye is injected.  The lids are not swollen.  Left eye is normal  Neurological:     Mental Status: She is alert.      UC Treatments / Results  Labs (all labs ordered are listed, but only abnormal results are displayed) Labs Reviewed - No data to display  EKG   Radiology No results found.  Procedures Procedures (including critical care time)  Medications Ordered in UC Medications - No data to display  Initial Impression / Assessment and Plan / UC Course  I have reviewed the triage vital signs and the nursing notes.  Pertinent labs & imaging results that were available during my care of the patient were reviewed by me and considered in my medical decision making (see chart for details).        Gentamicin eyedrops are sent in to treat the conjunctivitis. Final Clinical Impressions(s) / UC Diagnoses   Final diagnoses:  Acute conjunctivitis of right eye, unspecified acute conjunctivitis type     Discharge Instructions      Put gentamicin eyedrops in the affected eye(s) 3 times daily for 5 days.   Cool compresses can make the eye feel better.     ED Prescriptions     Medication Sig Dispense Auth. Provider   gentamicin (GARAMYCIN) 0.3 % ophthalmic solution Place 2 drops into the right eye 3 (three) times daily for 5 days. 5 mL Zenia Resides, MD      PDMP not reviewed this encounter.   Zenia Resides, MD 06/21/22 [phone]

## 2022-07-15 ENCOUNTER — Encounter: Payer: Self-pay | Admitting: Family Medicine

## 2022-07-15 ENCOUNTER — Other Ambulatory Visit: Payer: Self-pay

## 2022-07-15 ENCOUNTER — Ambulatory Visit (INDEPENDENT_AMBULATORY_CARE_PROVIDER_SITE_OTHER): Payer: Medicaid Other | Admitting: Family Medicine

## 2022-07-15 NOTE — Assessment & Plan Note (Signed)
Doing well with no concerns.  Normal postpartum exam.

## 2022-07-15 NOTE — Progress Notes (Signed)
Post Partum Visit Note  [name] is a 32 y.o. [phone] female who presents for a postpartum visit. She is 8 weeks 6 days postpartum following a normal spontaneous vaginal delivery.  I have fully reviewed the prenatal and intrapartum course. The delivery was at 39 gestational weeks.  Anesthesia:  local, nitrous oxide . Postpartum course has been uncomplicated. Baby is doing well. Baby is feeding by both breast and bottle - Similac 360 Total Care . Bleeding no bleeding. Bowel function is normal. Bladder function is normal. Patient is not sexually active. Contraception method is Depo-Provera injections. Postpartum depression screening: negative.   Upstream - 07/15/22 1118       Pregnancy Intention Screening   Does the patient want to become pregnant in the next year? No    Does the patient's partner want to become pregnant in the next year? No    Would the patient like to discuss contraceptive options today? No      Contraception Wrap Up   Current Method Hormonal Injection    End Method Hormonal Injection            The pregnancy intention screening data noted above was reviewed. Potential methods of contraception were discussed. The patient elected to proceed with Hormonal Injection.   Edinburgh Postnatal Depression Scale - 07/15/22 1119       Edinburgh Postnatal Depression Scale:  In the Past 7 Days   I have been able to laugh and see the funny side of things. 0    I have looked forward with enjoyment to things. 0    I have blamed myself unnecessarily when things went wrong. 0    I have been anxious or worried for no good reason. 0    I have felt scared or panicky for no good reason. 0    Things have been getting on top of me. 0    I have been so unhappy that I have had difficulty sleeping. 0    I have felt sad or miserable. 0    I have been so unhappy that I have been crying. 0    The thought of harming myself has occurred to me. 0    Edinburgh Postnatal Depression  Scale Total 0             Health Maintenance Due  Topic Date Due   COVID-19 Vaccine (1) Never done    The following portions of the patient's history were reviewed and updated as appropriate: allergies, current medications, past family history, past medical history, past social history, past surgical history, and problem list.  Review of Systems Pertinent items are noted in HPI.  Objective:  BP 114/78   Pulse 88   Wt 116 lb 3.2 oz (52.7 kg)   LMP  (LMP Unknown) Comment: Depo birth control; periods irregular  Breastfeeding Yes   BMI 20.58 kg/m    General:  alert, cooperative, and appears stated age   Breasts:  not indicated  Lungs: clear to auscultation bilaterally and normal percussion bilaterally  Heart:  regular rate and rhythm, S1, S2 normal, no murmur, click, rub or gallop  Abdomen: soft, non-tender; bowel sounds normal; no masses,  no organomegaly   GU exam:  not indicated       Assessment:    There are no diagnoses linked to this encounter.  Normal postpartum exam.   Plan:   Essential components of care per ACOG recommendations:  1.  Mood and well being: Patient with  negative depression screening today. Reviewed local resources for support.  - Patient tobacco use? No.   - hx of drug use? No.    2. Infant care and feeding:  -Patient currently breastmilk feeding? Yes. Discussed returning to work and pumping. Reviewed importance of draining breast regularly to support lactation.  -Social determinants of health (SDOH) reviewed in EPIC. No concerns  3. Sexuality, contraception and birth spacing - Patient does not want a pregnancy in the next year.  Desired family size is 6 children.  - Reviewed reproductive life planning. Reviewed contraceptive methods based on pt preferences and effectiveness.  Patient desired Hormonal Injection today.   - Discussed birth spacing of 18 months  4. Sleep and fatigue -Encouraged family/partner/community support of 4 hrs of  uninterrupted sleep to help with mood and fatigue  5. Physical Recovery  - Discussed patients delivery and complications. She describes her labor as good. - Patient had a Vaginal, no problems at delivery.  Perineal healing reviewed. Patient expressed understanding - Patient has urinary incontinence? No. - Patient is safe to resume physical and sexual activity  6.  Health Maintenance - HM due items addressed Yes - Last pap smear  Diagnosis  Date Value Ref Range Status  12/07/2021   Final   - Negative for intraepithelial lesion or malignancy (NILM)   Pap smear not done at today's visit.  -Breast Cancer screening indicated? No.   7. Chronic Disease/Pregnancy Condition follow up: None  - PCP follow up  Celedonio Savage, MD [facility], [facility]

## 2022-07-16 ENCOUNTER — Encounter: Payer: Self-pay | Admitting: Obstetrics & Gynecology

## 2022-07-19 ENCOUNTER — Encounter: Payer: Self-pay | Admitting: General Practice

## 2022-07-19 ENCOUNTER — Encounter: Payer: Self-pay | Admitting: *Deleted

## 2022-08-03 ENCOUNTER — Other Ambulatory Visit: Payer: Self-pay

## 2022-08-03 ENCOUNTER — Ambulatory Visit (INDEPENDENT_AMBULATORY_CARE_PROVIDER_SITE_OTHER): Payer: Medicaid Other

## 2022-08-03 VITALS — BP 98/71 | HR 75 | Ht 62.0 in | Wt 115.6 lb

## 2022-08-03 DIAGNOSIS — Z3042 Encounter for surveillance of injectable contraceptive: Secondary | ICD-10-CM | POA: Diagnosis not present

## 2022-08-03 MED ORDER — MEDROXYPROGESTERONE ACETATE 150 MG/ML IM SUSY
150.0000 mg | PREFILLED_SYRINGE | Freq: Once | INTRAMUSCULAR | Status: AC
  Administered 2022-08-03: 150 mg via INTRAMUSCULAR

## 2022-08-03 NOTE — Progress Notes (Signed)
[name] here for Depo-Provera Injection. Patient received last dose on 05/16/22; patient is [redacted]w[redacted]d since last injection--within window for next dose today. Injection administered without complication. Patient will return in 3 months for next injection between 8/13 and 8/27. Next annual visit due 07/15/22.   Meryl Crutch, RN 08/03/2022  9:44 AM

## 2022-10-20 ENCOUNTER — Ambulatory Visit: Payer: Medicaid Other

## 2022-11-16 ENCOUNTER — Other Ambulatory Visit: Payer: Self-pay

## 2022-11-16 ENCOUNTER — Ambulatory Visit (INDEPENDENT_AMBULATORY_CARE_PROVIDER_SITE_OTHER): Payer: Medicaid Other

## 2022-11-16 VITALS — BP 110/65 | HR 70 | Wt 113.1 lb

## 2022-11-16 DIAGNOSIS — Z3042 Encounter for surveillance of injectable contraceptive: Secondary | ICD-10-CM | POA: Diagnosis not present

## 2022-11-16 LAB — POCT PREGNANCY, URINE: Preg Test, Ur: NEGATIVE

## 2022-11-16 MED ORDER — MEDROXYPROGESTERONE ACETATE 150 MG/ML IM SUSY
150.0000 mg | PREFILLED_SYRINGE | Freq: Once | INTRAMUSCULAR | Status: AC
  Administered 2022-11-16: 150 mg via INTRAMUSCULAR

## 2022-11-16 NOTE — Progress Notes (Signed)
[name] here for Depo-Provera Injection. Last injection given 08/03/22 (15 weeks prior). Will need UPT today for restart; UPT is negative. Pt instructed to use back up form of contraception for next 2 weeks. Injection administered without complication. Patient will return in 3 months for next injection between 02/01/23 and 02/15/23. Next annual visit due May 2025.   Marjo Bicker, RN 11/16/2022  9:51 AM

## 2022-11-29 NOTE — Telephone Encounter (Signed)
Error

## 2022-12-27 ENCOUNTER — Ambulatory Visit
Admission: EM | Admit: 2022-12-27 | Discharge: 2022-12-27 | Disposition: A | Discharge status: Home / Self Care | Payer: Medicaid Other | Attending: Physician Assistant | Admitting: Physician Assistant

## 2022-12-27 ENCOUNTER — Ambulatory Visit: Payer: Medicaid Other

## 2022-12-27 ENCOUNTER — Encounter: Payer: Self-pay | Admitting: Emergency Medicine

## 2022-12-27 ENCOUNTER — Ambulatory Visit (HOSPITAL_COMMUNITY): Payer: Medicaid Other

## 2022-12-27 DIAGNOSIS — Z113 Encounter for screening for infections with a predominantly sexual mode of transmission: Secondary | ICD-10-CM | POA: Insufficient documentation

## 2022-12-27 NOTE — ED Provider Notes (Signed)
[facility]    CSN: [id number] Arrival date & time: 12/27/22  1337      History   Chief Complaint Chief Complaint  Patient presents with   STI testing    HPI [name] is a 32 y.o. female.   Patient here today for STD screening after recently finding out her boyfriend was cheating on her. She has had some vaginal discharge with mild odor but denies any other symptoms. She would like blood work for STD screening as well.   The history is provided by the patient.    Past Medical History:  Diagnosis Date   Anemia    iron    History of preterm delivery, currently pregnant 08/08/2018   She used 17 P with last pregnancy and will do it again with this one.   History of preterm delivery, currently pregnant, unspecified trimester 04/04/2017    too late for [facility], started prometrium   No pertinent past medical history    Preterm delivery    Stillbirth    Supervision of high risk pregnancy, antepartum 08/08/2018    Nursing Staff Provider Office Location  [facility] Dating   Language   Egnlis Anatomy US  scheduled Flu Vaccine   Genetic Screen  NIPS: low risk female  TDaP vaccine    Hgb A1C or  GTT  Third trimester  Rhogam   n/a   LAB RESULTS  Feeding Plan Breast Blood Type --/--/AB POS, AB POS Performed at [facility], [address] [place], [place] [address]  [phone]) AB+ Contraception  BTL Antib   UTI (lower urinary tract infection)    Vaginal wall cyst 01/02/2015   See photo in note from 01/01/15     Patient Active Problem List   Diagnosis Date Noted   Encounter for postpartum visit 07/15/2022   Vaginal delivery 05/16/2022   IUGR (intrauterine growth restriction) affecting care of mother 05/14/2022   Fetal growth restriction antepartum 04/13/2022   Supervision of low-risk pregnancy 12/07/2021   History of pre-term labor 12/07/2021    Past Surgical History:  Procedure Laterality Date   NO PAST SURGERIES      OB History     Gravida  7    Para  6   Term  2   Preterm  4   AB  1   Living  6      SAB  1   IAB  0   Ectopic  0   Multiple  0   Live Births  6            Home Medications    Prior to Admission medications   Medication Sig Start Date End Date Taking? Authorizing Provider  Ferrous Sulfate (IRON PO) Take by mouth daily.   Yes [provider]  Prenatal Vit-Fe Fumarate-FA (PRENATAL MULTIVITAMIN) TABS tablet Take 1 tablet by mouth daily at 12 noon. Patient not taking: Reported on 07/15/2022    [provider]    Family History Family History  Problem Relation Age of Onset   Hypertension Mother    Cancer Sister    Asthma Son    Diabetes Neg Hx    Heart disease Neg Hx    Stroke Neg Hx     Social History Social History   Tobacco Use   Smoking status: Never   Smokeless tobacco: Never  Vaping Use   Vaping status: Never Used  Substance Use Topics   Alcohol use: No   Drug  use: No     Allergies   Cherry   Review of Systems Review of Systems  Constitutional:  Negative for chills and fever.  Eyes:  Negative for discharge and redness.  Respiratory:  Negative for shortness of breath.   Gastrointestinal:  Negative for abdominal pain, nausea and vomiting.  Genitourinary:  Positive for vaginal discharge. Negative for genital sores and vaginal bleeding.     Physical Exam Triage Vital Signs ED Triage Vitals  Encounter Vitals Group     BP 12/27/22 1350 110/76     Systolic BP Percentile --      Diastolic BP Percentile --      Pulse Rate 12/27/22 1350 85     Resp 12/27/22 1350 18     Temp 12/27/22 1350 99 F (37.2 C)     Temp Source 12/27/22 1350 Oral     SpO2 12/27/22 1350 97 %     Weight 12/27/22 1351 121 lb (54.9 kg)     Height 12/27/22 1351 5\' 2"  (1.575 m)     Head Circumference --      Peak Flow --      Pain Score 12/27/22 1351 4     Pain Loc --      Pain Education --      Exclude from Growth Chart --    No data found.  Updated Vital Signs BP  110/76 (BP Location: Left Arm)   Pulse 85   Temp 99 F (37.2 C) (Oral)   Resp 18   Ht 5\' 2"  (1.575 m)   Wt 121 lb (54.9 kg)   SpO2 97%   Breastfeeding No   BMI 22.13 kg/m     Physical Exam Vitals and nursing note reviewed.  Constitutional:      General: She is not in acute distress.    Appearance: Normal appearance. She is not ill-appearing.  HENT:     Head: Normocephalic and atraumatic.  Eyes:     Conjunctiva/sclera: Conjunctivae normal.  Cardiovascular:     Rate and Rhythm: Normal rate.  Pulmonary:     Effort: Pulmonary effort is normal. No respiratory distress.  Neurological:     Mental Status: She is alert.  Psychiatric:        Mood and Affect: Mood normal.        Behavior: Behavior normal.        Thought Content: Thought content normal.      UC Treatments / Results  Labs (all labs ordered are listed, but only abnormal results are displayed) Labs Reviewed  HIV ANTIBODY (ROUTINE TESTING W REFLEX)  RPR  HEPATITIS PANEL, ACUTE  CERVICOVAGINAL ANCILLARY ONLY    EKG   Radiology No results found.  Procedures Procedures (including critical care time)  Medications Ordered in UC Medications - No data to display  Initial Impression / Assessment and Plan / UC Course  I have reviewed the triage vital signs and the nursing notes.  Pertinent labs & imaging results that were available during my care of the patient were reviewed by me and considered in my medical decision making (see chart for details).    STD screening ordered.  Will await results further recommendation.  Encouraged follow-up if no gradual improvement with any further concerns.  Final Clinical Impressions(s) / UC Diagnoses   Final diagnoses:  Screening for STD (sexually transmitted disease)   Discharge Instructions   None    ED Prescriptions   None    PDMP not reviewed this encounter.  Tomi Bamberger, PA-C 12/27/22 1425

## 2022-12-27 NOTE — ED Triage Notes (Signed)
Patient states she just found out that her boyfriend was cheating and would like STI testing.  She is having a vaginal discharge w/a mild odor no color.  Requesting bloodwork.

## 2022-12-28 ENCOUNTER — Telehealth: Payer: Self-pay

## 2022-12-28 LAB — CERVICOVAGINAL ANCILLARY ONLY
Bacterial Vaginitis (gardnerella): POSITIVE — AB
Candida Glabrata: NEGATIVE
Candida Vaginitis: NEGATIVE
Chlamydia: NEGATIVE
Comment: NEGATIVE
Comment: NEGATIVE
Comment: NEGATIVE
Comment: NEGATIVE
Comment: NEGATIVE
Comment: NORMAL
Neisseria Gonorrhea: NEGATIVE
Trichomonas: NEGATIVE

## 2022-12-28 MED ORDER — METRONIDAZOLE 500 MG PO TABS: 500.0000 mg | ORAL_TABLET | Freq: Two times a day (BID) | ORAL | 0 refills | Status: AC

## 2022-12-28 NOTE — Telephone Encounter (Signed)
Per protocol, pt requires tx with metronidazole. Reviewed with patient, verified pharmacy, prescription sent.

## 2022-12-30 LAB — HIV ANTIBODY (ROUTINE TESTING W REFLEX): HIV Screen 4th Generation wRfx: NONREACTIVE

## 2022-12-30 LAB — RPR: RPR Ser Ql: NONREACTIVE

## 2023-01-31 NOTE — Progress Notes (Deleted)
[name] here for Depo-Provera Injection. Patient received last injection on 11/16/22; patient is 11w since last injection--within window to receive next dose today. Injection administered without complication. Patient will return in 3 months for next injection between 04/19/23 and 05/03/23. Next annual visit due 07/15/23.   Meryl Crutch, RN 02/01/23 at

## 2023-02-01 ENCOUNTER — Ambulatory Visit: Payer: Medicaid Other

## 2023-02-21 ENCOUNTER — Ambulatory Visit: Payer: Medicaid Other

## 2023-03-03 ENCOUNTER — Ambulatory Visit: Payer: Medicaid Other

## 2023-03-03 ENCOUNTER — Other Ambulatory Visit: Payer: Self-pay

## 2023-03-03 VITALS — BP 99/56 | HR 87 | Ht 62.0 in | Wt 109.0 lb

## 2023-03-03 DIAGNOSIS — Z3202 Encounter for pregnancy test, result negative: Secondary | ICD-10-CM | POA: Diagnosis not present

## 2023-03-03 DIAGNOSIS — Z3042 Encounter for surveillance of injectable contraceptive: Secondary | ICD-10-CM | POA: Diagnosis not present

## 2023-03-03 LAB — POCT PREGNANCY, URINE: Preg Test, Ur: NEGATIVE

## 2023-03-03 MED ORDER — MEDROXYPROGESTERONE ACETATE 150 MG/ML IM SUSY
150.0000 mg | PREFILLED_SYRINGE | Freq: Once | INTRAMUSCULAR | Status: AC
  Administered 2023-03-03: 150 mg via INTRAMUSCULAR

## 2023-03-03 NOTE — Progress Notes (Addendum)
[name] here for Depo-Provera Injection. Last depo injection was on 11/16/22. Based on protocol, UPT obtained. UPT was negative. Patient denies unprotected intercourse in the last 2 weeks.  Injection administered without complication. Advised patient to use back up method of contraception for 2 weeks. Patient will return in 3 months for next injection between 3/13 and 3/27. Next annual visit due May 2025.   Quintella Reichert, RN 03/03/2023  3:36 PM

## 2023-05-19 ENCOUNTER — Ambulatory Visit: Payer: Medicaid Other

## 2023-08-30 ENCOUNTER — Encounter: Payer: Self-pay | Admitting: Certified Nurse Midwife

## 2023-11-21 ENCOUNTER — Other Ambulatory Visit: Payer: Self-pay

## 2023-11-21 ENCOUNTER — Encounter: Payer: Self-pay | Admitting: Obstetrics and Gynecology

## 2023-11-21 ENCOUNTER — Ambulatory Visit (INDEPENDENT_AMBULATORY_CARE_PROVIDER_SITE_OTHER): Admitting: Obstetrics and Gynecology

## 2023-11-21 ENCOUNTER — Other Ambulatory Visit (HOSPITAL_COMMUNITY)
Admission: RE | Admit: 2023-11-21 | Discharge: 2023-11-21 | Disposition: A | Discharge status: Home / Self Care | Source: Ambulatory Visit | Attending: Family Medicine | Admitting: Family Medicine

## 2023-11-21 VITALS — BP 117/65 | HR 99 | Wt 101.4 lb

## 2023-11-21 DIAGNOSIS — Z01419 Encounter for gynecological examination (general) (routine) without abnormal findings: Secondary | ICD-10-CM

## 2023-11-21 DIAGNOSIS — Z3202 Encounter for pregnancy test, result negative: Secondary | ICD-10-CM

## 2023-11-21 DIAGNOSIS — Z1331 Encounter for screening for depression: Secondary | ICD-10-CM

## 2023-11-21 DIAGNOSIS — Z113 Encounter for screening for infections with a predominantly sexual mode of transmission: Secondary | ICD-10-CM | POA: Diagnosis not present

## 2023-11-21 DIAGNOSIS — Z23 Encounter for immunization: Secondary | ICD-10-CM | POA: Diagnosis not present

## 2023-11-21 DIAGNOSIS — Z3042 Encounter for surveillance of injectable contraceptive: Secondary | ICD-10-CM

## 2023-11-21 LAB — POCT PREGNANCY, URINE: Preg Test, Ur: NEGATIVE

## 2023-11-21 MED ORDER — MEDROXYPROGESTERONE ACETATE 150 MG/ML IM SUSP
150.0000 mg | Freq: Once | INTRAMUSCULAR | Status: AC
  Administered 2023-11-21: 150 mg via INTRAMUSCULAR

## 2023-11-21 NOTE — Progress Notes (Signed)
 ANNUAL EXAM Patient name: [name] MRN [id number]  Date of birth: 08-21-1990 Chief Complaint:   No chief complaint on file.  History of Present Illness:   [name] is a 33 y.o. [phone]  female being seen today for a routine annual exam.  Current complaints: desires restart depo provera , last dose 03/03/23. Denies pelvic or vaginal complaints  Patient's last menstrual period was 10/29/2023 (exact date).   The pregnancy intention screening data noted above was reviewed. Potential methods of contraception were discussed. The patient elected to proceed with No data recorded.   Gynecologic History Patient's last menstrual period was . Contraception:  Last Pap: 2023. Results were: NILM Last mammogram: n/a     11/21/2023    1:54 PM 12/07/2021    9:53 AM 02/12/2021    1:45 PM 07/13/2017    3:47 PM 07/06/2017    3:26 PM  Depression screen PHQ 2/9  Decreased Interest 0 0 0 1 1  Down, Depressed, Hopeless 0 0 0 0 0  PHQ - 2 Score 0 0 0 1 1  Altered sleeping 0 0 0 1 1  Tired, decreased energy 0 0 0 1 1  Change in appetite 0 0 1 0 0  Feeling bad or failure about yourself  0 0 0 0 0  Trouble concentrating 0 0 0 0 0  Moving slowly or fidgety/restless 0 0 0 0 0  Suicidal thoughts 0 0 0 0 0  PHQ-9 Score 0 0 1 3 3         11/21/2023    1:54 PM 12/07/2021    9:54 AM 02/12/2021    1:45 PM 07/13/2017    3:47 PM  GAD 7 : Generalized Anxiety Score  Nervous, Anxious, on Edge 0 0 0 0  Control/stop worrying 0 0 0 0  Worry too much - different things 0 0 0 0  Trouble relaxing 0 0 0 1  Restless 0 0 0 0  Easily annoyed or irritable 0 0 0 0  Afraid - awful might happen 0 0 0 0  Total GAD 7 Score 0 0 0 1     Review of Systems:   Pertinent items are noted in HPI Denies any headaches, blurred vision, fatigue, shortness of breath, chest pain, abdominal pain, abnormal vaginal discharge/itching/odor/irritation, problems with periods, bowel movements, urination, or intercourse unless  otherwise stated above. Pertinent History Reviewed:  Reviewed past medical,surgical, social and family history.  Reviewed problem list, medications and allergies. Physical Assessment:   Vitals:   11/21/23 1355  BP: 117/65  Pulse: 99  Weight: 101 lb 6.4 oz (46 kg)  Body mass index is 18.55 kg/m.        Physical Examination:   General appearance - well appearing, and in no distress  Mental status - alert, oriented   Psych:  She has a normal mood and affect  Skin - warm and dry, normal color  Chest - effort normal, all lung fields clear to auscultation bilaterally  Heart - normal rate and regular rhythm  Neck:  midline trachea, no thyromegaly or nodules  Breasts - declined  Abdomen - soft, nontender, nondistended  Pelvic - deferred  Extremities:  No swelling or varicosities noted  Chaperone present for exam  No results found for this or any previous visit (from the past 24 hours).  Assessment & Plan:  1. Encounter for annual routine gynecological examination (Primary) UTD on pap Mammogram at 89 Believes she has had HPV vaccination before  2. Encounter for Depo-Provera  contraception Desires to restart today UPT neg Discussed following up every three months Discussed expected irregular bleeding for first 3-6 months  3. Screen for STD (sexually transmitted disease) - Cervicovaginal ancillary only( [place]) - RPR+HBsAg+HCVAb+...  4. Need for influenza vaccination  - Flu vaccine trivalent PF, 6mos and older(Flulaval,Afluria,Fluarix,Fluzone)   Labs/procedures today:   Mammogram: @ 31yo, or sooner if problems Colonoscopy: @ 31yo, or sooner if problems  No orders of the defined types were placed in this encounter.   Meds: No orders of the defined types were placed in this encounter.   Follow-up: Return 3 month nurse visit depo injection, and one year annual.  Nidia Daring, FNP

## 2023-11-22 ENCOUNTER — Ambulatory Visit: Payer: Self-pay | Admitting: Obstetrics and Gynecology

## 2023-11-22 LAB — RPR+HBSAG+HCVAB+...
HIV Screen 4th Generation wRfx: NONREACTIVE
Hep C Virus Ab: NONREACTIVE
Hepatitis B Surface Ag: NEGATIVE
RPR Ser Ql: NONREACTIVE

## 2023-11-22 LAB — CERVICOVAGINAL ANCILLARY ONLY
Chlamydia: NEGATIVE
Comment: NEGATIVE
Comment: NEGATIVE
Comment: NORMAL
Neisseria Gonorrhea: NEGATIVE
Trichomonas: NEGATIVE

## 2024-02-06 ENCOUNTER — Ambulatory Visit

## 2024-02-06 ENCOUNTER — Encounter: Payer: Self-pay | Admitting: Student

## 2024-02-15 ENCOUNTER — Ambulatory Visit

## 2024-03-23 ENCOUNTER — Telehealth: Admitting: Physician Assistant

## 2024-03-23 DIAGNOSIS — J069 Acute upper respiratory infection, unspecified: Secondary | ICD-10-CM

## 2024-03-23 DIAGNOSIS — B001 Herpesviral vesicular dermatitis: Secondary | ICD-10-CM

## 2024-03-23 MED ORDER — VALACYCLOVIR HCL 1 G PO TABS: 2000.0000 mg | ORAL_TABLET | Freq: Two times a day (BID) | ORAL | 0 refills | Status: AC

## 2024-03-23 MED ORDER — BENZONATATE 100 MG PO CAPS: 100.0000 mg | ORAL_CAPSULE | Freq: Three times a day (TID) | ORAL | 0 refills | Status: AC | PRN

## 2024-03-23 NOTE — Progress Notes (Signed)
 Message sent to patient, awaiting response.

## 2024-03-23 NOTE — Progress Notes (Signed)
 We are sorry that you are not feeling well.  Here is how we plan to help!  Based on your symptoms, it appears you may have a viral infection.   Cold sores, also known as fever blisters, are small, fluid-filled blisters that typically appear around the mouth. They are caused by the herpes simplex virus, most commonly herpes simplex virus type 1 (HSV-1). The virus spreads through direct skin contact, as well as by sharing items like eating utensils, lip balms, or towels.   Cold sores are contagious until they dry out, which usually takes about 5 to 7 days. Its important to wash your hands frequently, especially after touching the affected area. If you wear contact lenses, avoid handling them after touching a cold sore, as the virus can spread to your eyes and cause complications.   Most people experience pain at the site or tingling sensations in their lips that may begin before the ulcers erupt.  Herpes simplex is treatable but not curable.  It may lie dormant for a long time and then reappear due to stress or prolonged sun exposure.  Many patients have success in treating their cold sores with an over the counter topical called Abreva.  You may apply the cream up to 5 times daily (maximum 10 days) until healing occurs.  To help speed the healing of your cold sore, I have prescribed Valacyclovir  1000 mg -- Take two pills by mouth twice a day for 1 day    HOME CARE:  Wash your hands frequently. Do not pick at or rub the sore. Don't pop the blisters. Avoid kissing other people during this time. Avoid sharing drinking glasses, eating utensils, or razors. Do not handle contact lenses unless you have thoroughly washed your hands with soap and warm water! Avoid oral sex during this time.  Herpes from sores on your mouth can spread to your partner's genital area. Avoid contact with anyone who has eczema or a weakened immune system. Cold sores are often triggered by exposure to intense sunlight,  use a lip balm containing a sunscreen (SPF 30 or higher).  GET HELP RIGHT AWAY IF:  Blisters look infected -- increased redness around the site, warmth of skin, drainage of pus from around the area. Blisters occur near or in the eye. Symptoms last longer than 10 days. Your symptoms worsen.  MAKE SURE YOU:  Understand these instructions. Will watch your condition. Will get help right away if you are not doing well or get worse.  Your e-visit answers were reviewed by a board certified advanced clinical practitioner to complete your personal care plan.  Depending upon the condition, your plan could have included both over the counter or prescription medications.     Please review your pharmacy choice.  Be sure that the pharmacy you have chosen is open so that you can pick up your prescription now.  If there is a problem, you can message your provider in MyChart to have the prescription routed to another pharmacy.     Your safety is important to us .  If you have drug allergies, check our prescription carefully.   For the next 24 hours you can use MyChart to ask questions about today's visit, request a non-urgent call back, or ask for a work or school excuse from your e-visit provider.   You will receive an email in the next two days asking about your experience.  I hope that your e-visit has been valuable and will speed up your recovery.  I have spent 5 minutes in review of e-visit questionnaire, review and updating patient chart, medical decision making and response to patient.   Teena Shuck, PA-C

## 2024-03-23 NOTE — Progress Notes (Addendum)
 We are sorry that you are not feeling well.  Here is how we plan to help!  Based on your presentation I believe you most likely have A cough due to a virus.  This is called viral bronchitis and is best treated by rest, plenty of fluids and control of the cough.  You may use Ibuprofen or Tylenol  as directed to help your symptoms.     In addition you may use A prescription cough medication called Tessalon  Perles 100mg . You may take 1-2 capsules every 8 hours as needed for your cough.   From your responses in the eVisit questionnaire you describe inflammation in the upper respiratory tract which is causing a significant cough.  This is commonly called Bronchitis and has four common causes:   Allergies Viral Infections Acid Reflux Bacterial Infection Allergies, viruses and acid reflux are treated by controlling symptoms or eliminating the cause. An example might be a cough caused by taking certain blood pressure medications. You stop the cough by changing the medication. Another example might be a cough caused by acid reflux. Controlling the reflux helps control the cough.  USE OF BRONCHODILATOR (RESCUE) INHALERS: There is a risk from using your bronchodilator too frequently.  The risk is that over-reliance on a medication which only relaxes the muscles surrounding the breathing tubes can reduce the effectiveness of medications prescribed to reduce swelling and congestion of the tubes themselves.  Although you feel brief relief from the bronchodilator inhaler, your asthma may actually be worsening with the tubes becoming more swollen and filled with mucus.  This can delay other crucial treatments, such as oral steroid medications. If you need to use a bronchodilator inhaler daily, several times per day, you should discuss this with your provider.  There are probably better treatments that could be used to keep your asthma under control.     HOME CARE Only take medications as instructed by your  medical team. Complete the entire course of an antibiotic. Drink plenty of fluids and get plenty of rest. Avoid close contacts especially the very young and the elderly Cover your mouth if you cough or cough into your sleeve. Always remember to wash your hands A steam or ultrasonic humidifier can help congestion.   GET HELP RIGHT AWAY IF: You develop worsening fever. You become short of breath You cough up blood. Your symptoms persist after you have completed your treatment plan MAKE SURE YOU  Understand these instructions. Will watch your condition. Will get help right away if you are not doing well or get worse.  Your e-visit answers were reviewed by a board certified advanced clinical practitioner to complete your personal care plan.  Depending on the condition, your plan could have included both over the counter or prescription medications. If there is a problem please reply  once you have received a response from your provider. Your safety is important to us .  If you have drug allergies check your prescription carefully.    You can use MyChart to ask questions about today's visit, request a non-urgent call back, or ask for a work or school excuse for 24 hours related to this e-Visit. If it has been greater than 24 hours you will need to follow up with your provider, or enter a new e-Visit to address those concerns. You will get an e-mail in the next two days asking about your experience.  I hope that your e-visit has been valuable and will speed your recovery. Thank you for using e-visits.  I have spent 5 minutes in review of e-visit questionnaire, review and updating patient chart, medical decision making and response to patient.   Kolbie Lepkowski, PA-C
